# Patient Record
Sex: Female | Born: 1945 | Race: Black or African American | Hispanic: No | Marital: Married | Smoking: Never smoker
Health system: Southern US, Community
[De-identification: ages and names within clinical notes are randomized; demographics above are authoritative.]

## PROBLEM LIST (undated history)

## (undated) DIAGNOSIS — J069 Acute upper respiratory infection, unspecified: Secondary | ICD-10-CM

## (undated) DIAGNOSIS — Z9989 Dependence on other enabling machines and devices: Secondary | ICD-10-CM

## (undated) DIAGNOSIS — K469 Unspecified abdominal hernia without obstruction or gangrene: Secondary | ICD-10-CM

## (undated) DIAGNOSIS — J383 Other diseases of vocal cords: Secondary | ICD-10-CM

## (undated) DIAGNOSIS — D649 Anemia, unspecified: Secondary | ICD-10-CM

## (undated) DIAGNOSIS — F419 Anxiety disorder, unspecified: Secondary | ICD-10-CM

## (undated) DIAGNOSIS — E119 Type 2 diabetes mellitus without complications: Secondary | ICD-10-CM

## (undated) DIAGNOSIS — U071 COVID-19: Secondary | ICD-10-CM

## (undated) DIAGNOSIS — E785 Hyperlipidemia, unspecified: Secondary | ICD-10-CM

## (undated) DIAGNOSIS — E079 Disorder of thyroid, unspecified: Secondary | ICD-10-CM

## (undated) DIAGNOSIS — K219 Gastro-esophageal reflux disease without esophagitis: Secondary | ICD-10-CM

## (undated) DIAGNOSIS — J45909 Unspecified asthma, uncomplicated: Secondary | ICD-10-CM

## (undated) DIAGNOSIS — G4733 Obstructive sleep apnea (adult) (pediatric): Secondary | ICD-10-CM

## (undated) DIAGNOSIS — I509 Heart failure, unspecified: Secondary | ICD-10-CM

## (undated) DIAGNOSIS — I10 Essential (primary) hypertension: Secondary | ICD-10-CM

## (undated) HISTORY — PX: BREAST RECONSTRUCTION: SHX9

## (undated) HISTORY — DX: Gastro-esophageal reflux disease without esophagitis: K21.9

## (undated) HISTORY — DX: COVID-19: U07.1

## (undated) HISTORY — PX: HERNIA REPAIR: SHX51

## (undated) HISTORY — PX: BREAST SURGERY: SHX581

## (undated) HISTORY — DX: Disorder of thyroid, unspecified: E07.9

## (undated) HISTORY — DX: Hyperlipidemia, unspecified: E78.5

## (undated) HISTORY — DX: Unspecified abdominal hernia without obstruction or gangrene: K46.9

## (undated) HISTORY — PX: OTHER SURGICAL HISTORY: SHX169

## (undated) HISTORY — PX: NISSEN FUNDOPLICATION: SHX2091

## (undated) HISTORY — DX: Dependence on other enabling machines and devices: Z99.89

## (undated) HISTORY — DX: Heart failure, unspecified: I50.9

## (undated) HISTORY — DX: Unspecified asthma, uncomplicated: J45.909

## (undated) HISTORY — PX: DILATION AND CURETTAGE OF UTERUS: SHX78

## (undated) HISTORY — PX: ENDOMETRIAL BIOPSY: SHX622

## (undated) HISTORY — PX: HYSTEROSCOPY: SHX211

## (undated) HISTORY — DX: Anxiety disorder, unspecified: F41.9

## (undated) HISTORY — DX: Anemia, unspecified: D64.9

## (undated) HISTORY — DX: Obstructive sleep apnea (adult) (pediatric): G47.33

## (undated) HISTORY — DX: Other diseases of vocal cords: J38.3

## (undated) HISTORY — PX: KNEE SURGERY: SHX244

## (undated) HISTORY — PX: OOPHORECTOMY: SHX86

## (undated) HISTORY — DX: Acute upper respiratory infection, unspecified: J06.9

## (undated) HISTORY — DX: Essential (primary) hypertension: I10

---

## 1998-10-13 ENCOUNTER — Encounter: Payer: Self-pay | Admitting: Obstetrics and Gynecology

## 1998-10-13 ENCOUNTER — Other Ambulatory Visit: Admission: RE | Admit: 1998-10-13 | Discharge: 1998-10-13 | Payer: Self-pay | Admitting: Obstetrics and Gynecology

## 1998-10-13 ENCOUNTER — Ambulatory Visit (HOSPITAL_COMMUNITY): Admission: RE | Admit: 1998-10-13 | Discharge: 1998-10-13 | Payer: Self-pay | Admitting: Obstetrics and Gynecology

## 1999-10-05 ENCOUNTER — Other Ambulatory Visit: Admission: RE | Admit: 1999-10-05 | Discharge: 1999-10-05 | Payer: Self-pay | Admitting: Obstetrics and Gynecology

## 1999-12-01 ENCOUNTER — Ambulatory Visit (HOSPITAL_COMMUNITY): Admission: RE | Admit: 1999-12-01 | Discharge: 1999-12-01 | Payer: Self-pay | Admitting: Obstetrics and Gynecology

## 1999-12-01 ENCOUNTER — Encounter: Payer: Self-pay | Admitting: Obstetrics and Gynecology

## 1999-12-15 ENCOUNTER — Encounter (INDEPENDENT_AMBULATORY_CARE_PROVIDER_SITE_OTHER): Payer: Self-pay | Admitting: Specialist

## 1999-12-15 ENCOUNTER — Other Ambulatory Visit: Admission: RE | Admit: 1999-12-15 | Discharge: 1999-12-15 | Payer: Self-pay | Admitting: Obstetrics and Gynecology

## 2000-11-23 ENCOUNTER — Ambulatory Visit (HOSPITAL_COMMUNITY): Admission: RE | Admit: 2000-11-23 | Discharge: 2000-11-23 | Payer: Self-pay | Admitting: Family Medicine

## 2000-12-13 ENCOUNTER — Encounter: Admission: RE | Admit: 2000-12-13 | Discharge: 2000-12-13 | Payer: Self-pay | Admitting: Obstetrics and Gynecology

## 2000-12-13 ENCOUNTER — Other Ambulatory Visit: Admission: RE | Admit: 2000-12-13 | Discharge: 2000-12-13 | Payer: Self-pay | Admitting: Obstetrics and Gynecology

## 2000-12-13 ENCOUNTER — Encounter: Payer: Self-pay | Admitting: Obstetrics and Gynecology

## 2001-12-11 ENCOUNTER — Other Ambulatory Visit: Admission: RE | Admit: 2001-12-11 | Discharge: 2001-12-11 | Payer: Self-pay | Admitting: Obstetrics and Gynecology

## 2001-12-13 ENCOUNTER — Encounter: Admission: RE | Admit: 2001-12-13 | Discharge: 2001-12-13 | Payer: Self-pay | Admitting: Obstetrics and Gynecology

## 2001-12-13 ENCOUNTER — Encounter: Payer: Self-pay | Admitting: Obstetrics and Gynecology

## 2003-01-22 ENCOUNTER — Other Ambulatory Visit: Admission: RE | Admit: 2003-01-22 | Discharge: 2003-01-22 | Payer: Self-pay | Admitting: Obstetrics and Gynecology

## 2003-01-22 ENCOUNTER — Encounter: Admission: RE | Admit: 2003-01-22 | Discharge: 2003-01-22 | Payer: Self-pay | Admitting: Obstetrics and Gynecology

## 2003-01-22 ENCOUNTER — Encounter: Payer: Self-pay | Admitting: Obstetrics and Gynecology

## 2003-09-16 ENCOUNTER — Encounter (INDEPENDENT_AMBULATORY_CARE_PROVIDER_SITE_OTHER): Payer: Self-pay | Admitting: Specialist

## 2003-09-16 ENCOUNTER — Ambulatory Visit (HOSPITAL_COMMUNITY): Admission: RE | Admit: 2003-09-16 | Discharge: 2003-09-16 | Payer: Self-pay | Admitting: Obstetrics and Gynecology

## 2004-01-24 ENCOUNTER — Encounter: Admission: RE | Admit: 2004-01-24 | Discharge: 2004-01-24 | Payer: Self-pay | Admitting: Obstetrics and Gynecology

## 2004-01-28 ENCOUNTER — Other Ambulatory Visit: Admission: RE | Admit: 2004-01-28 | Discharge: 2004-01-28 | Payer: Self-pay | Admitting: Obstetrics and Gynecology

## 2004-04-10 ENCOUNTER — Encounter: Payer: Self-pay | Admitting: Internal Medicine

## 2004-05-01 ENCOUNTER — Encounter (INDEPENDENT_AMBULATORY_CARE_PROVIDER_SITE_OTHER): Payer: Self-pay | Admitting: Specialist

## 2004-05-01 ENCOUNTER — Ambulatory Visit: Payer: Self-pay | Admitting: Internal Medicine

## 2004-05-01 ENCOUNTER — Ambulatory Visit (HOSPITAL_COMMUNITY): Admission: RE | Admit: 2004-05-01 | Discharge: 2004-05-01 | Payer: Self-pay | Admitting: Internal Medicine

## 2005-02-02 ENCOUNTER — Encounter: Admission: RE | Admit: 2005-02-02 | Discharge: 2005-02-02 | Payer: Self-pay | Admitting: Obstetrics and Gynecology

## 2005-02-08 ENCOUNTER — Other Ambulatory Visit: Admission: RE | Admit: 2005-02-08 | Discharge: 2005-02-08 | Payer: Self-pay | Admitting: Obstetrics and Gynecology

## 2006-01-10 ENCOUNTER — Encounter: Admission: RE | Admit: 2006-01-10 | Discharge: 2006-01-10 | Payer: Self-pay | Admitting: Obstetrics and Gynecology

## 2006-03-08 ENCOUNTER — Other Ambulatory Visit: Admission: RE | Admit: 2006-03-08 | Discharge: 2006-03-08 | Payer: Self-pay | Admitting: Obstetrics and Gynecology

## 2007-02-08 ENCOUNTER — Encounter: Admission: RE | Admit: 2007-02-08 | Discharge: 2007-02-08 | Payer: Self-pay | Admitting: Obstetrics and Gynecology

## 2007-03-15 ENCOUNTER — Other Ambulatory Visit: Admission: RE | Admit: 2007-03-15 | Discharge: 2007-03-15 | Payer: Self-pay | Admitting: Obstetrics and Gynecology

## 2007-03-28 DIAGNOSIS — G4733 Obstructive sleep apnea (adult) (pediatric): Secondary | ICD-10-CM

## 2007-03-28 HISTORY — DX: Obstructive sleep apnea (adult) (pediatric): G47.33

## 2007-11-07 ENCOUNTER — Encounter: Admission: RE | Admit: 2007-11-07 | Discharge: 2007-11-07 | Payer: Self-pay | Admitting: Family Medicine

## 2007-12-15 ENCOUNTER — Emergency Department (HOSPITAL_COMMUNITY): Admission: EM | Admit: 2007-12-15 | Discharge: 2007-12-15 | Payer: Self-pay | Admitting: Emergency Medicine

## 2008-02-09 ENCOUNTER — Encounter: Admission: RE | Admit: 2008-02-09 | Discharge: 2008-02-09 | Payer: Self-pay | Admitting: Obstetrics and Gynecology

## 2008-03-20 ENCOUNTER — Other Ambulatory Visit: Admission: RE | Admit: 2008-03-20 | Discharge: 2008-03-20 | Payer: Self-pay | Admitting: Obstetrics and Gynecology

## 2008-03-20 ENCOUNTER — Encounter: Payer: Self-pay | Admitting: Obstetrics and Gynecology

## 2008-03-20 ENCOUNTER — Ambulatory Visit: Payer: Self-pay | Admitting: Obstetrics and Gynecology

## 2008-04-01 ENCOUNTER — Ambulatory Visit: Payer: Self-pay | Admitting: Obstetrics and Gynecology

## 2008-04-16 ENCOUNTER — Ambulatory Visit: Payer: Self-pay | Admitting: Obstetrics and Gynecology

## 2008-05-13 ENCOUNTER — Ambulatory Visit: Payer: Self-pay | Admitting: Obstetrics and Gynecology

## 2008-06-27 ENCOUNTER — Ambulatory Visit: Payer: Self-pay | Admitting: Obstetrics and Gynecology

## 2008-11-13 ENCOUNTER — Ambulatory Visit: Payer: Self-pay | Admitting: Women's Health

## 2009-01-14 ENCOUNTER — Ambulatory Visit: Payer: Self-pay | Admitting: Obstetrics and Gynecology

## 2009-01-20 ENCOUNTER — Ambulatory Visit: Payer: Self-pay | Admitting: Obstetrics and Gynecology

## 2009-02-17 ENCOUNTER — Encounter: Admission: RE | Admit: 2009-02-17 | Discharge: 2009-02-17 | Payer: Self-pay | Admitting: Internal Medicine

## 2009-03-24 ENCOUNTER — Encounter: Payer: Self-pay | Admitting: Obstetrics and Gynecology

## 2009-03-24 ENCOUNTER — Other Ambulatory Visit: Admission: RE | Admit: 2009-03-24 | Discharge: 2009-03-24 | Payer: Self-pay | Admitting: Obstetrics and Gynecology

## 2009-03-24 ENCOUNTER — Ambulatory Visit: Payer: Self-pay | Admitting: Obstetrics and Gynecology

## 2009-04-07 ENCOUNTER — Encounter (INDEPENDENT_AMBULATORY_CARE_PROVIDER_SITE_OTHER): Payer: Self-pay | Admitting: *Deleted

## 2009-04-10 ENCOUNTER — Telehealth: Payer: Self-pay | Admitting: Internal Medicine

## 2009-04-14 ENCOUNTER — Encounter (INDEPENDENT_AMBULATORY_CARE_PROVIDER_SITE_OTHER): Payer: Self-pay

## 2009-05-02 ENCOUNTER — Encounter (INDEPENDENT_AMBULATORY_CARE_PROVIDER_SITE_OTHER): Payer: Self-pay | Admitting: *Deleted

## 2009-05-02 ENCOUNTER — Ambulatory Visit: Payer: Self-pay | Admitting: Internal Medicine

## 2009-05-09 ENCOUNTER — Ambulatory Visit: Payer: Self-pay | Admitting: Internal Medicine

## 2009-05-09 ENCOUNTER — Ambulatory Visit (HOSPITAL_COMMUNITY): Admission: RE | Admit: 2009-05-09 | Discharge: 2009-05-09 | Payer: Self-pay | Admitting: Internal Medicine

## 2009-05-20 ENCOUNTER — Encounter: Payer: Self-pay | Admitting: Internal Medicine

## 2009-07-24 ENCOUNTER — Ambulatory Visit: Payer: Self-pay | Admitting: Obstetrics and Gynecology

## 2010-01-08 ENCOUNTER — Ambulatory Visit: Payer: Self-pay | Admitting: Obstetrics and Gynecology

## 2010-03-17 ENCOUNTER — Encounter: Admission: RE | Admit: 2010-03-17 | Discharge: 2010-03-17 | Payer: Self-pay | Admitting: Obstetrics and Gynecology

## 2010-03-25 ENCOUNTER — Ambulatory Visit: Payer: Self-pay | Admitting: Obstetrics and Gynecology

## 2010-03-30 ENCOUNTER — Ambulatory Visit: Payer: Self-pay | Admitting: Obstetrics and Gynecology

## 2010-07-27 ENCOUNTER — Ambulatory Visit: Payer: Medicare Other | Admitting: Gynecology

## 2010-07-27 DIAGNOSIS — N898 Other specified noninflammatory disorders of vagina: Secondary | ICD-10-CM

## 2010-07-27 DIAGNOSIS — B3731 Acute candidiasis of vulva and vagina: Secondary | ICD-10-CM

## 2010-07-27 DIAGNOSIS — K644 Residual hemorrhoidal skin tags: Secondary | ICD-10-CM

## 2010-07-27 DIAGNOSIS — B373 Candidiasis of vulva and vagina: Secondary | ICD-10-CM

## 2010-08-10 ENCOUNTER — Telehealth: Payer: Self-pay | Admitting: Internal Medicine

## 2010-08-18 NOTE — Progress Notes (Signed)
Summary: Abd Pain  Phone Note Call from Patient Call back at Home Phone [phone] Call back at cell [phone]   Call For: Dr Leone Payor Reason for Call: Talk to Nurse Summary of Call: Having abd pain & wonders if she can be see sooner that first avail 09-18-10. Initial call taken by: Leanor Kail [facility],  August 10, 2010 1:56 PM  Follow-up for Phone Call        patient is having continued problems with constipation.  She is taking laxatives with minimal results and daily abdominal pain.  She is rescehduled to see Dr Leone Payor for 08/26/10 Follow-up by: Darcey Nora RN, CGRN,  August 10, 2010 3:35 PM

## 2010-08-24 DIAGNOSIS — Z860100 Personal history of colon polyps, unspecified: Secondary | ICD-10-CM | POA: Insufficient documentation

## 2010-08-26 ENCOUNTER — Ambulatory Visit (INDEPENDENT_AMBULATORY_CARE_PROVIDER_SITE_OTHER): Payer: Medicare Other | Admitting: Internal Medicine

## 2010-08-26 ENCOUNTER — Encounter: Payer: Self-pay | Admitting: Internal Medicine

## 2010-08-26 DIAGNOSIS — R141 Gas pain: Secondary | ICD-10-CM | POA: Insufficient documentation

## 2010-08-26 DIAGNOSIS — R159 Full incontinence of feces: Secondary | ICD-10-CM

## 2010-08-26 DIAGNOSIS — R142 Eructation: Secondary | ICD-10-CM

## 2010-08-26 DIAGNOSIS — R143 Flatulence: Secondary | ICD-10-CM

## 2010-08-26 DIAGNOSIS — K5909 Other constipation: Secondary | ICD-10-CM

## 2010-09-01 NOTE — Assessment & Plan Note (Signed)
Summary: ABD PAIN/SCHED W-PT/LAST ROV 2005/MAILER SENT./CX FEE   History of Present Illness Visit Type: new patient  Primary GI MD: Stan Head MD [facility] Primary Provider: Gwenyth Bender, MD  Requesting Provider: na Chief Complaint: GERD  History of Present Illness:   65 yo African-American woman with inreased flatulence and a protrusion from the anal area noticed when she wipes. She also notices some seepage of stool when she laughs or walks. No urinary incontinence. She uses a stool softener to help with constipation (chronic) and stools are soft. Symptoms x 5 years  She eats a large amount of vegetables (cauliflower, cabbage, broccoli, peppers).  Using MiraLax has used fiber (Benefiber caused cramps) has used cit Mg also uses align   GI Review of Systems    Reports acid reflux and  bloating.      Denies abdominal pain, belching, chest pain, dysphagia with liquids, dysphagia with solids, loss of appetite, nausea, vomiting, vomiting blood, weight loss, and  weight gain.      Reports constipation and  rectal pain.     Denies anal fissure, black tarry stools, change in bowel habit, diarrhea, diverticulosis, fecal incontinence, heme positive stool, hemorrhoids, irritable bowel syndrome, jaundice, light color stool, liver problems, and  rectal bleeding.    Colonoscopy  Procedure date:  04/29/2009  Findings:       1) Four polyps removed with cold biopsy and snare. Largest was 6     mm. ADENOMAS     2) Otherwise normal examination, good prep     3) prior diminutive adenoma 2005  Comments:      Repeat colonoscopy in 3 years.      Current Medications (verified): 1)  Lanoxin 0.125 Mg Tabs (Digoxin) .... One Tablet By Mouth Once Daily 2)  Coreg Cr 40 Mg Xr24h-Cap (Carvedilol Phosphate) .... One Capsule By Mouth Once Daily 3)  Furosemide 40 Mg Tabs (Furosemide) .Marland Kitchen.. 1 Tablet By Mouth Once Daily and 1 1/2 Tablets By Mouth At Bedtime 4)  Spironolactone 25 Mg Tabs (Spironolactone)  .... 1/2 Tablet Two Times A Day By Mouth 5)  Diovan 160 Mg Tabs (Valsartan) .... One Tablet By Mouth Two Times A Day 6)  Nexium 40 Mg Cpdr (Esomeprazole Magnesium) .... One Tablet By Mouth Two Times A Day 7)  Lipitor 40 Mg Tabs (Atorvastatin Calcium) .... Two Tablet By Mouth Once Daily 8)  Zolpidem Tartrate 10 Mg Tabs (Zolpidem Tartrate) .... As Needed 9)  Singulair 10 Mg Tabs (Montelukast Sodium) .... Once Daily By Mouth 10)  Docusate Sodium 100 Mg Tabs (Docusate Sodium) .... One Tablet Three Times A Day By Mouth 11)  C-Pap .... As Needed 12)  Xyzal 5 Mg Tabs (Levocetirizine Dihydrochloride) .... One Tablet By Mouth Once Daily 13)  Oscal 500/200 D-3 500-200 Mg-Unit Tabs (Calcium Carbonate-Vitamin D) .... One Tablet By Mouth Once Daily 14)  Centrum Silver  Tabs (Multiple Vitamins-Minerals) .... One Tablet By Mouth Once Daily 15)  Vitamin D3 1000 Unit  Tabs (Cholecalciferol) .... One Tablet By Mouth Once Daily 16)  Vitamin B-12 1000 Mcg Tabs (Cyanocobalamin) .... One Tablet By Mouth Once Daily 17)  Protein Shake .... Once Daily  Allergies (verified): 1)  ! Sulfa 2)  ! Codeine 3)  ! Versed  Past History:  Past Medical History: Irritable Bowel Syndrome Umbilical hernia Adenomatous polyps 2005, 2010 GERD Hypertension Hyperlipidemia  Past Surgical History: Nissenfundoplication @ [facility] Vocal Cord Surgery  Thyroid Surgery Umbilical hernia repair  Family History: Family History of Prostate Cancer:  father No FH of Colon Cancer:  Social History: Retired Married Children Patient has never smoked.  Alcohol Use - no Illicit Drug Use - no Smoking Status:  never Drug Use:  no  Review of Systems       The patient complains of allergy/sinus, arthritis/joint pain, back pain, fatigue, heart murmur, and shortness of breath.         + anxiety also All other ROS negative except as per HPI.   Vital Signs:  Patient profile:   65 year old female Height:      65 inches Weight:       244 pounds BMI:     40.75 BSA:     2.15 Pulse rate:   72 / minute Pulse rhythm:   regular BP sitting:   132 / 78  (left arm) Cuff size:   regular  Vitals Entered By: Ok Anis CMA (August 26, 2010 1:49 PM)  Physical Exam  General:  obese, NAD Eyes:  anicteric Lungs:  Clear throughout to auscultation. Heart:  Regular rate and rhythm; no murmurs, rubs,  or bruits. Abdomen:  obese, soft and nontender BS+ healed scars Rectal:  FEMALE STAFF PRESENT small anterior tag decreased sphincter tone at rest (mild) intact anal wink normal squeeze and valsalva   Impression & Recommendations:  Problem # 1:  INTESTINAL GAS (ICD-787.3) Assessment New Seems like diet rich in gas-producing vegetables is playing a significant role. will have her follow gas reduction diet and add fiber supplement with MiraLax to reduce constipation problems. ? some  fundoplication effect but doubt that is primary as many years out from that and she does not remember gas bloat after that. Certainly could add to flatulence as opposed to belching. Note she has tried Librarian, academic intermittently and could do again. also uses Beano.  If diet changes unhelpful consider empiric antibiotic tx  Problem # 2:  FULL INCONTINENCE OF FECES (ICD-787.60) Assessment: New lax sphincter probably related to 4 births some with trauma small rectocele also see if metamucil supplementation and other diet changes don't help  Problem # 3:  CONSTIPATION, CHRONIC (ICD-564.09) Assessment: New stool softeners and Miralax used now. Try fiber and <iraLax while reducing cruciferous vegerables and other gas-forming foods  Patient Instructions: 1)  Take 1 teaspoon of metamucil daily x 1 week, then 2 teaspoons daily x 1 week then 3 teaspoons (1 tablespoon) daiy. May adjust dose as needed, up or down. 2)  Excessive Gas Diet handout given.  3)  Please schedule a follow-up appointment as needed, if the diet does not work.  4)  The medication list  was reviewed and reconciled.  All changed / newly prescribed medications were explained.  A complete medication list was provided to the patient / caregiver. cc Willey Blade, MD

## 2010-09-01 NOTE — Consult Note (Signed)
Summary: [facility]   Imported By: Sherian Rein 08/24/2010 13:13:41  _____________________________________________________________________  External Attachment:    Type:   Image     Comment:   External Document

## 2010-09-18 ENCOUNTER — Ambulatory Visit: Payer: Medicare Other | Admitting: Internal Medicine

## 2010-09-30 ENCOUNTER — Other Ambulatory Visit: Payer: Medicare Other

## 2010-09-30 ENCOUNTER — Ambulatory Visit (INDEPENDENT_AMBULATORY_CARE_PROVIDER_SITE_OTHER): Payer: Medicare Other | Admitting: Obstetrics and Gynecology

## 2010-09-30 DIAGNOSIS — D259 Leiomyoma of uterus, unspecified: Secondary | ICD-10-CM

## 2010-09-30 DIAGNOSIS — N852 Hypertrophy of uterus: Secondary | ICD-10-CM

## 2010-09-30 DIAGNOSIS — D251 Intramural leiomyoma of uterus: Secondary | ICD-10-CM

## 2010-09-30 DIAGNOSIS — N949 Unspecified condition associated with female genital organs and menstrual cycle: Secondary | ICD-10-CM

## 2010-11-06 NOTE — Op Note (Signed)
NAME:  Jamie Ayers, Jamie Ayers                         ACCOUNT NO.:  [id number]   MEDICAL RECORD NO.:  [id number]                   PATIENT TYPE:  AMB   LOCATION:  SDC                                  FACILITY:  [facility]   PHYSICIAN:  Daniel L. Eda Paschal, M.D.           DATE OF BIRTH:  1945-11-24   DATE OF PROCEDURE:  09/16/2003  DATE OF DISCHARGE:                                 OPERATIVE REPORT   PREOPERATIVE DIAGNOSIS:  Cervical growth.   POSTOPERATIVE DIAGNOSIS:  Cervical mucousy polyp and endometrial polyp.   OPERATION:  1. Excision of cervical polyp.  2. Hysteroscopy with excision of endometrial polyp.   SURGEON:  Daniel L. Eda Paschal, M.D.   ANESTHESIA:  Subarachnoid block.   FINDINGS:  At the time of surgery, the patient had a growth coming out of  her cervix.  It appeared to be coming from the mid portion of the internal  part of the cervix, about half way between the external and internal os.  As  it was excised, it drained some mucousy material, making it consistent with  a mucousy polyp.  At the time that it was removed and hysteroscopy was then  done to be sure that she did not have associated polyps in the uterus,  examination of the endometrial cavity with the hysteroscope revealed a small  polyp coming off at 12 o'clock, of about 0.5 cm.  Other than that,  examination of the entire endometrial cavity and endocervical cavity (once  the polyp had been removed) was entirely normal.   DESCRIPTION OF PROCEDURE:  The patient was taken to the operating room and  because of difficulty with intubation, she was given a subarachnoid block by  anesthesia.  Anesthesia was excellent.  She was then prepped and draped in  the usual sterile manner.  A single-toothed tenaculum was placed in the  anterior lip of the cervix.  An Allis clamp was placed on this polypoid  growth coming out of the os.  It was felt that it was coming from the mid  portion of the cervix.   Using a 0 Vicryl Endo  loop for hemostasis, this was placed around the base  of this and then the polyp was removed.  As the polyp was excised, some  mucousy material came out of it.  After this, the cervix was dilated to a  #25 Pratt dilator, and a hysteroscopic examination was done with the  diagnostic hysteroscope (using 3% Sorbitol to expand the intrauterine  cavity, and a camera for magnification.  There was a small polyp at 12  o'clock, so the cervix was then dilated to a #31 Pratt dilator.  A  hysteroscopic resectoscope was introduced, with a 90-degree wire loop;  settings were 70 coag 110 cutting, blend 1 -- and the polyp was excised and  sent to pathology for tissue diagnosis.   Bleeding for the entire procedure was minimal.  She did not  bleed from the  base where the 0 Vicryl Endo loop had been placed.  Fluid deficit appeared  to be excessive; it was 500 cc.  There was no evidence of a perforation, and  at  the termination of the procedure the patient was lucid without any evidence  that she was getting intravenous 3% Sorbitol.   She will be watched carefully in the recovery room.  She left the operating  room in satisfactory condition.                                               Daniel L. Eda Paschal, M.D.    Tonette Bihari  D:  09/16/2003  T:  09/16/2003  Job:  [id number]

## 2010-12-29 ENCOUNTER — Ambulatory Visit (INDEPENDENT_AMBULATORY_CARE_PROVIDER_SITE_OTHER): Payer: Medicare Other | Admitting: Obstetrics and Gynecology

## 2010-12-29 ENCOUNTER — Other Ambulatory Visit: Payer: Self-pay | Admitting: Obstetrics and Gynecology

## 2010-12-29 DIAGNOSIS — N951 Menopausal and female climacteric states: Secondary | ICD-10-CM

## 2010-12-29 DIAGNOSIS — B373 Candidiasis of vulva and vagina: Secondary | ICD-10-CM

## 2010-12-29 DIAGNOSIS — N898 Other specified noninflammatory disorders of vagina: Secondary | ICD-10-CM

## 2010-12-30 ENCOUNTER — Ambulatory Visit: Payer: Medicare Other | Admitting: Obstetrics and Gynecology

## 2011-02-09 ENCOUNTER — Other Ambulatory Visit: Payer: Self-pay | Admitting: Internal Medicine

## 2011-02-09 DIAGNOSIS — N644 Mastodynia: Secondary | ICD-10-CM

## 2011-02-15 ENCOUNTER — Ambulatory Visit
Admission: RE | Admit: 2011-02-15 | Discharge: 2011-02-15 | Disposition: A | Discharge status: Home / Self Care | Payer: Medicare Other | Source: Ambulatory Visit | Attending: Internal Medicine | Admitting: Internal Medicine

## 2011-02-15 DIAGNOSIS — N644 Mastodynia: Secondary | ICD-10-CM

## 2011-03-05 ENCOUNTER — Encounter: Payer: Self-pay | Admitting: Gynecology

## 2011-03-05 DIAGNOSIS — D219 Benign neoplasm of connective and other soft tissue, unspecified: Secondary | ICD-10-CM | POA: Insufficient documentation

## 2011-03-05 DIAGNOSIS — N841 Polyp of cervix uteri: Secondary | ICD-10-CM | POA: Insufficient documentation

## 2011-03-23 ENCOUNTER — Ambulatory Visit (INDEPENDENT_AMBULATORY_CARE_PROVIDER_SITE_OTHER): Payer: Medicare Other | Admitting: Obstetrics and Gynecology

## 2011-03-23 ENCOUNTER — Encounter: Payer: Self-pay | Admitting: Obstetrics and Gynecology

## 2011-03-23 ENCOUNTER — Other Ambulatory Visit (HOSPITAL_COMMUNITY)
Admission: RE | Admit: 2011-03-23 | Discharge: 2011-03-23 | Disposition: A | Discharge status: Home / Self Care | Payer: Medicare Other | Source: Ambulatory Visit | Attending: Obstetrics and Gynecology | Admitting: Obstetrics and Gynecology

## 2011-03-23 VITALS — BP 140/80 | Ht 64.0 in | Wt 240.0 lb

## 2011-03-23 DIAGNOSIS — Z124 Encounter for screening for malignant neoplasm of cervix: Secondary | ICD-10-CM | POA: Insufficient documentation

## 2011-03-23 DIAGNOSIS — J381 Polyp of vocal cord and larynx: Secondary | ICD-10-CM

## 2011-03-23 DIAGNOSIS — N950 Postmenopausal bleeding: Secondary | ICD-10-CM

## 2011-03-23 DIAGNOSIS — N949 Unspecified condition associated with female genital organs and menstrual cycle: Secondary | ICD-10-CM

## 2011-03-23 DIAGNOSIS — R102 Pelvic and perineal pain: Secondary | ICD-10-CM

## 2011-03-23 DIAGNOSIS — D259 Leiomyoma of uterus, unspecified: Secondary | ICD-10-CM

## 2011-03-23 DIAGNOSIS — D219 Benign neoplasm of connective and other soft tissue, unspecified: Secondary | ICD-10-CM

## 2011-03-23 NOTE — Progress Notes (Signed)
Subjective:     Patient ID: Jamie Ayers, female   DOB: [birth year], 65 y.o.   MRN: [id number]  HPIpatient came back to see me today for further followup. She's had no more vaginal bleeding since her evaluation previously. Her endometrial biopsy was normal. We think the bleeding was related to her steroids injection. She continues to get lower abdominal pain. It has not changed. In April her ultrasound still revealed multiple fibroids with no ovarian pathology. She is up-to-date on both mammograms and bone densities.   Review of Systems  Constitutional: Negative.   HENT:       Recurrent vocal cord polyps-papillomas  Eyes: Negative.   Respiratory: Negative.   Cardiovascular:       History of cardiac arrest. History of congestive heart failure. History of hypertension and elevated cholesterol.  Gastrointestinal: Positive for abdominal pain and constipation.       History of slight rectal incontinence associated with flatus. GERD  Genitourinary: Negative.   Musculoskeletal: Negative.   Skin: Negative.   Neurological: Negative.   Hematological: Negative.   Psychiatric/Behavioral: Negative.        Objective:   Physical ExamPhysical examination: HEENT within normal limits. Neck: Thyroid not large. No masses. Supraclavicular nodes: not enlarged. Breasts: Examined in both sitting midline position. No skin changes and no masses. Abdomen: Soft no guarding rebound or masses or hernia. Pelvic: External: Within normal limits. BUS: Within normal limits. Vaginal:within normal limits. Good estrogen effect. No evidence of cystocele rectocele or enterocele. Cervix: clean. Uterus: enlarged by fibroids to 8-9 weeks size. Adnexa: No masses. Rectovaginal exam: Confirmatory and negative. Extremities: Within normal limits.     Assessment:     1. Fibroids with pelvic pain 2. Postmenopausal bleeding 3.vocal cord polyps making intubation difficult    Plan:     Patient will continue yearly mammograms  and periodic bone densities. I tried to reassure the patient about the fibroids and malignancy. I told her I thought that if she did want surgery to resolve all of this she should see Dr. Mia Creek at [facility] who could do it robotically with the presence of the people who can intubate her properly.

## 2011-04-09 ENCOUNTER — Telehealth: Payer: Self-pay | Admitting: *Deleted

## 2011-04-09 NOTE — Telephone Encounter (Signed)
Had faxed records to Dr. Mart Piggs office for an appointment and they called back and said they would see her on Monday 04/12/11 and that they had already contacted the patient.

## 2011-06-22 HISTORY — PX: ABDOMINAL HYSTERECTOMY: SHX81

## 2011-06-24 ENCOUNTER — Ambulatory Visit (INDEPENDENT_AMBULATORY_CARE_PROVIDER_SITE_OTHER): Payer: Medicare Other | Admitting: Obstetrics and Gynecology

## 2011-06-24 ENCOUNTER — Other Ambulatory Visit (HOSPITAL_COMMUNITY)
Admission: RE | Admit: 2011-06-24 | Discharge: 2011-06-24 | Disposition: A | Discharge status: Home / Self Care | Payer: Medicare Other | Source: Ambulatory Visit | Attending: Obstetrics and Gynecology | Admitting: Obstetrics and Gynecology

## 2011-06-24 DIAGNOSIS — N6459 Other signs and symptoms in breast: Secondary | ICD-10-CM

## 2011-06-24 DIAGNOSIS — N6452 Nipple discharge: Secondary | ICD-10-CM

## 2011-06-24 NOTE — Progress Notes (Signed)
Patient came in today with a short history of a clear discharge from her left nipple. There's been really no change in her medication. She can feel no lumps. She is up-to-date on mammograms. Her last mammogram was in August at the breast Center.  Exam: Jamie Ayers present. Both breasts were carefully examined and there are no masses. She has obvious clear discharge coming from her left breast.  Assessment: New clear discharge left nipple  Plan: Cytology done on discharge. I will discuss case with the breast Center after I get report back.

## 2011-06-29 ENCOUNTER — Other Ambulatory Visit: Payer: Self-pay | Admitting: *Deleted

## 2011-06-29 ENCOUNTER — Telehealth: Payer: Self-pay | Admitting: *Deleted

## 2011-06-29 DIAGNOSIS — N6452 Nipple discharge: Secondary | ICD-10-CM

## 2011-06-29 NOTE — Telephone Encounter (Signed)
Message copied by Libby Maw on Tue Jun 29, 2011 11:43 AM ------      Message from: Trellis Paganini      Created: Tue Jun 29, 2011  6:51 AM       Tell pt cytology on nipple discharge was fine but I discussed with surgeon(Dr. Jamey Ripa) and he thinks patient needs ductogram and diagnostic mammogram of left breast. Please schedule at [facility].

## 2011-06-29 NOTE — Telephone Encounter (Signed)
Patient informed appt set at the [facility] for 07/06/11 @ 2:20pm.

## 2011-07-06 ENCOUNTER — Ambulatory Visit
Admission: RE | Admit: 2011-07-06 | Discharge: 2011-07-06 | Disposition: A | Discharge status: Home / Self Care | Payer: Medicare Other | Source: Ambulatory Visit | Attending: Obstetrics and Gynecology | Admitting: Obstetrics and Gynecology

## 2011-07-06 ENCOUNTER — Ambulatory Visit
Admission: RE | Admit: 2011-07-06 | Discharge: 2011-07-06 | Disposition: A | Discharge status: Home / Self Care | Source: Ambulatory Visit | Attending: Obstetrics and Gynecology | Admitting: Obstetrics and Gynecology

## 2011-07-06 ENCOUNTER — Other Ambulatory Visit: Payer: Self-pay | Admitting: Obstetrics and Gynecology

## 2011-07-06 DIAGNOSIS — N6452 Nipple discharge: Secondary | ICD-10-CM

## 2011-07-27 ENCOUNTER — Ambulatory Visit
Admission: RE | Admit: 2011-07-27 | Discharge: 2011-07-27 | Disposition: A | Discharge status: Home / Self Care | Source: Ambulatory Visit | Attending: Obstetrics and Gynecology | Admitting: Obstetrics and Gynecology

## 2011-07-27 ENCOUNTER — Other Ambulatory Visit: Payer: Self-pay | Admitting: Obstetrics and Gynecology

## 2011-07-27 DIAGNOSIS — Z1231 Encounter for screening mammogram for malignant neoplasm of breast: Secondary | ICD-10-CM

## 2011-07-27 DIAGNOSIS — N6452 Nipple discharge: Secondary | ICD-10-CM

## 2011-07-27 DIAGNOSIS — N6019 Diffuse cystic mastopathy of unspecified breast: Secondary | ICD-10-CM

## 2011-07-29 ENCOUNTER — Ambulatory Visit
Admission: RE | Admit: 2011-07-29 | Discharge: 2011-07-29 | Disposition: A | Discharge status: Home / Self Care | Payer: Medicare Other | Source: Ambulatory Visit | Attending: Obstetrics and Gynecology | Admitting: Obstetrics and Gynecology

## 2011-07-29 DIAGNOSIS — N6019 Diffuse cystic mastopathy of unspecified breast: Secondary | ICD-10-CM

## 2011-08-06 DIAGNOSIS — I10 Essential (primary) hypertension: Secondary | ICD-10-CM

## 2011-08-06 HISTORY — DX: Essential (primary) hypertension: I10

## 2011-08-11 ENCOUNTER — Ambulatory Visit
Admission: RE | Admit: 2011-08-11 | Discharge: 2011-08-11 | Disposition: A | Discharge status: Home / Self Care | Payer: Medicare Other | Source: Ambulatory Visit | Attending: Obstetrics and Gynecology | Admitting: Obstetrics and Gynecology

## 2011-08-11 ENCOUNTER — Other Ambulatory Visit: Payer: Self-pay | Admitting: Obstetrics and Gynecology

## 2011-08-11 DIAGNOSIS — R928 Other abnormal and inconclusive findings on diagnostic imaging of breast: Secondary | ICD-10-CM

## 2011-08-11 DIAGNOSIS — N6452 Nipple discharge: Secondary | ICD-10-CM

## 2011-08-11 MED ORDER — GADOBENATE DIMEGLUMINE 529 MG/ML IV SOLN
20.0000 mL | Freq: Once | INTRAVENOUS | Status: AC | PRN
  Administered 2011-08-11: 20 mL via INTRAVENOUS

## 2011-08-12 ENCOUNTER — Ambulatory Visit
Admission: RE | Admit: 2011-08-12 | Discharge: 2011-08-12 | Disposition: A | Discharge status: Home / Self Care | Payer: Medicare Other | Source: Ambulatory Visit | Attending: Obstetrics and Gynecology | Admitting: Obstetrics and Gynecology

## 2011-08-12 ENCOUNTER — Other Ambulatory Visit: Payer: Medicare Other

## 2011-08-12 ENCOUNTER — Other Ambulatory Visit: Payer: Self-pay | Admitting: Obstetrics and Gynecology

## 2011-08-12 DIAGNOSIS — R928 Other abnormal and inconclusive findings on diagnostic imaging of breast: Secondary | ICD-10-CM

## 2011-08-13 ENCOUNTER — Ambulatory Visit
Admission: RE | Admit: 2011-08-13 | Discharge: 2011-08-13 | Disposition: A | Discharge status: Home / Self Care | Payer: Medicare Other | Source: Ambulatory Visit | Attending: Obstetrics and Gynecology | Admitting: Obstetrics and Gynecology

## 2011-08-13 DIAGNOSIS — R928 Other abnormal and inconclusive findings on diagnostic imaging of breast: Secondary | ICD-10-CM

## 2011-08-31 ENCOUNTER — Encounter (INDEPENDENT_AMBULATORY_CARE_PROVIDER_SITE_OTHER): Payer: Self-pay | Admitting: General Surgery

## 2011-08-31 ENCOUNTER — Ambulatory Visit (INDEPENDENT_AMBULATORY_CARE_PROVIDER_SITE_OTHER): Payer: Medicare Other | Admitting: General Surgery

## 2011-08-31 VITALS — BP 110/66 | HR 66 | Temp 98.0°F | Ht 64.0 in | Wt 244.2 lb

## 2011-08-31 DIAGNOSIS — D249 Benign neoplasm of unspecified breast: Secondary | ICD-10-CM

## 2011-08-31 NOTE — Progress Notes (Signed)
Patient ID: Jamie Ayers, female   DOB: 12/17/1945, 66 y.o.   MRN: [id number]  Chief Complaint  Patient presents with  . Pre-op Exam    eval Lt br pap    HPI Jamie Ayers is a 66 y.o. female.  Referred by Dr. Laveda Abbe HPI Is a 66 year old female with multiple medical comorbidities who presents after noticing in December of left breast clear spontaneous nipple discharge. She continues to notice on the inside of overall. This is not been bloody, not expressed, and not bilateral. She underwent evaluation at the breast center with a mammogram as well as ultrasound. Eventually she underwent an MRI that shows a 9 x 9 x 14 mm curvilinear enhancing mass in the upper left breast. There is a dilated duct extending from this mass towards the nipple. There were no other abnormalities. 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 She was then referred for evaluation for possible excisional biopsy. She reports that she does have a family history of breast cancer in her sister in her 40s and her grandmother on her mother side. She also reports that she thinks that her grandmother on her father's side may have had ovarian cancer as well.  Past Medical History  Diagnosis Date  . Menorrhagia     Fibroids  . Fibroid   . Endocervical polyp   . Anemia   . Disorder of vocal cord   . Hernia   . CHF (congestive heart failure)   . GERD (gastroesophageal reflux disease)   . Heart murmur   . Hyperlipidemia   . Thyroid disease     Past Surgical History  Procedure Date  . Vocal cord surg   . Hysteroscopy   . Endometrial biopsy   . Dilation and curettage of uterus   . Nissen fundoplication   . Parathyroid surg   . Hernia repair     Family History  Problem Relation Age of Onset  . Diabetes Father   . Hypertension Father   . Hypertension Mother   . Stroke Mother   . Cancer Maternal Grandmother       Breast cancer  . Ovarian cancer Paternal Grandmother   . Cancer Paternal Grandmother     ovarian  . Breast cancer Sister     Social History History  Substance Use Topics  . Smoking status: Never Smoker   . Smokeless tobacco: Not on file  . Alcohol Use: No    Allergies  Allergen Reactions  . Versed     Cardiac arrest  . Codeine     REACTION: hives/ itch  . Sulfonamide Derivatives     REACTION: hives/itch    Current Outpatient Prescriptions  Medication Sig Dispense Refill  . aspirin 81 MG tablet Take 81 mg by mouth daily.        Marland Kitchen atorvastatin (LIPITOR) 40 MG tablet Take 40 mg by mouth 2 (two) times daily.        . Calcium Carbonate-Vitamin D (CALCIUM + D PO) Take by mouth daily.        . carvedilol (COREG CR) 40 MG 24 hr capsule Take 40 mg by mouth daily.        . digoxin (LANOXIN) 0.125 MG tablet Take 125 mcg by mouth daily.        Marland Kitchen esomeprazole (NEXIUM) 40 MG capsule Take 40 mg by mouth daily.        Marland Kitchen  furosemide (LASIX) 40 MG tablet Take 40 mg by mouth 2 (two) times daily.        . IBUPROFEN PO Take by mouth as needed.        . Levocetirizine Dihydrochloride (XYZAL PO) Take by mouth daily.        . Multiple Vitamin (MULTIVITAMIN) tablet Take 1 tablet by mouth daily.        . Pseudoephedrine-Guaifenesin (MUCINEX D PO) Take by mouth as needed.        Marland Kitchen spironolactone (ALDACTONE) 25 MG tablet Take 25 mg by mouth daily.        . valsartan (DIOVAN) 160 MG tablet Take 160 mg by mouth 2 (two) times daily.        Marland Kitchen zolpidem (AMBIEN) 10 MG tablet Take 10 mg by mouth at bedtime as needed.          Review of Systems Review of Systems  Blood pressure 110/66, pulse 66, temperature 98 F (36.7 C), temperature source Temporal, height 5\' 4"  (1.626 m), weight 244 lb 3.2 oz (110.768 kg), SpO2 95.00%.  Physical Exam Physical Exam  Vitals reviewed. Constitutional: She appears well-developed and well-nourished.  Neck: Neck supple.    Pulmonary/Chest: Right breast exhibits no  inverted nipple, no mass, no nipple discharge, no skin change and no tenderness. Left breast exhibits inverted nipple (she states is longstanding). Left breast exhibits no mass, no nipple discharge (I cannot identify discharge today), no skin change and no tenderness. Breasts are symmetrical.  Lymphadenopathy:    She has no cervical adenopathy.    Data Reviewed BILATERAL BREAST MRI WITH AND WITHOUT CONTRAST  Technique: Multiplanar, multisequence MR images of both breasts  were obtained prior to and following the intravenous administration  of 20ml of Multihance. Three dimensional images were evaluated at  the independent DynaCad workstation.  Comparison: 07/29/2011 and prior mammograms dating back to 220  08/2005. 07/06/2011 left breast ultrasound.  Findings: Moderate to heavy normal background parenchymal  enhancement is identified.  A 9 x 9 x 14 mm irregular curvilinear enhancing mass is identified  within the upper left breast, anterior third, and exhibits rapid  washin/washout kinetics suspicious for neoplasm/DCIS. A dilated  duct extends from this mass to the nipple.  Scattered foci within the anterior left breast are identified.  No other abnormal areas of enhancement are identified within the  left breast.  No abnormal areas enhancement are identified within the right  breast.  No enlarged or abnormal-appearing lymph nodes are noted.  No suspicious abnormalities are identified within the visualized  bones are upper liver.  IMPRESSION:  9 x 9 x 14 mm irregular curvilinear mass in the upper left breast -  suspicious for neoplasm/DCIS. Second look ultrasound and tissue  sampling / biopsy is recommended.  No evidence of multicentric or contralateral malignancy.  No evidence of enlarged or abnormal-appearing lymph nodes.  THREE-DIMENSIONAL MR IMAGE RENDERING ON INDEPENDENT WORKSTATION:  Three-dimensional MR images were rendered by post-processing of the  original MR data on an  independent workstation. The three-  dimensional MR images were interpreted, and findings were reported  in the accompanying complete MRI report for this study.   Assessment    Left breast spontaneous discharge, abnl MRI with papilloma on biopsy    Plan    I discussed with she and her husband today that I think this area should be excised with the wire localization biopsy. There is an associated mass, she does have a family history, and there certainly  is a incidence of cancer associated with a biopsy like this. I discussed the wire localization biopsy with him at length. We also discussed her medical history and  the fact that she has a hysterectomy scheduled at [facility] in a couple of weeks. She is a very significant anesthetic history and I am concerned about. I think it all possible she should have her procedures combined and I would be willing certainly to do this along with the hysterectomy. After we had a long discussion about this ongoing refer her to see a breast surgeon at [facility] to see if it would be possible to combine these at the same time. this certainly could be done with local and sedation but would at least have to have [facility] as backup plan.         Jarielys Girardot 08/31/2011, 10:34 AM

## 2011-09-10 ENCOUNTER — Telehealth (INDEPENDENT_AMBULATORY_CARE_PROVIDER_SITE_OTHER): Payer: Self-pay

## 2011-09-10 NOTE — Telephone Encounter (Signed)
Called pt to notify her of the referral appt made to [facility]. The appt is with Dr Ashok Cordia Howard-McNatt [phone] for 09-21-11 arrive at 8:45 for 9:00. Their office is requesting pt's breast slides and mgm's along with records faxed to [phone]. I advised the pt that all of her records will be sent to [facility]. The pt understands.

## 2011-09-14 ENCOUNTER — Telehealth (INDEPENDENT_AMBULATORY_CARE_PROVIDER_SITE_OTHER): Payer: Self-pay | Admitting: General Surgery

## 2011-09-14 NOTE — Telephone Encounter (Signed)
Jamie Ayers, at [facility], calling for records of pt: Jamie Ayers referred to Dr. Flint Melter.  Please FAX records to her attn at :[phone].

## 2011-12-29 ENCOUNTER — Ambulatory Visit (INDEPENDENT_AMBULATORY_CARE_PROVIDER_SITE_OTHER): Payer: Medicare Other | Admitting: Obstetrics and Gynecology

## 2011-12-29 ENCOUNTER — Encounter: Payer: Self-pay | Admitting: Obstetrics and Gynecology

## 2011-12-29 DIAGNOSIS — R300 Dysuria: Secondary | ICD-10-CM

## 2011-12-29 DIAGNOSIS — L293 Anogenital pruritus, unspecified: Secondary | ICD-10-CM

## 2011-12-29 DIAGNOSIS — N898 Other specified noninflammatory disorders of vagina: Secondary | ICD-10-CM

## 2011-12-29 LAB — URINALYSIS W MICROSCOPIC + REFLEX CULTURE
Bilirubin Urine: NEGATIVE
Glucose, UA: NEGATIVE mg/dL
Hgb urine dipstick: NEGATIVE
Protein, ur: NEGATIVE mg/dL
Urobilinogen, UA: 0.2 mg/dL (ref 0.0–1.0)

## 2011-12-29 LAB — WET PREP FOR TRICH, YEAST, CLUE
Clue Cells Wet Prep HPF POC: NONE SEEN
Trich, Wet Prep: NONE SEEN
Yeast Wet Prep HPF POC: NONE SEEN

## 2011-12-29 MED ORDER — FLUCONAZOLE 200 MG PO TABS: 200.0000 mg | ORAL_TABLET | Freq: Every day | ORAL | Status: AC

## 2011-12-29 NOTE — Progress Notes (Signed)
Patient came in today complaining of sleep disturbance with night sweats since she had her hysterectomy at [facility]. She obviously was already menopausal at 65. She does however date the symptoms from her recent hysterectomy. She now needs evaluation by a urologist because of a mass found above her kidney. She also had a breast biopsy on the day of her hysterectomy which was benign. She is also complaining of vaginal discharge and itching and she has a long history of yeast infections. Her urinalysis today was abnormal. She is also complaining of vaginal dryness with intercourse.  Exam: Kennon Portela present. External: Within normal limits. BUS: Within normal limits. Vaginal exam: Typical yeast discharge with negative wet prep.  Assessment: #1. Menopausal symptoms #2. Atrophic vaginitis #3. Yeast vaginitis #4. Possible urinary tract infection  Plan: We discussed estrogen patch. I explained that now that she's had a hysterectomy and would not need progesterone there is no high risk of breast cancer. For the moment she declined. We also discussed SSRI treatment for menopausal symptoms. For the moment she declined. We also discussed just using Ambien which she gets from her sleep Dr. Who treats her for sleep apnea. For the moment she will do this. She will use Diflucan 200 mg daily for 3 days for yeast. Her urine was cultured. She will call Selena Batten on Monday for results.

## 2012-01-03 ENCOUNTER — Telehealth: Payer: Self-pay | Admitting: *Deleted

## 2012-01-03 MED ORDER — NITROFURANTOIN MONOHYD MACRO 100 MG PO CAPS: 100.0000 mg | ORAL_CAPSULE | Freq: Two times a day (BID) | ORAL | Status: AC

## 2012-01-03 NOTE — Telephone Encounter (Signed)
Please inform patient that her urine culture was positive for Escherichia coli. Please: Macrobid one by mouth twice a day for 7 days.

## 2012-01-03 NOTE — Telephone Encounter (Signed)
(  Dr.G patient) pt calling to follow up with urine culture results from office visit 12/29/11. Final results will be in your inbox. Please advise

## 2012-01-03 NOTE — Telephone Encounter (Signed)
Pt informed with the below note, rx sent to pharmacy. 

## 2012-01-04 ENCOUNTER — Telehealth: Payer: Self-pay | Admitting: *Deleted

## 2012-01-04 NOTE — Telephone Encounter (Signed)
Hold off on the magnesium while she's taking the Macrobid.

## 2012-01-04 NOTE — Telephone Encounter (Signed)
Pt informed with the below note. 

## 2012-01-04 NOTE — Telephone Encounter (Signed)
(  follow up from telephone encounter 01/03/12) Pt was given Macrobid x 7 days yesterday, pt would like to know if okay to continue to take her calcium, magnesium, and nexium while taking medication, pt said label said to stop taking while on Macrobid? Please advise

## 2012-01-10 ENCOUNTER — Other Ambulatory Visit: Payer: Self-pay | Admitting: Urology

## 2012-01-10 DIAGNOSIS — N289 Disorder of kidney and ureter, unspecified: Secondary | ICD-10-CM

## 2012-02-18 ENCOUNTER — Emergency Department (HOSPITAL_COMMUNITY)
Admission: EM | Admit: 2012-02-18 | Discharge: 2012-02-18 | Disposition: A | Discharge status: Home / Self Care | Payer: Medicare Other | Attending: Emergency Medicine | Admitting: Emergency Medicine

## 2012-02-18 ENCOUNTER — Emergency Department (HOSPITAL_COMMUNITY): Payer: Medicare Other

## 2012-02-18 ENCOUNTER — Encounter (HOSPITAL_COMMUNITY): Payer: Self-pay

## 2012-02-18 ENCOUNTER — Encounter (HOSPITAL_COMMUNITY): Payer: Self-pay | Admitting: Emergency Medicine

## 2012-02-18 ENCOUNTER — Emergency Department (INDEPENDENT_AMBULATORY_CARE_PROVIDER_SITE_OTHER)
Admission: EM | Admit: 2012-02-18 | Discharge: 2012-02-18 | Disposition: A | Discharge status: Nursing Home (Includes ICF) | Payer: Medicare Other | Source: Home / Self Care | Attending: Emergency Medicine | Admitting: Emergency Medicine

## 2012-02-18 DIAGNOSIS — K219 Gastro-esophageal reflux disease without esophagitis: Secondary | ICD-10-CM | POA: Insufficient documentation

## 2012-02-18 DIAGNOSIS — R079 Chest pain, unspecified: Secondary | ICD-10-CM

## 2012-02-18 DIAGNOSIS — E785 Hyperlipidemia, unspecified: Secondary | ICD-10-CM | POA: Insufficient documentation

## 2012-02-18 DIAGNOSIS — R05 Cough: Secondary | ICD-10-CM | POA: Insufficient documentation

## 2012-02-18 DIAGNOSIS — R059 Cough, unspecified: Secondary | ICD-10-CM | POA: Insufficient documentation

## 2012-02-18 DIAGNOSIS — R0789 Other chest pain: Secondary | ICD-10-CM

## 2012-02-18 DIAGNOSIS — I509 Heart failure, unspecified: Secondary | ICD-10-CM | POA: Insufficient documentation

## 2012-02-18 DIAGNOSIS — D649 Anemia, unspecified: Secondary | ICD-10-CM | POA: Insufficient documentation

## 2012-02-18 DIAGNOSIS — E079 Disorder of thyroid, unspecified: Secondary | ICD-10-CM | POA: Insufficient documentation

## 2012-02-18 LAB — CBC WITH DIFFERENTIAL/PLATELET
Basophils Absolute: 0 10*3/uL (ref 0.0–0.1)
Eosinophils Absolute: 0 10*3/uL (ref 0.0–0.7)
HCT: 35.2 % — ABNORMAL LOW (ref 36.0–46.0)
Lymphocytes Relative: 32 % (ref 12–46)
MCHC: 32.1 g/dL (ref 30.0–36.0)
Monocytes Relative: 9 % (ref 3–12)
Neutro Abs: 3.9 10*3/uL (ref 1.7–7.7)
Neutrophils Relative %: 59 % (ref 43–77)
Platelets: 166 10*3/uL (ref 150–400)
RDW: 14.8 % (ref 11.5–15.5)
WBC: 6.6 10*3/uL (ref 4.0–10.5)

## 2012-02-18 LAB — BASIC METABOLIC PANEL
BUN: 26 mg/dL — ABNORMAL HIGH (ref 6–23)
CO2: 29 mEq/L (ref 19–32)
Calcium: 10.4 mg/dL (ref 8.4–10.5)
Chloride: 100 mEq/L (ref 96–112)
Creatinine, Ser: 1.01 mg/dL (ref 0.50–1.10)

## 2012-02-18 MED ORDER — ASPIRIN 81 MG PO CHEW
324.0000 mg | CHEWABLE_TABLET | Freq: Once | ORAL | Status: AC
  Administered 2012-02-18: 324 mg via ORAL

## 2012-02-18 MED ORDER — NITROGLYCERIN 0.4 MG SL SUBL: 0.4000 mg | SUBLINGUAL_TABLET | SUBLINGUAL | Status: DC | PRN

## 2012-02-18 MED ORDER — ASPIRIN 81 MG PO CHEW
CHEWABLE_TABLET | ORAL | Status: AC
  Filled 2012-02-18: qty 4

## 2012-02-18 MED ORDER — ALBUTEROL SULFATE HFA 108 (90 BASE) MCG/ACT IN AERS: 2.0000 | INHALATION_SPRAY | RESPIRATORY_TRACT | Status: AC | PRN

## 2012-02-18 MED ORDER — SODIUM CHLORIDE 0.9 % IV SOLN
INTRAVENOUS | Status: DC
  Administered 2012-02-18 (×2): via INTRAVENOUS

## 2012-02-18 MED ORDER — NITROGLYCERIN 0.4 MG SL SUBL
SUBLINGUAL_TABLET | SUBLINGUAL | Status: AC
  Filled 2012-02-18: qty 25

## 2012-02-18 NOTE — ED Notes (Signed)
Per [facility], pt reports "uneasy" chest pain. Given 324 ASA. Pain began to subside. Hx CHF, HTN

## 2012-02-18 NOTE — ED Notes (Signed)
Notified [facility] 

## 2012-02-18 NOTE — ED Notes (Signed)
Pt reports "discomfort" in her chest. Denies chest pain. States "it's not pain, pressure, or heaviness. It feels like maybe congestion. I take sudafed in the morning and mucinex at night. That usually controls it". Respirations even and regular. Denies SOB. Denies pain in shoulders and back. Denies abdominal pain. NAD. Husband at bedside.

## 2012-02-18 NOTE — ED Provider Notes (Signed)
No chief complaint on file.   History of Present Illness:   Jamie Ayers is a 66 year old female who has a history of substernal chest discomfort beginning this morning when she woke up. She states that she's had this off and on for about the past 2 weeks but it is worse today. She's not describing pain, but states it is more of an uneasy feeling, a flutter, or an anxious feeling in her chest. The sensation does not radiate. The patient does have some nausea, shortness of breath, and diaphoresis, although she states that all these symptoms occur continuously and, all the time for her anyway. They're not any worse today. She has a history of congestive heart failure which is being followed by Dr. Nicki Guadalajara. She called Dr. Landry Dyke office today, but he was indisposed, so than she called her primary care doctor who told her to come here. She has a history of high blood pressure and hyperlipidemia. She denies any smoking or drinking. She's had no fever, chills, coughing, wheezing, dizziness, syncope, or ankle edema.  Review of Systems:  Other than noted above, the patient denies any of the following symptoms. Systemic:  No fever, chills, sweats, or fatigue. ENT:  No nasal congestion, rhinorrhea, or sore throat. Pulmonary:  No cough, wheezing, shortness of breath, sputum production, hemoptysis. Cardiac:  No palpitations, rapid heartbeat, dizziness, presyncope or syncope. GI:  No abdominal pain, heartburn, nausea, or vomiting. Skin:  No rash or itching. Ext:  No leg pain or swelling.   PMFSH:  Past medical history, family history, social history, meds, and allergies were reviewed and updated as needed.  Physical Exam:   Vital signs:  BP 129/84  Pulse 62  Temp 98.1 F (36.7 C) (Oral)  Resp 22  SpO2 96% Gen:  Alert, oriented, in no distress, skin warm and dry. Eye:  PERRL, lids and conjunctivas normal.  Sclera non-icteric. ENT:  Mucous membranes moist, pharynx clear. Neck:  Supple, no  adenopathy or tenderness.  No JVD. Lungs:  Clear to auscultation, no wheezes, rales or rhonchi.  No respiratory distress. Heart:  Regular rhythm.  No gallops, murmers, clicks or rubs. Chest:  No chest wall tenderness. Abdomen:  Soft, nontender, no organomegaly or mass.  Bowel sounds normal.  No pulsatile abdominal mass or bruit. Ext:  No edema.  No calf tenderness and Homann's sign negative.  Pulses full and equal. Skin:  Warm and dry.  No rash.   EKG:   Date: 02/18/2012  Rate: 60  Rhythm: normal sinus rhythm  QRS Axis: normal  Intervals: PR prolonged  ST/T Wave abnormalities: normal  Conduction Disutrbances:first-degree A-V block   Narrative Interpretation: Normal sinus rhythm with first degree AV block, otherwise normal.  Old EKG Reviewed: none available   Medications given in [facility]:  She was given aspirin 325 mg by mouth, nitroglycerin 0.4 mg sublingually and IV normal saline was started. She will be sent to [facility] by [facility].  Assessment:  The encounter diagnosis was Chest discomfort.  Plan:   1.  The following meds were prescribed:   New Prescriptions   No medications on file   2.  The patient was sent to the emergency department via [facility].  Reuben Likes, MD 02/18/12 [phone]

## 2012-02-18 NOTE — ED Notes (Signed)
Pt. Reports congestion and "uneasy" feeling in chest, states "it is not chest pain". Hx of CHF.

## 2012-02-18 NOTE — ED Notes (Signed)
C/o chest pain or "uneasy" feeling in chest also referred to as "anxious" feeling chest , intermittent episodes for 2 weeks.  Reports always feeling sob, but no new sob.  No nausea, no diaphoresis. Patient does not appear sob.

## 2012-02-18 NOTE — ED Provider Notes (Signed)
History     CSN: [id number]  Arrival date & time 02/18/12  1736   First MD Initiated Contact with Patient 02/18/12 1814      Chief Complaint  Patient presents with  . Chest Pain    Patient is a 66 year old female with past medical history as listed below and relevant for CHF and HLD who presents to the ED with complaints of "uneasy feeling in chest."  Patient denies any pain.  Location is SS and non radiating.  She states feeling has been present for 2 weeks but worsened this morning.  Associated symptoms feeling anxious due to family issues, but she denies DOE, swelling, palpations, N/V, or diaphoresis.  When asked about cough she states she has had two weeks of productive cough.  She states seeing PCP in regards to this and was placed on antibiotics.  Prior to arrival in the ED, patient seen at [facility] where she was given ASA, NTG, and EKG showed 1st degree AV block but otherwise unremarkable.      (Consider location/radiation/quality/duration/timing/severity/associated sxs/prior treatment) Patient is a 66 y.o. female presenting with general illness. The history is provided by the patient and medical records. No language interpreter was used.  Illness  The current episode started more than 1 week ago. The onset was gradual. The problem occurs continuously. The problem has been gradually worsening. The problem is mild. Nothing relieves the symptoms. Nothing aggravates the symptoms. Associated symptoms include cough. She has been behaving normally. She has been eating and drinking normally. Urine output has been normal. The last void occurred less than 6 hours ago. There were no sick contacts. Recently, medical care has been given at another facility. Services received include tests performed.    Past Medical History  Diagnosis Date  . Menorrhagia     Fibroids  . Fibroid   . Endocervical polyp   . Anemia   . Disorder of vocal cord   . Hernia   . CHF (congestive heart failure)   . GERD  (gastroesophageal reflux disease)   . Heart murmur   . Hyperlipidemia   . Thyroid disease     Past Surgical History  Procedure Date  . Vocal cord surg   . Hysteroscopy   . Endometrial biopsy   . Dilation and curettage of uterus   . Nissen fundoplication   . Parathyroid surg   . Hernia repair   . Abdominal hysterectomy 2013    TAH BSO  . Oophorectomy     BSO  . Breast surgery     Papilloma    Family History  Problem Relation Age of Onset  . Diabetes Father   . Hypertension Father   . Hypertension Mother   . Stroke Mother   . Cancer Maternal Grandmother     Breast cancer  . Ovarian cancer Paternal Grandmother   . Cancer Paternal Grandmother     ovarian  . Breast cancer Sister     History  Substance Use Topics  . Smoking status: Never Smoker   . Smokeless tobacco: Not on file  . Alcohol Use: No    OB History    Grav Para Term Preterm Abortions TAB SAB Ect Mult Living   4 4 4       4       Review of Systems  Respiratory: Positive for cough.   All other systems reviewed and are negative.    Allergies  Midazolam hcl; Codeine; and Sulfonamide derivatives  Home Medications   Current Outpatient  Rx  Name Route Sig Dispense Refill  . ASPIRIN 81 MG PO CHEW Oral Chew 324 mg by mouth once.    . ASPIRIN 81 MG PO TABS Oral Take 162 mg by mouth at bedtime.     . ATORVASTATIN CALCIUM 40 MG PO TABS Oral Take 40 mg by mouth 2 (two) times daily.      Marland Kitchen CALCIUM + D PO Oral Take 1 tablet by mouth daily.     Marland Kitchen CARVEDILOL PHOSPHATE ER 40 MG PO CP24 Oral Take 40 mg by mouth every evening.     Arloa Koh DM MAXIMUM STRENGTH 60-1200 MG PO TB12 Oral Take 1 tablet by mouth daily.    Marland Kitchen DIGOXIN 0.125 MG PO TABS Oral Take 125 mcg by mouth daily.      Marland Kitchen ESOMEPRAZOLE MAGNESIUM 40 MG PO CPDR Oral Take 40 mg by mouth daily.      . FUROSEMIDE 40 MG PO TABS Oral Take 40 mg by mouth 2 (two) times daily.      Marland Kitchen LEVOCETIRIZINE DIHYDROCHLORIDE 5 MG PO TABS Oral Take 5 mg by mouth every  evening.    Marland Kitchen MONTELUKAST SODIUM 10 MG PO TABS Oral Take 10 mg by mouth at bedtime.    Marland Kitchen ONE-DAILY MULTI VITAMINS PO TABS Oral Take 1 tablet by mouth daily.      Marland Kitchen SPIRONOLACTONE 25 MG PO TABS Oral Take 12.5 mg by mouth 2 (two) times daily.     Marland Kitchen VALSARTAN 160 MG PO TABS Oral Take 160 mg by mouth 2 (two) times daily.      Marland Kitchen ZOLPIDEM TARTRATE 10 MG PO TABS Oral Take 10 mg by mouth at bedtime as needed. For sleep      There were no vitals taken for this visit.  Physical Exam  Nursing note and vitals reviewed. Constitutional: She is oriented to person, place, and time. She appears well-developed and well-nourished.  HENT:  Head: Normocephalic and atraumatic.  Right Ear: External ear normal.  Left Ear: External ear normal.  Mouth/Throat: Oropharynx is clear and moist.  Eyes: Conjunctivae and EOM are normal. Pupils are equal, round, and reactive to light.  Neck: Normal range of motion. Neck supple.  Cardiovascular: Normal rate, regular rhythm, normal heart sounds and intact distal pulses.  Exam reveals no gallop and no friction rub.   No murmur heard. Pulmonary/Chest: Effort normal and breath sounds normal.  Abdominal: Soft. Bowel sounds are normal.  Musculoskeletal: Normal range of motion. She exhibits no edema and no tenderness.  Neurological: She is alert and oriented to person, place, and time. She has normal reflexes.  Skin: Skin is warm and dry.  Psychiatric: She has a normal mood and affect.    ED Course  Procedures (including critical care time)  Labs Reviewed  BASIC METABOLIC PANEL - Abnormal; Notable for the following:    Glucose, Bld 117 (*)     BUN 26 (*)     GFR calc non Af Amer 57 (*)     GFR calc Af Amer 66 (*)     All other components within normal limits  CBC WITH DIFFERENTIAL - Abnormal; Notable for the following:    Hemoglobin 11.3 (*)     HCT 35.2 (*)     MCV 72.6 (*)     MCH 23.3 (*)     All other components within normal limits  TROPONIN I    Results  for orders placed during the hospital encounter of 02/18/12  BASIC METABOLIC PANEL  Component Value Range   Sodium 140  135 - 145 mEq/L   Potassium 3.9  3.5 - 5.1 mEq/L   Chloride 100  96 - 112 mEq/L   CO2 29  19 - 32 mEq/L   Glucose, Bld 117 (*) 70 - 99 mg/dL   BUN 26 (*) 6 - 23 mg/dL   Creatinine, Ser 9.52  0.50 - 1.10 mg/dL   Calcium 84.1  8.4 - 32.4 mg/dL   GFR calc non Af Amer 57 (*) >90 mL/min   GFR calc Af Amer 66 (*) >90 mL/min  CBC WITH DIFFERENTIAL      Component Value Range   WBC 6.6  4.0 - 10.5 K/uL   RBC 4.85  3.87 - 5.11 MIL/uL   Hemoglobin 11.3 (*) 12.0 - 15.0 g/dL   HCT 40.1 (*) 02.7 - 25.3 %   MCV 72.6 (*) 78.0 - 100.0 fL   MCH 23.3 (*) 26.0 - 34.0 pg   MCHC 32.1  30.0 - 36.0 g/dL   RDW 66.4  40.3 - 47.4 %   Platelets 166  150 - 400 K/uL   Neutrophils Relative 59  43 - 77 %   Lymphocytes Relative 32  12 - 46 %   Monocytes Relative 9  3 - 12 %   Eosinophils Relative 0  0 - 5 %   Basophils Relative 0  0 - 1 %   Neutro Abs 3.9  1.7 - 7.7 K/uL   Lymphs Abs 2.1  0.7 - 4.0 K/uL   Monocytes Absolute 0.6  0.1 - 1.0 K/uL   Eosinophils Absolute 0.0  0.0 - 0.7 K/uL   Basophils Absolute 0.0  0.0 - 0.1 K/uL   RBC Morphology TARGET CELLS     WBC Morphology ATYPICAL LYMPHOCYTES     Smear Review LARGE PLATELETS PRESENT    TROPONIN I      Component Value Range   Troponin I <0.30  <0.30 ng/mL    Dg Chest 2 View  02/18/2012  *RADIOLOGY REPORT*  Clinical Data: Chest pain and congestion.  CHEST - 2 VIEW  Comparison: None.  Findings: Cardiac silhouette mildly enlarged.  Thoracic aorta tortuous and mildly atherosclerotic.  Hilar and mediastinal contours otherwise unremarkable.  Pulmonary venous hypertension without overt edema.  Linear atelectasis or scar in the right lower lobe.  Lungs otherwise clear.  No pleural effusions.  Degenerative changes involving the thoracic spine.  IMPRESSION: Mild cardiomegaly.  Pulmonary venous hypertension without overt edema.  Linear  atelectasis or scar in the right lower lobe.  No acute cardiopulmonary disease otherwise.   Original Report Authenticated By: Arnell Sieving, M.D.       Date: 02/18/2012  Rate: 60  Rhythm: normal sinus rhythm  QRS Axis: normal  Intervals: PR prolonged  ST/T Wave abnormalities: normal  Conduction Disutrbances:first-degree A-V block   Narrative Interpretation:   Old EKG Reviewed: none available    1. Chest pain   2. Cough       MDM    Patient is a 66 year old female with past medical history significant for CHF and hyperlipidemia who presents to the emergency department with complaints of "uneasiness feeling" in chest and one-week history of productive cough.  Patient's the patient specifically denies any chest pain or shortness of breath. Upon arrival in the emergency department patient's vital signs are all within normal limits. Physical exam as above and unremarkable. Specifically no signs of volume overload. Given patient's past medical history, being female, and complaints of abnormal  sensation in chest it was felt that ACS need to be ruled out with EKG and troponin. Results as above and unremarkable. A chest x-ray and basic labs were also ordered and showed no evidence of acute abnormality. Clinical presentation most consistent with possible bronchitis versus anxiety. Patient was given a prescription for albuterol in hopes that that will help with her bronchitis and cough. She is to follow up with her regular doctor and cardiologist it is possible for further evaluation.       Johnney Ou, MD 02/19/12 [phone]

## 2012-02-26 NOTE — ED Provider Notes (Signed)
I saw and evaluated the patient, reviewed the resident's note and I agree with the findings and plan.   .Face to face Exam:  General:  Awake HEENT:  Atraumatic Resp:  Normal effort Abd:  Nondistended Neuro:No focal weakness Lymph: No adenopathy   Nelia Shi, MD 02/26/12 2243

## 2012-03-14 DIAGNOSIS — E785 Hyperlipidemia, unspecified: Secondary | ICD-10-CM

## 2012-03-14 HISTORY — DX: Hyperlipidemia, unspecified: E78.5

## 2012-03-23 ENCOUNTER — Encounter: Payer: Self-pay | Admitting: Obstetrics and Gynecology

## 2012-03-23 ENCOUNTER — Other Ambulatory Visit: Payer: Self-pay | Admitting: Gynecology

## 2012-03-23 ENCOUNTER — Other Ambulatory Visit: Payer: Self-pay | Admitting: Obstetrics and Gynecology

## 2012-03-23 ENCOUNTER — Ambulatory Visit (INDEPENDENT_AMBULATORY_CARE_PROVIDER_SITE_OTHER): Payer: Medicare Other | Admitting: Obstetrics and Gynecology

## 2012-03-23 VITALS — BP 112/70 | Ht 64.0 in | Wt 235.0 lb

## 2012-03-23 DIAGNOSIS — R82998 Other abnormal findings in urine: Secondary | ICD-10-CM

## 2012-03-23 DIAGNOSIS — Z780 Asymptomatic menopausal state: Secondary | ICD-10-CM

## 2012-03-23 DIAGNOSIS — B373 Candidiasis of vulva and vagina: Secondary | ICD-10-CM

## 2012-03-23 DIAGNOSIS — N898 Other specified noninflammatory disorders of vagina: Secondary | ICD-10-CM

## 2012-03-23 DIAGNOSIS — R8290 Unspecified abnormal findings in urine: Secondary | ICD-10-CM

## 2012-03-23 DIAGNOSIS — L293 Anogenital pruritus, unspecified: Secondary | ICD-10-CM

## 2012-03-23 DIAGNOSIS — N952 Postmenopausal atrophic vaginitis: Secondary | ICD-10-CM

## 2012-03-23 DIAGNOSIS — Z853 Personal history of malignant neoplasm of breast: Secondary | ICD-10-CM

## 2012-03-23 LAB — URINALYSIS W MICROSCOPIC + REFLEX CULTURE
Bilirubin Urine: NEGATIVE
Nitrite: NEGATIVE
RBC / HPF: NONE SEEN RBC/hpf (ref <3)
Specific Gravity, Urine: 1.01 (ref 1.005–1.030)
Urobilinogen, UA: 0.2 mg/dL (ref 0.0–1.0)
pH: 6 (ref 5.0–8.0)

## 2012-03-23 LAB — WET PREP FOR TRICH, YEAST, CLUE: Clue Cells Wet Prep HPF POC: NONE SEEN

## 2012-03-23 NOTE — Progress Notes (Signed)
Patient came to see me today for further followup. Earlier this year she had a TAH, BSO at  [facility] for fibroids. 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 In addition I had sent her to Dr. Mia Creek  with hopes that he could do her robotically  which he could not. Her pathology was benign. She had a Pap smear this year. In addition at the time of the same surgery she had a papilloma removed from her left nipple. It was also benign. She thinks she is due for followup mammogram in February, 2014 but she will check. She has not had cervical dysplasia. She had a normal bone density at our office in 2009. She is complaining today of vaginal itching which she said is recurrent. She is frequently on antibiotics. She also has an odor to  her urine and had an abnormal urinalysis here. 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 She is having hot flashes and vaginal dryness but so far has been reluctant to take estrogen. She is having no vaginal bleeding. She is having no pelvic pain. She is sexually active.  ROS: 12 system review done. Pertinent positives above. Other positives include constipation, colon polyps, vocal cord polyps, and a benign intraductal papilloma of her breasts discussed above.  HEENT: Within normal limits. Kennon Portela present. Neck: No masses. Supraclavicular lymph nodes: Not enlarged. Breasts: Examined in both sitting and lying position. Symmetrical without skin changes or masses. Abdomen: Soft no masses guarding or rebound. No hernias. Pelvic: External within normal limits. BUS within normal limits. Vaginal examination shows good estrogen effect, no cystocele enterocele or rectocele. Cervix and uterus absent. Adnexa within normal limits. Rectovaginal confirmatory. Extremities within normal limits.  Assessment: #1. Menopausal symptoms #2. Atrophic vaginitis #3. Recurrent yeast  vaginitis #4. Possible urinary tract infection  Plan: Continue yearly mammograms. Discussed HRT and no increased risk of breast cancer without progesterone. She will inform when necessary. I would use a patch. Wet prep showed bacteria and white blood cells. We started her on boric acid suppositories nightly in the vagina for 2 weeks and 3 times a week. We cultured her urine.The new Pap smear guidelines were discussed with the patient. Pap already done this year.

## 2012-03-23 NOTE — Patient Instructions (Signed)
We will let you  know about urine culture.

## 2012-03-26 LAB — URINE CULTURE: Colony Count: 80000

## 2012-03-29 ENCOUNTER — Other Ambulatory Visit: Payer: Self-pay | Admitting: Obstetrics and Gynecology

## 2012-03-30 ENCOUNTER — Encounter: Payer: Self-pay | Admitting: Obstetrics and Gynecology

## 2012-04-03 ENCOUNTER — Other Ambulatory Visit: Payer: Self-pay | Admitting: Obstetrics and Gynecology

## 2012-04-03 DIAGNOSIS — N390 Urinary tract infection, site not specified: Secondary | ICD-10-CM

## 2012-04-10 ENCOUNTER — Other Ambulatory Visit: Payer: Medicare Other

## 2012-04-10 ENCOUNTER — Other Ambulatory Visit: Payer: Self-pay | Admitting: *Deleted

## 2012-04-10 DIAGNOSIS — N390 Urinary tract infection, site not specified: Secondary | ICD-10-CM

## 2012-04-10 MED ORDER — IBUPROFEN 800 MG PO TABS: 800.0000 mg | ORAL_TABLET | Freq: Three times a day (TID) | ORAL | Status: DC | PRN

## 2012-04-20 ENCOUNTER — Encounter: Payer: Self-pay | Admitting: Internal Medicine

## 2012-07-05 ENCOUNTER — Ambulatory Visit
Admission: RE | Admit: 2012-07-05 | Discharge: 2012-07-05 | Disposition: A | Discharge status: Home / Self Care | Payer: Medicare Other | Source: Ambulatory Visit | Attending: Urology | Admitting: Urology

## 2012-07-05 DIAGNOSIS — N289 Disorder of kidney and ureter, unspecified: Secondary | ICD-10-CM

## 2012-07-10 ENCOUNTER — Encounter (HOSPITAL_COMMUNITY): Payer: Self-pay

## 2012-07-10 DIAGNOSIS — J45909 Unspecified asthma, uncomplicated: Secondary | ICD-10-CM | POA: Insufficient documentation

## 2012-07-10 DIAGNOSIS — J069 Acute upper respiratory infection, unspecified: Secondary | ICD-10-CM | POA: Insufficient documentation

## 2012-07-10 DIAGNOSIS — K219 Gastro-esophageal reflux disease without esophagitis: Secondary | ICD-10-CM | POA: Insufficient documentation

## 2012-07-10 DIAGNOSIS — G4730 Sleep apnea, unspecified: Secondary | ICD-10-CM

## 2012-07-10 DIAGNOSIS — I509 Heart failure, unspecified: Secondary | ICD-10-CM | POA: Insufficient documentation

## 2012-07-10 DIAGNOSIS — I10 Essential (primary) hypertension: Secondary | ICD-10-CM | POA: Insufficient documentation

## 2012-07-10 DIAGNOSIS — E785 Hyperlipidemia, unspecified: Secondary | ICD-10-CM | POA: Insufficient documentation

## 2012-07-10 DIAGNOSIS — J309 Allergic rhinitis, unspecified: Secondary | ICD-10-CM | POA: Insufficient documentation

## 2012-07-10 DIAGNOSIS — D141 Benign neoplasm of larynx: Secondary | ICD-10-CM | POA: Insufficient documentation

## 2012-07-24 ENCOUNTER — Other Ambulatory Visit: Payer: Self-pay | Admitting: Gynecology

## 2012-07-24 DIAGNOSIS — Z1231 Encounter for screening mammogram for malignant neoplasm of breast: Secondary | ICD-10-CM

## 2012-08-23 ENCOUNTER — Ambulatory Visit
Admission: RE | Admit: 2012-08-23 | Discharge: 2012-08-23 | Disposition: A | Discharge status: Home / Self Care | Payer: Medicare Other | Source: Ambulatory Visit | Attending: Gynecology | Admitting: Gynecology

## 2012-08-23 DIAGNOSIS — Z1231 Encounter for screening mammogram for malignant neoplasm of breast: Secondary | ICD-10-CM

## 2012-08-31 ENCOUNTER — Telehealth: Payer: Self-pay | Admitting: *Deleted

## 2012-08-31 NOTE — Telephone Encounter (Signed)
(  Dr.G patient) pt will be seeing you for continuing care she had mammogram done on 08/23/12 at breast center. She said that normally she would get a diag. Mammogram due to her very dense breast and aching breast. But this time only screening done. She asked if diag. Should be done since this has been done in past? Please advise

## 2012-08-31 NOTE — Telephone Encounter (Signed)
That is a difficult question as I have never seen or examined her.  They did not note any difficulty with visualization on the mammogram report and she had an MRI of her breast one year ago. I tend to rely on radiology to make recommendations if they feel a different study is needed to to lack of visualization. I would recommend that she have 3-D mammography when she does have them done as this will improve interpretation.

## 2012-09-01 NOTE — Telephone Encounter (Signed)
Pt informed with the below note. 

## 2012-10-19 ENCOUNTER — Ambulatory Visit: Payer: Self-pay | Admitting: Gynecology

## 2012-10-24 ENCOUNTER — Encounter: Payer: Self-pay | Admitting: Gynecology

## 2012-10-24 ENCOUNTER — Telehealth: Payer: Self-pay | Admitting: *Deleted

## 2012-10-24 ENCOUNTER — Ambulatory Visit (INDEPENDENT_AMBULATORY_CARE_PROVIDER_SITE_OTHER): Payer: Medicare Other | Admitting: Gynecology

## 2012-10-24 DIAGNOSIS — A499 Bacterial infection, unspecified: Secondary | ICD-10-CM

## 2012-10-24 DIAGNOSIS — R102 Pelvic and perineal pain: Secondary | ICD-10-CM

## 2012-10-24 DIAGNOSIS — N644 Mastodynia: Secondary | ICD-10-CM

## 2012-10-24 DIAGNOSIS — N898 Other specified noninflammatory disorders of vagina: Secondary | ICD-10-CM

## 2012-10-24 DIAGNOSIS — N949 Unspecified condition associated with female genital organs and menstrual cycle: Secondary | ICD-10-CM

## 2012-10-24 DIAGNOSIS — B9689 Other specified bacterial agents as the cause of diseases classified elsewhere: Secondary | ICD-10-CM

## 2012-10-24 DIAGNOSIS — N760 Acute vaginitis: Secondary | ICD-10-CM

## 2012-10-24 DIAGNOSIS — N390 Urinary tract infection, site not specified: Secondary | ICD-10-CM

## 2012-10-24 LAB — URINALYSIS W MICROSCOPIC + REFLEX CULTURE
Casts: NONE SEEN
Crystals: NONE SEEN
Glucose, UA: NEGATIVE mg/dL
Nitrite: NEGATIVE
Specific Gravity, Urine: 1.015 (ref 1.005–1.030)
pH: 6 (ref 5.0–8.0)

## 2012-10-24 LAB — WET PREP FOR TRICH, YEAST, CLUE: Yeast Wet Prep HPF POC: NONE SEEN

## 2012-10-24 MED ORDER — CLINDAMYCIN PHOSPHATE 2 % VA CREA: 1.0000 | TOPICAL_CREAM | Freq: Every day | VAGINAL | Status: DC

## 2012-10-24 MED ORDER — CIPROFLOXACIN HCL 250 MG PO TABS: 250.0000 mg | ORAL_TABLET | Freq: Two times a day (BID) | ORAL | Status: DC

## 2012-10-24 NOTE — Telephone Encounter (Signed)
Order placed for the below, they will contact patient.

## 2012-10-24 NOTE — Telephone Encounter (Signed)
Message copied by Aura Camps on Tue Oct 24, 2012  3:05 PM ------      Message from: Dara Lords      Created: Tue Oct 24, 2012  1:13 PM       Patient is adamant about having a followup diagnostic mammogram at the breast center. Had a recent screening mammogram but is complaining of bilateral breast pain. Also had a papilloma excised last year from her left breast. Recommend scheduling diagnostic bilateral mammograms and let the breast Center decide how they want to study her. ------

## 2012-10-24 NOTE — Progress Notes (Signed)
Patient presents with numerous complaints. 1. Bilateral breast tenderness of the last 2 months. Comes and goes. No palpable abnormalities. Had apparent screening mammogram in March 2014 which was normal. 2. Vaginal discharge with odor. Occasional itching. Has been coming and going over the last several months. 3. Lower abdominal discomfort diffuse. Is having constipation issues. Is on probiotic and daily Metamucil. Reports colonoscopy January 2014. No nausea vomiting fever chills.   Exam with Selena Batten assistant HEENT normal. Lungs clear Cardiac regular rate without rubs murmurs gallops Bilateral breasts examined lying and sitting without masses retractions discharge adenopathy. Inverted nipples bilaterally. Left axillary scar noted. Abdomen obese soft with active bowel sounds. Nontender without masses guarding rebound organomegaly. Pelvic external BUS vagina with atrophic changes. White discharge noted. Bimanual without gross masses or tenderness.  Assessment and plan: 1. Bilateral breast tenderness. Exam is normal. Recent mammogram. Patient had papilloma excised from the left breast last year and said that they wanted to a diagnostic followup but there was no mention on her recent report from the screening study. She's adamant about ordering a diagnostic study I will go ahead and order this and allow the breast center to decide how they want to pursue this. I think her discomfort is physiologic as its bilateral comes and goes. His lungs patient has no palpable abnormalities and the symptoms come and go she'll monitor. If they persist worsen or she certainly feels anything abnormal she knows to followup ASAP. 2. Lower abdominal discomfort. Does have some GI symptoms of constipation and bloating with gas. Status post TAH/BSO last year at [facility]. Urinalysis is consistent with UTI. Will treat with ciprofloxacin twice a day x7 days. Is on Macrodantin for UTI suppression by Dr. Earlene Plater. I asked her to stop  it for now until she completes the ciprofloxacin that reinitiated. If recurrent UTIs and she'll see Dr. Earlene Plater in followup. If her [place] discomfort continues despite treatment then I will recommend GI followup. 3. Vaginal discharge and odor with itching. Wet prep is consistent with bacterial vaginosis. We'll treat with Cleocin vaginal cream nightly x7 days. Followup if symptoms persist, worsen or recur.

## 2012-10-24 NOTE — Patient Instructions (Signed)
Take ciprofloxacin antibiotic twice daily for 7 days. If urinary symptoms would continue then you need to see Dr. Earlene Plater in followup. Use Cleocin vaginal cream nightly for 7 days. If symptoms persist, worsen or recur follow up with me. Office will contact you about scheduling diagnostic study.

## 2012-10-26 LAB — URINE CULTURE

## 2012-10-27 NOTE — Telephone Encounter (Signed)
Appt.  11/08/12 2 10:00 am

## 2012-10-30 ENCOUNTER — Encounter: Payer: Self-pay | Admitting: Internal Medicine

## 2012-11-08 ENCOUNTER — Ambulatory Visit
Admission: RE | Admit: 2012-11-08 | Discharge: 2012-11-08 | Disposition: A | Discharge status: Home / Self Care | Payer: Medicare Other | Source: Ambulatory Visit | Attending: Gynecology | Admitting: Gynecology

## 2012-11-08 DIAGNOSIS — N644 Mastodynia: Secondary | ICD-10-CM

## 2013-01-31 ENCOUNTER — Other Ambulatory Visit: Payer: Self-pay | Admitting: *Deleted

## 2013-01-31 ENCOUNTER — Telehealth: Payer: Self-pay | Admitting: Cardiovascular Disease

## 2013-01-31 DIAGNOSIS — I119 Hypertensive heart disease without heart failure: Secondary | ICD-10-CM

## 2013-01-31 DIAGNOSIS — E785 Hyperlipidemia, unspecified: Secondary | ICD-10-CM

## 2013-01-31 NOTE — Telephone Encounter (Signed)
informed patient that i have placed lab orders in the computer for her. She can go have blood drawn whenever she wants to.

## 2013-01-31 NOTE — Telephone Encounter (Signed)
Scheduled to see Dr Tresa Endo ion 02-15-13.He told her he wanted her to have lab work before her next appt.Would  You please mail her a lab order.Would you please call and let her know you are mailing it or faxing it to the lab.

## 2013-01-31 NOTE — Telephone Encounter (Signed)
Message forwarded to W. Waddell, CMA.  

## 2013-02-05 ENCOUNTER — Telehealth: Payer: Self-pay | Admitting: Cardiovascular Disease

## 2013-02-05 DIAGNOSIS — R5381 Other malaise: Secondary | ICD-10-CM

## 2013-02-05 LAB — CBC
HCT: 38.3 % (ref 36.0–46.0)
MCV: 76.1 fL — ABNORMAL LOW (ref 78.0–100.0)
RBC: 5.03 MIL/uL (ref 3.87–5.11)
WBC: 8.1 10*3/uL (ref 4.0–10.5)

## 2013-02-05 LAB — COMPREHENSIVE METABOLIC PANEL
ALT: 41 U/L — ABNORMAL HIGH (ref 0–35)
AST: 20 U/L (ref 0–37)
Albumin: 4.5 g/dL (ref 3.5–5.2)
BUN: 24 mg/dL — ABNORMAL HIGH (ref 6–23)
CO2: 32 mEq/L (ref 19–32)
Calcium: 10.3 mg/dL (ref 8.4–10.5)
Chloride: 100 mEq/L (ref 96–112)
Creat: 0.97 mg/dL (ref 0.50–1.10)
Potassium: 4.2 mEq/L (ref 3.5–5.3)

## 2013-02-05 LAB — LIPID PANEL
Cholesterol: 164 mg/dL (ref 0–200)
HDL: 54 mg/dL (ref >39)
Total CHOL/HDL Ratio: 3 Ratio

## 2013-02-05 NOTE — Telephone Encounter (Signed)
Amy at [facility] lab called and advised they drew labs this am and received add on order for TSH  They have drawn but we have to call their customer service to add the tsh on  Customer service [phone]

## 2013-02-05 NOTE — Telephone Encounter (Signed)
Call to [facility].  Lab added on.

## 2013-02-05 NOTE — Telephone Encounter (Signed)
Pt is at [facility] lab on [address]  There computer down for epic.  Needs lab slip faxed asap to lab  Fax [phone].

## 2013-02-05 NOTE — Telephone Encounter (Signed)
RN printed lab requisitions previously ordered by Burna Mortimer, CMA.  Will defer to Burna Mortimer to review and order of any additional labs.

## 2013-02-05 NOTE — Telephone Encounter (Signed)
Pt was calling stating that she needs to have her thyroid check to the lab work that was sent down to [facility]

## 2013-02-05 NOTE — Telephone Encounter (Signed)
Lab slips printed and faxed.  Returned call and pt informed.  Verbalized understanding.

## 2013-02-05 NOTE — Telephone Encounter (Signed)
Per Wanda,CMA, Dr. Tresa Endo ok to order.  Lab printed and faxed.  Pt aware.

## 2013-02-06 LAB — TSH: TSH: 0.827 u[IU]/mL (ref 0.350–4.500)

## 2013-02-09 ENCOUNTER — Encounter: Payer: Self-pay | Admitting: *Deleted

## 2013-02-15 ENCOUNTER — Encounter: Payer: Self-pay | Admitting: Cardiovascular Disease

## 2013-02-15 ENCOUNTER — Ambulatory Visit (INDEPENDENT_AMBULATORY_CARE_PROVIDER_SITE_OTHER): Payer: Medicare Other | Admitting: Cardiovascular Disease

## 2013-02-15 VITALS — BP 130/80 | HR 54 | Ht 65.0 in | Wt 243.3 lb

## 2013-02-15 DIAGNOSIS — G4730 Sleep apnea, unspecified: Secondary | ICD-10-CM

## 2013-02-15 DIAGNOSIS — I428 Other cardiomyopathies: Secondary | ICD-10-CM

## 2013-02-15 DIAGNOSIS — Z79899 Other long term (current) drug therapy: Secondary | ICD-10-CM

## 2013-02-15 DIAGNOSIS — E785 Hyperlipidemia, unspecified: Secondary | ICD-10-CM

## 2013-02-15 MED ORDER — CARVEDILOL PHOSPHATE ER 40 MG PO CP24: 40.0000 mg | ORAL_CAPSULE | Freq: Every evening | ORAL | Status: DC

## 2013-02-15 MED ORDER — VALSARTAN 160 MG PO TABS: 160.0000 mg | ORAL_TABLET | Freq: Two times a day (BID) | ORAL | Status: DC

## 2013-02-15 MED ORDER — ATORVASTATIN CALCIUM 40 MG PO TABS: 40.0000 mg | ORAL_TABLET | Freq: Every day | ORAL | Status: DC

## 2013-02-15 MED ORDER — DIGOXIN 125 MCG PO TABS: 125.0000 ug | ORAL_TABLET | Freq: Every day | ORAL | Status: DC

## 2013-02-15 MED ORDER — FUROSEMIDE 40 MG PO TABS: 40.0000 mg | ORAL_TABLET | Freq: Two times a day (BID) | ORAL | Status: DC

## 2013-02-15 MED ORDER — SPIRONOLACTONE 25 MG PO TABS: 12.5000 mg | ORAL_TABLET | Freq: Two times a day (BID) | ORAL | Status: DC

## 2013-02-15 NOTE — Patient Instructions (Addendum)
Your physician recommends that you schedule a follow-up appointment in: 2 months  Your physician has requested that you have an echocardiogram. Echocardiography is a painless test that uses sound waves to create images of your heart. It provides your doctor with information about the size and shape of your heart and how well your heart's chambers and valves are working. This procedure takes approximately one hour. There are no restrictions for this procedure.  Your physician has recommended you make the following change in your medication: CUT ATORVASTATIN TO 40 MG DAILY  Your physician recommends that you return for lab work in: 2 MONTHS CBC,CMP,LIPIDS

## 2013-02-25 ENCOUNTER — Encounter: Payer: Self-pay | Admitting: *Deleted

## 2013-02-25 NOTE — Progress Notes (Signed)
Quick Note:  Lab note sent to patient. ______

## 2013-03-11 ENCOUNTER — Encounter: Payer: Self-pay | Admitting: Cardiovascular Disease

## 2013-03-11 DIAGNOSIS — I428 Other cardiomyopathies: Secondary | ICD-10-CM | POA: Insufficient documentation

## 2013-03-11 NOTE — Progress Notes (Signed)
Patient ID: Jamie Ayers, female   DOB: 1945/07/17, 67 y.o.   MRN: [id number]     HPI: Jamie Ayers, is a 67 y.o. female who presents to the office for six-month cardiology evaluation.  Ms. Jamie Ayers has a remote history of a nonischemic cardiopathy and in December 2005 her ejection fraction was 20%. With aggressive medical therapy LV function normalized to approximately 55%. She has a history of severe obstructive sleep apnea and has been utilizing CPAP therapy since 2008. 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 She also has undergone papilloma from a breast duct removal at the time of the hysterectomy.  Over the past 6 months, she has felt well. She is sleeping but her CPAP 100% of the time. She denies chest pain. She denies PND or orthopnea. She denies tachycardia. Laboratory on August 18 revealed a BUN of 24 creatinine 0.97. She had mild ALT elevation at 41 but otherwise normal AST alkaline phosphatase. She does have Microcytic indices. Hemoglobin 12.1 hematocrit 38.3 total cholesterol is 164 triglycerides 92 HDL 54 LDL 92.  Past Medical History  Diagnosis Date  . Menorrhagia     Fibroids  . Fibroid   . Endocervical polyp   . Anemia   . Disorder of vocal cord   . Hernia   . CHF (congestive heart failure)   . GERD (gastroesophageal reflux disease)   . Heart murmur   . Thyroid disease   . Upper respiratory infection   . Asthmatic bronchitis   . CHF (congestive heart failure)   . GERD (gastroesophageal reflux disease)   . Anxiety disorder   . OSA on CPAP 03/28/07    sleep study-[facility] 9.42/HR and during REM sleep @37 .50/hr. The average 02 sat duringREM AND NREM was 97.0%  . Benign essential HTN 08/06/11    ECHO-EF>55%  . Hyperlipidemia 03/14/12    Lexiscan myoview-WNL; unchanged from previous study.    Past Surgical History  Procedure Laterality Date    . Vocal cord surg    . Hysteroscopy    . Endometrial biopsy    . Dilation and curettage of uterus    . Nissen fundoplication    . Parathyroid surg    . Hernia repair    . Abdominal hysterectomy  2013    TAH BSO  . Oophorectomy      BSO  . Breast surgery      Papilloma    Allergies  Allergen Reactions  . Midazolam Hcl     Cardiac arrest    VERSED  . Codeine Hives and Itching  . Sulfonamide Derivatives Hives and Itching    Current Outpatient Prescriptions  Medication Sig Dispense Refill  . albuterol (PROVENTIL HFA;VENTOLIN HFA) 108 (90 BASE) MCG/ACT inhaler Inhale 2 puffs into the lungs every 4 (four) hours as needed for wheezing.  3.7 g  0  . aspirin 81 MG tablet Take 162 mg by mouth at bedtime.       Marland Kitchen atorvastatin (LIPITOR) 40 MG tablet Take 1 tablet (40 mg total) by mouth daily.  90 tablet  3  . Calcium Carbonate-Vitamin D (CALCIUM + D PO) Take 1 tablet by mouth daily.       . carvedilol (COREG CR) 40 MG 24 hr capsule Take 1 capsule (40 mg total) by mouth every evening.  90 capsule  3  . Dextromethorphan-Guaifenesin (MUCINEX DM MAXIMUM STRENGTH) 60-1200 MG TB12  Take 1 tablet by mouth daily.      . digoxin (LANOXIN) 0.125 MG tablet Take 1 tablet (125 mcg total) by mouth daily.  90 tablet  3  . esomeprazole (NEXIUM) 40 MG capsule Take 40 mg by mouth daily.        . furosemide (LASIX) 40 MG tablet Take 1 tablet (40 mg total) by mouth 2 (two) times daily.  180 tablet  3  . ibuprofen (ADVIL,MOTRIN) 800 MG tablet Take 1 tablet (800 mg total) by mouth every 8 (eight) hours as needed for pain.  30 tablet  12  . levocetirizine (XYZAL) 5 MG tablet Take 5 mg by mouth every evening.      . montelukast (SINGULAIR) 10 MG tablet Take 10 mg by mouth at bedtime.      . Multiple Vitamin (MULTIVITAMIN) tablet Take 1 tablet by mouth daily.        Marland Kitchen spironolactone (ALDACTONE) 25 MG tablet Take 0.5 tablets (12.5 mg total) by mouth 2 (two) times daily.  90 tablet  3  . valsartan (DIOVAN) 160 MG  tablet Take 1 tablet (160 mg total) by mouth 2 (two) times daily.  180 tablet  3  . vitamin E 400 UNIT capsule Take 400 Units by mouth daily.      Marland Kitchen zolpidem (AMBIEN) 10 MG tablet Take 10 mg by mouth at bedtime as needed. For sleep       No current facility-administered medications for this visit.    History   Social History  . Marital Status: Married    Spouse Name: N/A    Number of Children: N/A  . Years of Education: N/A   Occupational History  . Not on file.   Social History Main Topics  . Smoking status: Never Smoker   . Smokeless tobacco: Not on file  . Alcohol Use: No  . Drug Use: No  . Sexual Activity: Yes    Birth Control/ Protection: Post-menopausal, Surgical   Other Topics Concern  . Not on file   Social History Narrative  . No narrative on file    Family History  Problem Relation Age of Onset  . Diabetes Father   . Hypertension Father   . Cancer Father     Prostate cancer  . Arthritis Father   . Gout Father   . Hypertension Mother   . Stroke Mother   . Breast cancer Maternal Grandmother     Age 68's  . Ovarian cancer Paternal Grandmother   . Breast cancer Sister     Age 55  . Kidney disease Sister     ROS is negative for fevers, chills or night sweats. She denies any increasing shortness of breath. She denies chest pain. She denies presyncope or syncope. There is no wheezing. She denies residual daytime sleepiness. She uses her CPAP every night. She denies abdominal pain. She denies bleeding. She denies recent edema.    Other system review is negative.  PE BP 130/80  Pulse 54  Ht 5\' 5"  (1.651 m)  Wt 243 lb 4.8 oz (110.36 kg)  BMI 40.49 kg/m2  General: Alert, oriented, no distress.  Skin: normal turgor, no rashes HEENT: Normocephalic, atraumatic. Pupils round and reactive; sclera anicteric;no lid lag.  Nose without nasal septal hypertrophy Mouth/Parynx benign; Mallinpatti scale 3 Neck: Thick neck No JVD, no carotid briuts Lungs: clear to  ausculatation and percussion; no wheezing or rales Heart: RRR, s1 s2 normal 2/6 systolic murmur Abdomen: soft, nontender; no hepatosplenomehaly, BS+; abdominal  aorta nontender and not dilated by palpation. Pulses 2+ Extremities: no clubbing cyanosis or edema, Homan's sign negative  Neurologic: grossly nonfocal  ECG: Sinus bradycardia 54 beats per minute. Borderline first degree AV block with a PR interval 210 ms.  LABS:  BMET    Component Value Date/Time   NA 140 02/05/2013 1021   K 4.2 02/05/2013 1021   CL 100 02/05/2013 1021   CO2 32 02/05/2013 1021   GLUCOSE 144* 02/05/2013 1021   BUN 24* 02/05/2013 1021   CREATININE 0.97 02/05/2013 1021   CREATININE 1.01 02/18/2012 1858   CALCIUM 10.3 02/05/2013 1021   GFRNONAA 57* 02/18/2012 1858   GFRAA 66* 02/18/2012 1858     Hepatic Function Panel     Component Value Date/Time   PROT 6.9 02/05/2013 1021   ALBUMIN 4.5 02/05/2013 1021   AST 20 02/05/2013 1021   ALT 41* 02/05/2013 1021   ALKPHOS 82 02/05/2013 1021   BILITOT 1.0 02/05/2013 1021     CBC    Component Value Date/Time   WBC 8.1 02/05/2013 1014   RBC 5.03 02/05/2013 1014   HGB 12.1 02/05/2013 1014   HCT 38.3 02/05/2013 1014   PLT 247 02/05/2013 1014   MCV 76.1* 02/05/2013 1014   MCH 24.1* 02/05/2013 1014   MCHC 31.6 02/05/2013 1014   RDW 16.6* 02/05/2013 1014   LYMPHSABS 2.1 02/18/2012 1858   MONOABS 0.6 02/18/2012 1858   EOSABS 0.0 02/18/2012 1858   BASOSABS 0.0 02/18/2012 1858     BNP No results found for this basename: probnp    Lipid Panel     Component Value Date/Time   CHOL 164 02/05/2013 1008   TRIG 92 02/05/2013 1008   HDL 54 02/05/2013 1008   CHOLHDL 3.0 02/05/2013 1008   VLDL 18 02/05/2013 1008   LDLCALC 92 02/05/2013 1008     RADIOLOGY: No results found.    ASSESSMENT AND PLAN: From a cardiac standpoint, Ms. Riemann continues to be fairly well compensated. In December 2005 ejection fraction was 20% and she had a nonischemic cardiomyopathy. Her last echo Doppler  study in February 2013 showed mild concentric LVH with an ejection fraction greater than 55%. She had grade 1 diastolic dysfunction. She did have mild-to-moderate mitral regurgitation. There was evidence for mild aortic sclerosis with mild AR. She is continuing to use her CPAP therapy. With her mild ALT elevation I am recommending slight reduction of her atorvastatin to 40 mg her current dose of 80. In 2 months she will undergo repeat laboratory. She will also undergo a followup echo Doppler study in followup of her remote nonischemic myopathy and diastolic dysfunction.     Lennette Bihari, MD, [facility]  03/11/2013 4:03 PM

## 2013-03-20 ENCOUNTER — Ambulatory Visit (HOSPITAL_COMMUNITY): Payer: Medicare Other

## 2013-03-20 ENCOUNTER — Ambulatory Visit (HOSPITAL_COMMUNITY)
Admission: RE | Admit: 2013-03-20 | Discharge: 2013-03-20 | Disposition: A | Discharge status: Home / Self Care | Payer: Medicare Other | Source: Ambulatory Visit | Attending: Cardiovascular Disease | Admitting: Cardiovascular Disease

## 2013-03-20 DIAGNOSIS — I428 Other cardiomyopathies: Secondary | ICD-10-CM | POA: Insufficient documentation

## 2013-03-20 NOTE — Progress Notes (Signed)
  Echocardiogram 2D Echocardiogram has been performed.  Blanche Gallien, [facility] 03/20/2013, 10:46 AM

## 2013-03-23 ENCOUNTER — Encounter: Payer: Self-pay | Admitting: *Deleted

## 2013-03-23 NOTE — Progress Notes (Signed)
Quick Note:  Echo result note sent To patient. ______

## 2013-03-28 ENCOUNTER — Encounter: Payer: Self-pay | Admitting: Gynecology

## 2013-04-11 ENCOUNTER — Telehealth: Payer: Self-pay | Admitting: Cardiovascular Disease

## 2013-04-11 NOTE — Telephone Encounter (Signed)
Has appt with Dr Tresa Endo next week.  Wants to get her labwork done this morning at [facility] [place]  Needs lab slip  Please call .

## 2013-04-11 NOTE — Telephone Encounter (Signed)
Returned call and pt verified x 2.  Pt informed labs ordered at last visit and all she has to do is show up at the lab F[place] to have drawn.  Pt verbalized understanding and agreed w/ plan.

## 2013-04-17 ENCOUNTER — Encounter: Payer: Self-pay | Admitting: Cardiovascular Disease

## 2013-04-17 ENCOUNTER — Ambulatory Visit (INDEPENDENT_AMBULATORY_CARE_PROVIDER_SITE_OTHER): Payer: Medicare Other | Admitting: Cardiovascular Disease

## 2013-04-17 VITALS — BP 122/82 | HR 62 | Ht 65.0 in | Wt 246.2 lb

## 2013-04-17 DIAGNOSIS — K219 Gastro-esophageal reflux disease without esophagitis: Secondary | ICD-10-CM

## 2013-04-17 DIAGNOSIS — I255 Ischemic cardiomyopathy: Secondary | ICD-10-CM

## 2013-04-17 DIAGNOSIS — I10 Essential (primary) hypertension: Secondary | ICD-10-CM

## 2013-04-17 DIAGNOSIS — I428 Other cardiomyopathies: Secondary | ICD-10-CM

## 2013-04-17 DIAGNOSIS — G4730 Sleep apnea, unspecified: Secondary | ICD-10-CM

## 2013-04-17 DIAGNOSIS — I2589 Other forms of chronic ischemic heart disease: Secondary | ICD-10-CM

## 2013-04-17 DIAGNOSIS — E785 Hyperlipidemia, unspecified: Secondary | ICD-10-CM

## 2013-04-17 NOTE — Patient Instructions (Signed)
Your physician wants you to follow-up in:  6 months. You will receive a reminder letter in the mail two months in advance. If you don't receive a letter, please call our office to schedule the follow-up appointment.   

## 2013-04-23 ENCOUNTER — Encounter: Payer: Self-pay | Admitting: Obstetrics and Gynecology

## 2013-04-23 ENCOUNTER — Encounter: Payer: Self-pay | Admitting: Gynecology

## 2013-04-23 ENCOUNTER — Ambulatory Visit (INDEPENDENT_AMBULATORY_CARE_PROVIDER_SITE_OTHER): Payer: Medicare Other | Admitting: Gynecology

## 2013-04-23 VITALS — BP 124/82 | Ht 63.0 in | Wt 242.0 lb

## 2013-04-23 DIAGNOSIS — N952 Postmenopausal atrophic vaginitis: Secondary | ICD-10-CM

## 2013-04-23 DIAGNOSIS — N760 Acute vaginitis: Secondary | ICD-10-CM

## 2013-04-23 DIAGNOSIS — Z780 Asymptomatic menopausal state: Secondary | ICD-10-CM

## 2013-04-23 DIAGNOSIS — J45901 Unspecified asthma with (acute) exacerbation: Secondary | ICD-10-CM

## 2013-04-23 MED ORDER — NONFORMULARY OR COMPOUNDED ITEM: Status: DC

## 2013-04-23 MED ORDER — FLUCONAZOLE 150 MG PO TABS: 150.0000 mg | ORAL_TABLET | Freq: Once | ORAL | Status: DC

## 2013-04-23 NOTE — Progress Notes (Signed)
Jamie Ayers [birth year] [id number]        67 y.o.  [id number] for followup exam.  Former patient of Dr. Eda Paschal. Several issues noted below.  Past medical history,surgical history, problem list, medications, allergies, family history and social history were all reviewed and documented in the EPIC chart.  ROS:  Performed and pertinent positives and negatives are included in the history, assessment and plan .  Exam: Jamie Ayers assistant Filed Vitals:   04/23/13 1110  BP: 124/82  Height: 5\' 3"  (1.6 m)  Weight: 242 lb (109.77 kg)   General appearance  Normal Skin grossly normal Head/Neck normal with no cervical or supraclavicular adenopathy thyroid normal Lungs  bilateral wheezing Cardiac RR, without RMG Abdominal  soft, nontender, without masses, organomegaly or hernia Breasts  examined lying and sitting without masses, retractions, discharge or axillary adenopathy. Pelvic  Ext/BUS/vagina  with atrophic changes  Adnexa  Without masses or tenderness    Anus and perineum  normal   Rectovaginal  normal sphincter tone without palpated masses or tenderness.    Assessment/Plan:  68 y.o. [id number] female for followup exam.   1. Postmenopausal/atrophic genital changes. Status post TAH/BSO for leiomyomata. Doing well over all without significant hot flushes, night sweats, vaginal dryness or dyspareunia. Will continue to monitor. 2. Recurrent vaginitis. Due to bouts of steroid use and antibiotic use for her respiratory has recurrent vaginitis that has been treated with intermittent Diflucan and boric acid suppositories per Dr. Eda Paschal. Diflucan 150 mg #12 provided to have available to use one pill as needed no refill and boric acid 600 mg suppositories #30 to be used as needed with 3 refills. 3. Mammography 08/2012. Continue with annual mammography. Recommended she consider 3-D with her next mammography. SBE monthly reviewed. 4. DEXA 2009 normal. Recommend repeat DEXA this year at five-year intervals  she has been using steroids fairly routinely. Increase calcium vitamin D reviewed. 5. Pap smear 2012. No Pap smear done today. No history of significant abnormal Pap smears. Status post hysterectomy for benign indications. Over the age of 27. Options to stop screening altogether versus less frequent screening intervals reviewed. Will readdress on an annual basis. 6. Colonoscopy 2012. Repeat at their recommended interval. 7. Health maintenance. 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 She is wheezing today and is being seen by her pulmonologist actively. No blood work done as it is all done through her other physician's offices. Followup one year, sooner as needed.  Note: This document was prepared with digital dictation and possible smart phrase technology. Any transcriptional errors that result from this process are unintentional.   Dara Lords MD, 11:53 AM 04/23/2013

## 2013-04-23 NOTE — Patient Instructions (Signed)
Followup for bone density as scheduled. 

## 2013-04-24 LAB — URINALYSIS W MICROSCOPIC + REFLEX CULTURE
Bacteria, UA: NONE SEEN
Bilirubin Urine: NEGATIVE
Casts: NONE SEEN
Hgb urine dipstick: NEGATIVE
Ketones, ur: NEGATIVE mg/dL
Nitrite: NEGATIVE
pH: 5.5 (ref 5.0–8.0)

## 2013-04-25 ENCOUNTER — Other Ambulatory Visit: Payer: Self-pay | Admitting: Gynecology

## 2013-04-25 LAB — URINE CULTURE: Colony Count: 15000

## 2013-04-25 MED ORDER — NITROFURANTOIN MONOHYD MACRO 100 MG PO CAPS: 100.0000 mg | ORAL_CAPSULE | Freq: Two times a day (BID) | ORAL | Status: DC

## 2013-04-25 MED ORDER — CIPROFLOXACIN HCL 250 MG PO TABS: 250.0000 mg | ORAL_TABLET | Freq: Two times a day (BID) | ORAL | Status: DC

## 2013-05-19 ENCOUNTER — Encounter: Payer: Self-pay | Admitting: Cardiovascular Disease

## 2013-05-19 NOTE — Progress Notes (Signed)
Patient ID: Jamie Ayers, female   DOB: 02-Nov-1945, 67 y.o.   MRN: [id number]      HPI: Jamie Ayers, is a 67 y.o. female who presents to the office for 2 month cardiology evaluation.  Jamie Ayers has a remote history of a nonischemic cardiomyopathy.  In December 2005 her ejection fraction was 20%. With aggressive medical therapy LV function normalized to approximately 55%. She has a history of severe obstructive sleep apnea and has been utilizing CPAP therapy since 2008. 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 She also has undergone papilloma from a breast duct removal at the time of the hysterectomy.  Over the past 6 months, she has felt well. She is sleeping but her CPAP 100% of the time. She denies chest pain. She denies PND or orthopnea. She denies tachycardia. Laboratory on August 18 revealed a BUN of 24 creatinine 0.97. She had mild ALT elevation at 41 but otherwise normal AST alkaline phosphatase. She does have Microcytic indices. Hemoglobin 12.1 hematocrit 38.3 total cholesterol is 164 triglycerides 92 HDL 54 LDL 92. When I last saw her, recommended reduction of her lovastatin dose from 80 mg to 40 mg. I also recommended a followup echo Doppler study to reassess her cardiomyopathy and diastolic function as well as subsequent followup blood work in 2 months.  An echo Doppler study was done 03/20/2013. This shows an ejection fraction at 50-55% with mild concentric left ventricular hypertrophy. She had normal diastolic function. There was evidence for mild aortic insufficiency and mild mitral regurg patient with a centrally directed jet. Her left atrium was mildly dilated. She recently had repeat laboratory done by Jamie Ayers. She had microcytic indices with MCV of 76.9 with a normal hemoglobin at 12.5 and hematocrit of 40.3. Creatinine was 1.08. Glucose was elevated at 140. Total cholesterol is  181 triglycerides 118 HDL 57 and LDL 100. Hemoglobin of A1c was 7.3. Digoxin level 0.7. Liver function studies are now normal with an AST of 20 and ALT of 35.  Past Medical History  Diagnosis Date  . Anemia   . Disorder of vocal cord   . Hernia   . CHF (congestive heart failure)   . GERD (gastroesophageal reflux disease)   . Heart murmur   . Thyroid disease   . Upper respiratory infection   . Asthmatic bronchitis   . CHF (congestive heart failure)   . GERD (gastroesophageal reflux disease)   . Anxiety disorder   . OSA on CPAP 03/28/07    sleep study-[facility] 9.42/HR and during REM sleep @37 .50/hr. The average 02 sat duringREM AND NREM was 97.0%  . Benign essential HTN 08/06/11    ECHO-EF>55%  . Hyperlipidemia 03/14/12    Lexiscan myoview-WNL; unchanged from previous study.    Past Surgical History  Procedure Laterality Date  . Vocal cord surg    . Hysteroscopy    . Endometrial biopsy    . Dilation and curettage of uterus    . Nissen fundoplication    . Parathyroid surg    . Hernia repair    . Abdominal hysterectomy  2013    TAH BSO  . Oophorectomy      BSO  . Breast surgery      Papilloma    Allergies  Allergen Reactions  . Midazolam Hcl     Cardiac arrest    VERSED  . Codeine  Hives and Itching  . Sulfonamide Derivatives Hives and Itching    Current Outpatient Prescriptions  Medication Sig Dispense Refill  . aspirin 81 MG tablet Take 162 mg by mouth at bedtime.       Marland Kitchen atorvastatin (LIPITOR) 40 MG tablet Take 1 tablet (40 mg total) by mouth daily.  90 tablet  3  . Calcium Carbonate-Vitamin D (CALCIUM + D PO) Take 1 tablet by mouth daily.       . carvedilol (COREG CR) 40 MG 24 hr capsule Take 1 capsule (40 mg total) by mouth every evening.  90 capsule  3  . Dextromethorphan-Guaifenesin (MUCINEX DM MAXIMUM STRENGTH) 60-1200 MG TB12 Take 1 tablet by mouth daily.      . digoxin (LANOXIN) 0.125 MG tablet Take 1 tablet (125 mcg total) by  mouth daily.  90 tablet  3  . esomeprazole (NEXIUM) 40 MG capsule Take 40 mg by mouth daily.        . furosemide (LASIX) 40 MG tablet Take 1 tablet (40 mg total) by mouth 2 (two) times daily.  180 tablet  3  . ibuprofen (ADVIL,MOTRIN) 800 MG tablet Take 1 tablet (800 mg total) by mouth every 8 (eight) hours as needed for pain.  30 tablet  12  . montelukast (SINGULAIR) 10 MG tablet Take 10 mg by mouth at bedtime.      . moxifloxacin (AVELOX) 400 MG tablet Take 400 mg by mouth daily.      . Multiple Vitamin (MULTIVITAMIN) tablet Take 1 tablet by mouth daily.        Marland Kitchen spironolactone (ALDACTONE) 25 MG tablet Take 0.5 tablets (12.5 mg total) by mouth 2 (two) times daily.  90 tablet  3  . valsartan (DIOVAN) 160 MG tablet Take 1 tablet (160 mg total) by mouth 2 (two) times daily.  180 tablet  3  . vitamin E 400 UNIT capsule Take 400 Units by mouth daily.      Marland Kitchen zolpidem (AMBIEN) 10 MG tablet Take 10 mg by mouth at bedtime as needed. For sleep      . albuterol (PROVENTIL HFA;VENTOLIN HFA) 108 (90 BASE) MCG/ACT inhaler Inhale 2 puffs into the lungs every 4 (four) hours as needed for wheezing.  3.7 g  0  . fluconazole (DIFLUCAN) 150 MG tablet Take 1 tablet (150 mg total) by mouth once. As needed for yeast  12 tablet  0  . nitrofurantoin, macrocrystal-monohydrate, (MACROBID) 100 MG capsule Take 1 capsule (100 mg total) by mouth 2 (two) times daily.  14 capsule  0  . NONFORMULARY OR COMPOUNDED ITEM Boric acid vaginal suppositories 600 mg one per vagina as needed for yeast  30 each  3   No current facility-administered medications for this visit.    History   Social History  . Marital Status: Married    Spouse Name: N/A    Number of Children: N/A  . Years of Education: N/A   Occupational History  . Not on file.   Social History Main Topics  . Smoking status: Never Smoker   . Smokeless tobacco: Not on file  . Alcohol Use: No  . Drug Use: No  . Sexual Activity: Yes    Birth Control/ Protection:  Post-menopausal, Surgical     Comment: HYST   Other Topics Concern  . Not on file   Social History Narrative  . No narrative on file    Family History  Problem Relation Age of Onset  . Diabetes Father   .  Hypertension Father   . Cancer Father     Prostate cancer  . Arthritis Father   . Gout Father   . Hypertension Mother   . Stroke Mother   . Breast cancer Maternal Grandmother     Age 44's  . Ovarian cancer Paternal Grandmother   . Breast cancer Sister     Age 31  . Kidney disease Sister    Social history is notable in that she is married has 4 children 9 grandchildren. There is no tobacco or alcohol use.   ROS is negative for fevers, chills or night sweats. She denies rash. She denies visual changes. She has had issues with hoarseness in the past to verbal cord papillomas. She denies any increasing shortness of breath. She denies chest pain. She denies presyncope or syncope. There is no wheezing. She denies any nausea vomiting or diarrhea. She is unaware of any blood in her stool or urine. She denies claudication. She denies residual daytime sleepiness. She uses her CPAP every night. She denies abdominal pain. She denies bleeding. She denies recent edema.   Dr. August Saucer is managing her diabetes.  Other comprehensive 12 system review is negative.  PE BP 122/82  Pulse 62  Ht 5\' 5"  (1.651 m)  Wt 246 lb 3.2 oz (111.676 kg)  BMI 40.97 kg/m2  Body Mass index is concordant with morbid obesity. There is no significantly changed from her last office visit. General: Alert, oriented, no distress.  Skin: normal turgor, no rashes HEENT: Normocephalic, atraumatic. Pupils round and reactive; sclera anicteric;no lid lag.  Nose without nasal septal hypertrophy Mouth/Parynx benign; Mallinpatti scale 3 Neck: Thick neck No JVD, no carotid briuts Lungs: clear to ausculatation and percussion; no wheezing or rales Heart: RRR, s1 s2 normal 2/6 systolic murmur Abdomen: soft, nontender; no  hepatosplenomehaly, BS+; abdominal aorta nontender and not dilated by palpation. Pulses 2+ Extremities: no clubbing cyanosis or edema, Homan's sign negative  Neurologic: grossly nonfocal Psychologic: Normal affect and mood   ECG: Normal sinus rhythm at 62 beats per minute. Borderline first degree AV block is no longer present with her PR interval now at 184 ms.  LABS:  BMET    Component Value Date/Time   NA 140 02/05/2013 1021   K 4.2 02/05/2013 1021   CL 100 02/05/2013 1021   CO2 32 02/05/2013 1021   GLUCOSE 144* 02/05/2013 1021   BUN 24* 02/05/2013 1021   CREATININE 0.97 02/05/2013 1021   CREATININE 1.01 02/18/2012 1858   CALCIUM 10.3 02/05/2013 1021   GFRNONAA 57* 02/18/2012 1858   GFRAA 66* 02/18/2012 1858     Hepatic Function Panel     Component Value Date/Time   PROT 6.9 02/05/2013 1021   ALBUMIN 4.5 02/05/2013 1021   AST 20 02/05/2013 1021   ALT 41* 02/05/2013 1021   ALKPHOS 82 02/05/2013 1021   BILITOT 1.0 02/05/2013 1021     CBC    Component Value Date/Time   WBC 8.1 02/05/2013 1014   RBC 5.03 02/05/2013 1014   HGB 12.1 02/05/2013 1014   HCT 38.3 02/05/2013 1014   PLT 247 02/05/2013 1014   MCV 76.1* 02/05/2013 1014   MCH 24.1* 02/05/2013 1014   MCHC 31.6 02/05/2013 1014   RDW 16.6* 02/05/2013 1014   LYMPHSABS 2.1 02/18/2012 1858   MONOABS 0.6 02/18/2012 1858   EOSABS 0.0 02/18/2012 1858   BASOSABS 0.0 02/18/2012 1858     BNP No results found for this basename: probnp    Lipid Panel  Component Value Date/Time   CHOL 164 02/05/2013 1008   TRIG 92 02/05/2013 1008   HDL 54 02/05/2013 1008   CHOLHDL 3.0 02/05/2013 1008   VLDL 18 02/05/2013 1008   LDLCALC 92 02/05/2013 1008     RADIOLOGY: No results found.    ASSESSMENT AND PLAN: From a cardiac standpoint, Ms. Laforest continues to to do well and remains compensated. Her ejection fraction is 50-55% without focal wall motion abnormalities. In December 2005 ejection fraction was 20% and she had a nonischemic  cardiomyopathy. Her recent mild LFT elevation has improved with slight reduction of her atorvastatin dose from 80 mg to 40 mg. LFTs are now normal. LDL cholesterol is 100. She does not have coronary shock of disease. Her blood pressure today is controlled. We did discuss the importance of weight loss and increased exercise. She continues to use her CPAP and is unaware of breakthrough snoring. There is no residual daytime sleepiness. As long as she remained cardiac stable, I will see her in 6 months for reevaluation.   Lennette Bihari, MD, [facility]  05/19/2013 11:36 AM

## 2013-06-01 ENCOUNTER — Telehealth: Payer: Self-pay | Admitting: Cardiovascular Disease

## 2013-06-01 MED ORDER — ESOMEPRAZOLE MAGNESIUM 40 MG PO CPDR: 40.0000 mg | DELAYED_RELEASE_CAPSULE | Freq: Every day | ORAL | Status: DC

## 2013-06-01 NOTE — Telephone Encounter (Signed)
Returned call and pt verified x 2.  Pt need refill on Nexium.  Refill(s) sent to pharmacy.

## 2013-06-01 NOTE — Telephone Encounter (Signed)
Need you to call in her Nexium 40mg  #180.This have to be called into [facility] by [phone]

## 2013-06-18 ENCOUNTER — Telehealth: Payer: Self-pay | Admitting: Cardiovascular Disease

## 2013-06-18 MED ORDER — ESOMEPRAZOLE MAGNESIUM 40 MG PO CPDR: 40.0000 mg | DELAYED_RELEASE_CAPSULE | Freq: Two times a day (BID) | ORAL | Status: DC

## 2013-06-18 NOTE — Telephone Encounter (Signed)
Her Nexium 40mg  was received from [facility] with the incorrect dose  She takes 2 pills 40mg  per day.  She received only 30 pills (1 per day)  Needs 90 day supply with 2 refills.  Please call to correct.

## 2013-06-18 NOTE — Telephone Encounter (Signed)
Called patient to clarify dosage and freq of nexium. States has been taking 40mg  BID (AM & PM) since 1996. Rx was sent to pharmacy electronically.

## 2013-07-30 ENCOUNTER — Other Ambulatory Visit: Payer: Self-pay | Admitting: Urology

## 2013-07-30 DIAGNOSIS — N281 Cyst of kidney, acquired: Secondary | ICD-10-CM

## 2013-08-02 ENCOUNTER — Other Ambulatory Visit: Payer: Medicare Other

## 2013-08-03 ENCOUNTER — Other Ambulatory Visit: Payer: Medicare Other

## 2013-08-09 ENCOUNTER — Other Ambulatory Visit: Payer: Medicare Other

## 2013-08-14 ENCOUNTER — Other Ambulatory Visit: Payer: Self-pay

## 2013-08-14 DIAGNOSIS — Z1231 Encounter for screening mammogram for malignant neoplasm of breast: Secondary | ICD-10-CM

## 2013-08-16 ENCOUNTER — Other Ambulatory Visit: Payer: Medicare Other

## 2013-08-28 ENCOUNTER — Ambulatory Visit
Admission: RE | Admit: 2013-08-28 | Discharge: 2013-08-28 | Disposition: A | Discharge status: Home / Self Care | Payer: Medicare Other | Source: Ambulatory Visit | Attending: Urology | Admitting: Urology

## 2013-08-28 DIAGNOSIS — N281 Cyst of kidney, acquired: Secondary | ICD-10-CM

## 2013-09-05 ENCOUNTER — Ambulatory Visit
Admission: RE | Admit: 2013-09-05 | Discharge: 2013-09-05 | Disposition: A | Discharge status: Home / Self Care | Payer: Medicare Other | Source: Ambulatory Visit

## 2013-09-05 DIAGNOSIS — Z1231 Encounter for screening mammogram for malignant neoplasm of breast: Secondary | ICD-10-CM

## 2013-09-06 ENCOUNTER — Telehealth: Payer: Self-pay | Admitting: *Deleted

## 2013-09-06 MED ORDER — POLYSACCHARIDE IRON COMPLEX 150 MG PO CAPS: 150.0000 mg | ORAL_CAPSULE | Freq: Two times a day (BID) | ORAL | Status: DC

## 2013-09-06 NOTE — Telephone Encounter (Signed)
Okay to refill? 

## 2013-09-06 NOTE — Telephone Encounter (Signed)
Pt called requesting new Rx for her iron tablets Ferrex 150 mg, pt said Dr.Gottsegen prescribed had quiet a few pills left since her retired but now out. Please advise

## 2013-09-06 NOTE — Telephone Encounter (Signed)
rx sent, pt informed.  

## 2013-09-24 ENCOUNTER — Telehealth: Payer: Self-pay | Admitting: *Deleted

## 2013-09-24 NOTE — Telephone Encounter (Signed)
error 

## 2013-09-25 ENCOUNTER — Telehealth: Payer: Self-pay | Admitting: *Deleted

## 2013-09-25 NOTE — Telephone Encounter (Signed)
Pt informed with the below noter

## 2013-09-25 NOTE — Telephone Encounter (Signed)
Pt called stating the Rx for Ferrex 150 was wrong rx. Pt said it should be for Ferrex TM 150 I called pt mail order pharmacy and was informed they do not have this medication because its OTC. I called patient to relay information but had to leave a message for patient to call.

## 2013-09-27 ENCOUNTER — Other Ambulatory Visit: Payer: Self-pay | Admitting: Gynecology

## 2013-09-27 ENCOUNTER — Ambulatory Visit (INDEPENDENT_AMBULATORY_CARE_PROVIDER_SITE_OTHER): Payer: Medicare Other

## 2013-09-27 DIAGNOSIS — N958 Other specified menopausal and perimenopausal disorders: Secondary | ICD-10-CM

## 2013-09-27 DIAGNOSIS — Z13820 Encounter for screening for osteoporosis: Secondary | ICD-10-CM

## 2013-09-27 DIAGNOSIS — M899 Disorder of bone, unspecified: Secondary | ICD-10-CM

## 2013-09-27 DIAGNOSIS — Z8781 Personal history of (healed) traumatic fracture: Secondary | ICD-10-CM

## 2013-09-27 DIAGNOSIS — M84353A Stress fracture, unspecified femur, initial encounter for fracture: Secondary | ICD-10-CM

## 2013-09-27 DIAGNOSIS — Z780 Asymptomatic menopausal state: Secondary | ICD-10-CM

## 2013-09-27 DIAGNOSIS — M949 Disorder of cartilage, unspecified: Secondary | ICD-10-CM

## 2013-10-02 ENCOUNTER — Other Ambulatory Visit: Payer: Self-pay | Admitting: Gynecology

## 2013-10-02 DIAGNOSIS — N958 Other specified menopausal and perimenopausal disorders: Secondary | ICD-10-CM

## 2013-10-02 DIAGNOSIS — M949 Disorder of cartilage, unspecified: Secondary | ICD-10-CM

## 2013-10-02 DIAGNOSIS — Z8781 Personal history of (healed) traumatic fracture: Secondary | ICD-10-CM

## 2013-10-02 DIAGNOSIS — M899 Disorder of bone, unspecified: Secondary | ICD-10-CM

## 2013-10-03 ENCOUNTER — Other Ambulatory Visit: Payer: Self-pay | Admitting: Gynecology

## 2013-10-03 DIAGNOSIS — Z780 Asymptomatic menopausal state: Secondary | ICD-10-CM

## 2013-10-03 DIAGNOSIS — N958 Other specified menopausal and perimenopausal disorders: Secondary | ICD-10-CM

## 2013-10-03 DIAGNOSIS — M84353A Stress fracture, unspecified femur, initial encounter for fracture: Secondary | ICD-10-CM

## 2013-10-05 ENCOUNTER — Telehealth: Payer: Self-pay | Admitting: Cardiovascular Disease

## 2013-10-05 DIAGNOSIS — I10 Essential (primary) hypertension: Secondary | ICD-10-CM

## 2013-10-05 DIAGNOSIS — Z79899 Other long term (current) drug therapy: Secondary | ICD-10-CM

## 2013-10-05 DIAGNOSIS — R5383 Other fatigue: Principal | ICD-10-CM

## 2013-10-05 DIAGNOSIS — R5381 Other malaise: Secondary | ICD-10-CM

## 2013-10-05 DIAGNOSIS — E559 Vitamin D deficiency, unspecified: Secondary | ICD-10-CM

## 2013-10-05 LAB — CBC
HCT: 36 % (ref 36.0–46.0)
Hemoglobin: 11.6 g/dL — ABNORMAL LOW (ref 12.0–15.0)
MCH: 23.7 pg — ABNORMAL LOW (ref 26.0–34.0)
MCHC: 32.2 g/dL (ref 30.0–36.0)
MCV: 73.5 fL — AB (ref 78.0–100.0)
PLATELETS: 241 10*3/uL (ref 150–400)
RBC: 4.9 MIL/uL (ref 3.87–5.11)
RDW: 16.1 % — ABNORMAL HIGH (ref 11.5–15.5)
WBC: 7.4 10*3/uL (ref 4.0–10.5)

## 2013-10-05 LAB — COMPREHENSIVE METABOLIC PANEL
ALT: 20 U/L (ref 0–35)
AST: 14 U/L (ref 0–37)
Albumin: 4.1 g/dL (ref 3.5–5.2)
Alkaline Phosphatase: 86 U/L (ref 39–117)
BUN: 17 mg/dL (ref 6–23)
CALCIUM: 9.6 mg/dL (ref 8.4–10.5)
CO2: 29 mEq/L (ref 19–32)
CREATININE: 0.95 mg/dL (ref 0.50–1.10)
Chloride: 102 mEq/L (ref 96–112)
Glucose, Bld: 147 mg/dL — ABNORMAL HIGH (ref 70–99)
POTASSIUM: 4 meq/L (ref 3.5–5.3)
Sodium: 141 mEq/L (ref 135–145)
Total Bilirubin: 0.8 mg/dL (ref 0.2–1.2)
Total Protein: 6.9 g/dL (ref 6.0–8.3)

## 2013-10-05 NOTE — Telephone Encounter (Signed)
Returned a call to patient. She complains of having some fatique. Recently treated for URI by her PCP. Patient denies having any specific problems other than just being extremely tired. She informs me that the last time she" felt like this" Dr. Claiborne Billings told her she needed to take some iron. When she did she began to feel better. Recommended for her to keep her appointment on Monday with Dr. Claiborne Billings. Will order blood test for Dr. Claiborne Billings to have since her PCP hasn't drawn any. C-MET, CBC, TSH, VIT-D ( per patient request) will be ordered. Patient had coffee with cream and splenda this morning so she will get her Lipids done on Monday prior to her appointment.

## 2013-10-05 NOTE — Telephone Encounter (Signed)
Pt states she is just feeling bad.Pt wanted to be seen today by Dr Jannette Fogo told her he was not here.Pt will see whoever is available.

## 2013-10-06 LAB — TSH: TSH: 1.054 u[IU]/mL (ref 0.350–4.500)

## 2013-10-08 ENCOUNTER — Ambulatory Visit: Payer: Medicare Other | Admitting: Cardiovascular Disease

## 2013-10-08 ENCOUNTER — Ambulatory Visit (INDEPENDENT_AMBULATORY_CARE_PROVIDER_SITE_OTHER): Payer: Medicare Other | Admitting: Cardiovascular Disease

## 2013-10-08 ENCOUNTER — Encounter: Payer: Self-pay | Admitting: Cardiovascular Disease

## 2013-10-08 VITALS — BP 118/78 | HR 59 | Ht 65.0 in | Wt 247.8 lb

## 2013-10-08 DIAGNOSIS — G4733 Obstructive sleep apnea (adult) (pediatric): Secondary | ICD-10-CM

## 2013-10-08 DIAGNOSIS — G4730 Sleep apnea, unspecified: Secondary | ICD-10-CM

## 2013-10-08 DIAGNOSIS — E7800 Pure hypercholesterolemia, unspecified: Secondary | ICD-10-CM

## 2013-10-08 DIAGNOSIS — Z9989 Dependence on other enabling machines and devices: Secondary | ICD-10-CM

## 2013-10-08 DIAGNOSIS — R141 Gas pain: Secondary | ICD-10-CM

## 2013-10-08 DIAGNOSIS — R143 Flatulence: Secondary | ICD-10-CM

## 2013-10-08 DIAGNOSIS — I10 Essential (primary) hypertension: Secondary | ICD-10-CM

## 2013-10-08 DIAGNOSIS — D649 Anemia, unspecified: Secondary | ICD-10-CM

## 2013-10-08 DIAGNOSIS — D509 Iron deficiency anemia, unspecified: Secondary | ICD-10-CM

## 2013-10-08 DIAGNOSIS — R7989 Other specified abnormal findings of blood chemistry: Secondary | ICD-10-CM

## 2013-10-08 DIAGNOSIS — K219 Gastro-esophageal reflux disease without esophagitis: Secondary | ICD-10-CM

## 2013-10-08 DIAGNOSIS — R142 Eructation: Secondary | ICD-10-CM

## 2013-10-08 DIAGNOSIS — I428 Other cardiomyopathies: Secondary | ICD-10-CM

## 2013-10-08 LAB — IRON AND TIBC
%SAT: 27 % (ref 20–55)
Iron: 106 ug/dL (ref 42–145)
TIBC: 392 ug/dL (ref 250–470)
UIBC: 286 ug/dL (ref 125–400)

## 2013-10-08 LAB — LIPID PANEL
CHOL/HDL RATIO: 3.1 ratio
Cholesterol: 190 mg/dL (ref 0–200)
HDL: 61 mg/dL (ref >39)
LDL CALC: 105 mg/dL — AB (ref 0–99)
Triglycerides: 118 mg/dL (ref <150)
VLDL: 24 mg/dL (ref 0–40)

## 2013-10-08 LAB — HEMOGLOBIN A1C
HEMOGLOBIN A1C: 8.4 % — AB (ref <5.7)
Mean Plasma Glucose: 194 mg/dL — ABNORMAL HIGH (ref <117)

## 2013-10-08 LAB — FERRITIN: Ferritin: 27 ng/mL (ref 10–291)

## 2013-10-08 LAB — FOLATE: FOLATE: 13.1 ng/mL

## 2013-10-08 LAB — VITAMIN B12: Vitamin B-12: >2000 pg/mL — ABNORMAL HIGH (ref 211–911)

## 2013-10-08 NOTE — Progress Notes (Signed)
Patient ID: Jamie Ayers, female   DOB: 02/05/1946, 68 y.o.   MRN: [id number]      HPI: Jamie Ayers is a 68 y.o. female who presents to the office for a 6 month cardiology evaluation.  Jamie Ayers has a remote history of a nonischemic cardiomyopathy.  In December 2005 her ejection fraction was 20%. With aggressive medical therapy LV function normalized to approximately 55%. She has a history of severe obstructive sleep apnea and has been utilizing CPAP therapy since 2008. 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 She also has undergone papilloma from a breast duct removal at the time of the hysterectomy.  Over the past 6 months, she has felt well. She is sleeping and using CPAP 100% of the time.  Significant fatigue and not sleeping very well.  She denies chest pain. She denies PND or orthopnea. She denies tachycardia. Laboratory on August 2014 revealed a BUN of 24 creatinine 0.97. She had mild ALT elevation at 41 but otherwise normal AST alkaline phosphatase. She does have microcytic indices. Hemoglobin 12.1 hematocrit 38.3 total cholesterol is 164 triglycerides 92 HDL 54 LDL 92.  reduction of her lovastatin dose from 80 mg to 40 mg. I also recommended a followup echo Doppler study to reassess her cardiomyopathy and diastolic function as well as subsequent followup blood work in 2 months.  Her last echo Doppler study was done 03/20/2013. This showedan ejection fraction at 50-55% with mild concentric left ventricular hypertrophy. She had normal diastolic function. There was evidence for mild aortic insufficiency and mild mitral regurg patient with a centrally directed jet. Her left atrium was mildly dilated. She recently had repeat laboratory done by Jamie Ayers.  She recently underwent followup laboratory on 10/06/2003.  Hemoglobin was 11.6, hematocrit 36.0.  She did have microcytic indices at 73.5.   She denies any history of sickle cell trait or thalassemia.  She is unaware of blood loss.  Glucose was elevated at 147.  She tells me in the past Jamie Ayers had tried metformin but she did not tolerate this.  TSH was 1.054.  He does note a vague episode of chest sensation in the mid chest.  This typically occurs at night.  It is nonexertional.  She complains of fatigue.  The she will be seeing Jamie Ayers in followup next month.  Past Medical History  Diagnosis Date  . Anemia   . Disorder of vocal cord   . Hernia   . CHF (congestive heart failure)   . GERD (gastroesophageal reflux disease)   . Heart murmur   . Thyroid disease   . Upper respiratory infection   . Asthmatic bronchitis   . CHF (congestive heart failure)   . GERD (gastroesophageal reflux disease)   . Anxiety disorder   . OSA on CPAP 03/28/07    sleep study-[facility] 9.42/HR and during REM sleep @37 .50/hr. The average 02 sat duringREM AND NREM was 97.0%  . Benign essential HTN 08/06/11    ECHO-EF>55%  . Hyperlipidemia 03/14/12    Lexiscan myoview-WNL; unchanged from previous study.    Past Surgical History  Procedure Laterality Date  . Vocal cord surg    . Hysteroscopy    . Endometrial biopsy    . Dilation and curettage of uterus    . Nissen fundoplication    . Parathyroid surg    . Hernia repair    .  Abdominal hysterectomy  2013    TAH BSO  . Oophorectomy      BSO  . Breast surgery      Papilloma    Allergies  Allergen Reactions  . Midazolam Hcl     Cardiac arrest    VERSED  . Codeine Hives and Itching  . Sulfonamide Derivatives Hives and Itching    Current Outpatient Prescriptions  Medication Sig Dispense Refill  . aspirin 81 MG tablet Take 162 mg by mouth at bedtime.       Marland Kitchen atorvastatin (LIPITOR) 40 MG tablet Take 1 tablet (40 mg total) by mouth daily.  90 tablet  3  . Calcium Carbonate-Vitamin D (CALCIUM + D PO) Take 1 tablet by mouth daily.       . carvedilol (COREG CR)  40 MG 24 hr capsule Take 1 capsule (40 mg total) by mouth every evening.  90 capsule  3  . Dextromethorphan-Guaifenesin (MUCINEX DM MAXIMUM STRENGTH) 60-1200 MG TB12 Take 1 tablet by mouth daily.      . digoxin (LANOXIN) 0.125 MG tablet Take 1 tablet (125 mcg total) by mouth daily.  90 tablet  3  . esomeprazole (NEXIUM) 40 MG capsule Take 1 capsule (40 mg total) by mouth 2 (two) times daily before a meal.  180 capsule  2  . fluconazole (DIFLUCAN) 150 MG tablet Take 1 tablet (150 mg total) by mouth once. As needed for yeast  12 tablet  0  . furosemide (LASIX) 40 MG tablet Take 1 tablet (40 mg total) by mouth 2 (two) times daily.  180 tablet  3  . ibuprofen (ADVIL,MOTRIN) 800 MG tablet Take 1 tablet (800 mg total) by mouth every 8 (eight) hours as needed for pain.  30 tablet  12  . montelukast (SINGULAIR) 10 MG tablet Take 10 mg by mouth at bedtime.      . Multiple Vitamin (MULTIVITAMIN) tablet Take 1 tablet by mouth daily.        . nitrofurantoin, macrocrystal-monohydrate, (MACROBID) 100 MG capsule Take 1 capsule (100 mg total) by mouth 2 (two) times daily.  14 capsule  0  . NON FORMULARY CPAP THERAPY      . NONFORMULARY OR COMPOUNDED ITEM Boric acid vaginal suppositories 600 mg one per vagina as needed for yeast  30 each  3  . spironolactone (ALDACTONE) 25 MG tablet Take 12.5 mg by mouth once.      . valsartan (DIOVAN) 160 MG tablet Take 1 tablet (160 mg total) by mouth 2 (two) times daily.  180 tablet  3  . vitamin E 400 UNIT capsule Take 400 Units by mouth daily.      Marland Kitchen zolpidem (AMBIEN) 10 MG tablet Take 10 mg by mouth at bedtime as needed. For sleep      . albuterol (PROVENTIL HFA;VENTOLIN HFA) 108 (90 BASE) MCG/ACT inhaler Inhale 2 puffs into the lungs every 4 (four) hours as needed for wheezing.  3.7 g  0   No current facility-administered medications for this visit.    History   Social History  . Marital Status: Married    Spouse Name: N/A    Number of Children: N/A  . Years of  Education: N/A   Occupational History  . Not on file.   Social History Main Topics  . Smoking status: Never Smoker   . Smokeless tobacco: Not on file  . Alcohol Use: No  . Drug Use: No  . Sexual Activity: Yes    Birth Control/ Protection:  Post-menopausal, Surgical     Comment: HYST   Other Topics Concern  . Not on file   Social History Narrative  . No narrative on file    Family History  Problem Relation Age of Onset  . Diabetes Father   . Hypertension Father   . Cancer Father     Prostate cancer  . Arthritis Father   . Gout Father   . Hypertension Mother   . Stroke Mother   . Breast cancer Maternal Grandmother     Age 52's  . Ovarian cancer Paternal Grandmother   . Breast cancer Sister     Age 13  . Kidney disease Sister    Social history is notable in that she is married has 4 children 9 grandchildren. There is no tobacco or alcohol use.   ROS is negative for fevers, chills or night sweats. She denies rash. She denies visual changes.  There is no change in hearing.  She has had issues with hoarseness in the past to verbal cord papillomas. She denies any increasing shortness of breath.  She did have an upper respiratory infection and apparently was treated with a course of steroids. There is no presyncope or syncope She denies chest pain. She denies pleuritic symptoms. There is no wheezing. She denies any nausea vomiting or diarrhea. She is unaware of any blood in her stool or urine. She denies claudication. She denies residual daytime sleepiness. She uses her CPAP every night. She denies abdominal pain. She denies bleeding. She admits to occasional mild swelling around her ankles.   Jamie Ayers is managing her diabetes.  Other comprehensive 14 system review is negative.  PE BP 118/78  Pulse 59  Ht 5\' 5"  (1.651 m)  Wt 247 lb 12.8 oz (112.401 kg)  BMI 41.24 kg/m2  Body Mass index is concordant with morbid obesity. There is no significantly changed from her last office  visit. General: Alert, oriented, no distress.  Skin: normal turgor, no rashes HEENT: Normocephalic, atraumatic. Pupils round and reactive; sclera anicteric;no lid lag.  Nose without nasal septal hypertrophy Mouth/Parynx benign; Mallinpatti scale 3 Neck: Thick neck No JVD, no carotid bruits with normal carolti upstroke Lungs: clear to ausculatation and percussion; no wheezing or rales Chest wall: Nontender to palpation Heart: RRR, s1 s2 normal 2/6 systolic murmur; no diastolic murmur, rubs, thrills or heaves Abdomen: Significant central or possibly soft, nontender; no hepatosplenomehaly, BS+; abdominal aorta nontender and not dilated by palpation. Back: No CVA tenderness Pulses 2+ Extremities: no clubbing cyanosis or edema, Homan's sign negative  Neurologic: grossly nonfocal Psychologic: Normal affect and mood  ECG (independently read by me): Sinus rhythm at 59 beats per minute.  Borderline first-degree block with PR interval of 220 ms.  QTc interval normal.  Prior 04/17/2013 ECG: Normal sinus rhythm at 62 beats per minute. Borderline first degree AV block is no longer present with her PR interval now at 184 ms.  LABS:  BMET    Component Value Date/Time   NA 140 02/05/2013 1021   K 4.2 02/05/2013 1021   CL 100 02/05/2013 1021   CO2 32 02/05/2013 1021   GLUCOSE 144* 02/05/2013 1021   BUN 24* 02/05/2013 1021   CREATININE 0.97 02/05/2013 1021   CREATININE 1.01 02/18/2012 1858   CALCIUM 10.3 02/05/2013 1021   GFRNONAA 57* 02/18/2012 1858   GFRAA 66* 02/18/2012 1858     Hepatic Function Panel     Component Value Date/Time   PROT 6.9 02/05/2013 1021  ALBUMIN 4.5 02/05/2013 1021   AST 20 02/05/2013 1021   ALT 41* 02/05/2013 1021   ALKPHOS 82 02/05/2013 1021   BILITOT 1.0 02/05/2013 1021     CBC    Component Value Date/Time   WBC 8.1 02/05/2013 1014   RBC 5.03 02/05/2013 1014   HGB 12.1 02/05/2013 1014   HCT 38.3 02/05/2013 1014   PLT 247 02/05/2013 1014   MCV 76.1* 02/05/2013 1014    MCH 24.1* 02/05/2013 1014   MCHC 31.6 02/05/2013 1014   RDW 16.6* 02/05/2013 1014   LYMPHSABS 2.1 02/18/2012 1858   MONOABS 0.6 02/18/2012 1858   EOSABS 0.0 02/18/2012 1858   BASOSABS 0.0 02/18/2012 1858     BNP No results found for this basename: probnp    Lipid Panel     Component Value Date/Time   CHOL 164 02/05/2013 1008   TRIG 92 02/05/2013 1008   HDL 54 02/05/2013 1008   CHOLHDL 3.0 02/05/2013 1008   VLDL 18 02/05/2013 1008   LDLCALC 92 02/05/2013 1008     RADIOLOGY: No results found.    ASSESSMENT AND PLAN: Ms. Machnik is a remote history of a nonischemic cardiac myopathy with an ejection fraction of 20%, but subsequently has normalized.  Her last echo Doppler study in September 2014 continued to show normal systolic function.  Her chief complaint today is that of fatigue.  She does note a vague chest sensation which is nonexertional.  A nuclear perfusion study was done in September 2013, which showed normal perfusion without scar or ischemia.  She continues to have microcytic indices and denies any history of thalassemia or sickle cell trait.  I am recommending iron studies be obtained and will check iron, TIBC, and ferritin level.  I also recommended that hemoglobin A1c for probable diabetes.  She does have morbid obesity and weight loss is imperative.  She does note significant fatigue.  She has severe sleep apnea.  I am recommending that we obtain a download of her CPAP unit.  Adjustments may be necessary with her CPAP pressure and other parameters.  I'll see her back in the office in 4 months for followup evaluation.  Troy Sine, MD, [facility]  10/08/2013 9:37 AM

## 2013-10-08 NOTE — Patient Instructions (Addendum)
Your physician recommends that you return for lab work.   Your physician recommends that you schedule a follow-up appointment in: 4 months.  We will order a down load on your CPAP machine.

## 2013-10-10 LAB — VITAMIN D 1,25 DIHYDROXY
Vitamin D 1, 25 (OH)2 Total: 99 pg/mL — ABNORMAL HIGH (ref 18–72)
Vitamin D3 1, 25 (OH)2: 99 pg/mL

## 2013-10-17 ENCOUNTER — Telehealth: Payer: Self-pay | Admitting: *Deleted

## 2013-10-17 NOTE — Telephone Encounter (Signed)
Left message that additional iron studies normal. Nothing to account for her fatigue noted in labs.no need to  Return call unless questions or concerns.Marland Kitchen

## 2013-10-17 NOTE — Telephone Encounter (Signed)
Faxed patient labs for Dr. Kevan Ny to review.

## 2014-01-09 ENCOUNTER — Encounter: Payer: Self-pay | Admitting: Cardiovascular Disease

## 2014-02-04 ENCOUNTER — Other Ambulatory Visit: Payer: Self-pay | Admitting: Orthopedic Surgery

## 2014-02-04 DIAGNOSIS — M25561 Pain in right knee: Secondary | ICD-10-CM

## 2014-02-13 ENCOUNTER — Ambulatory Visit
Admission: RE | Admit: 2014-02-13 | Discharge: 2014-02-13 | Disposition: A | Discharge status: Home / Self Care | Payer: Medicare Other | Source: Ambulatory Visit | Attending: Orthopedic Surgery | Admitting: Orthopedic Surgery

## 2014-02-13 ENCOUNTER — Encounter: Payer: Self-pay | Admitting: Cardiovascular Disease

## 2014-02-13 ENCOUNTER — Ambulatory Visit (INDEPENDENT_AMBULATORY_CARE_PROVIDER_SITE_OTHER): Payer: Medicare Other | Admitting: Cardiovascular Disease

## 2014-02-13 VITALS — BP 118/72 | HR 67 | Ht 65.0 in | Wt 243.6 lb

## 2014-02-13 DIAGNOSIS — Z9989 Dependence on other enabling machines and devices: Secondary | ICD-10-CM

## 2014-02-13 DIAGNOSIS — G4733 Obstructive sleep apnea (adult) (pediatric): Secondary | ICD-10-CM

## 2014-02-13 DIAGNOSIS — E782 Mixed hyperlipidemia: Secondary | ICD-10-CM

## 2014-02-13 DIAGNOSIS — E8881 Metabolic syndrome: Secondary | ICD-10-CM

## 2014-02-13 DIAGNOSIS — M25561 Pain in right knee: Secondary | ICD-10-CM

## 2014-02-13 DIAGNOSIS — I10 Essential (primary) hypertension: Secondary | ICD-10-CM

## 2014-02-13 DIAGNOSIS — E785 Hyperlipidemia, unspecified: Secondary | ICD-10-CM

## 2014-02-13 DIAGNOSIS — J381 Polyp of vocal cord and larynx: Secondary | ICD-10-CM

## 2014-02-13 DIAGNOSIS — G4730 Sleep apnea, unspecified: Secondary | ICD-10-CM

## 2014-02-13 DIAGNOSIS — I428 Other cardiomyopathies: Secondary | ICD-10-CM

## 2014-02-13 DIAGNOSIS — K219 Gastro-esophageal reflux disease without esophagitis: Secondary | ICD-10-CM

## 2014-02-13 DIAGNOSIS — IMO0001 Reserved for inherently not codable concepts without codable children: Secondary | ICD-10-CM

## 2014-02-13 NOTE — Progress Notes (Signed)
Patient ID: Jamie Ayers, female   DOB: 03-21-1946, 68 y.o.   MRN: [id number]      HPI: Jamie Ayers is a 68 y.o. female who presents to the office for a 4 month cardiology evaluation.  Jamie Ayers has a remote history of a nonischemic cardiomyopathy.  In December 2005 her ejection fraction was 20%. With aggressive medical therapy LV function normalized to approximately 55%. She has a history of severe obstructive sleep apnea and has been utilizing CPAP therapy since 2008. 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 She also has undergone papilloma from a breast duct removal at the time of the hysterectomy.  Over the past 6 months, she has felt well. She is sleeping and using CPAP 100% of the time.  Significant fatigue and not sleeping very well.  She denies chest pain. She denies PND or orthopnea. She denies tachycardia. Laboratory on August 2014 revealed a BUN of 24 creatinine 0.97. She had mild ALT elevation at 41 but otherwise normal AST alkaline phosphatase. She does have microcytic indices. Hemoglobin 12.1 hematocrit 38.3 total cholesterol is 164 triglycerides 92 HDL 54 LDL 92.  reduction of her lovastatin dose from 80 mg to 40 mg. I also recommended a followup echo Doppler study to reassess her cardiomyopathy and diastolic function as well as subsequent followup blood work in 2 months.  Her last echo Doppler study was done 03/20/2013. This showedan ejection fraction at 50-55% with mild concentric left ventricular hypertrophy. She had normal diastolic function. There was evidence for mild aortic insufficiency and mild mitral regurg patient with a centrally directed jet. Her left atrium was mildly dilated. She recently had repeat laboratory done by Dr. Kevan Ny.  Laboratory on 10/05/2013 revealed hemoglobin11.6/ hematocrit 36.0.  She did have microcytic indices at 73.5.  She denies any history of  sickle cell trait or thalassemia.  She is unaware of blood loss.  Glucose was elevated at 147.  She tells me in the past Dr. Marlou Sa had tried metformin but she did not tolerate this.  TSH was 1.054.  He does note a vague episode of chest sensation in the mid chest.  This typically occurs at night.  It is nonexertional.  She complains of fatigue.  She also has noted some right leg discomfort and she tells me she is scheduled to undergo an MRI for Dr. Berenice Primas.  She denies PND, orthopnea.  She is unaware of palpitations.  She denies significant edema.  She denies presyncope or syncope.  She has been recently doing water aerobics 3-4 times per week.  She admits to a 4 pound weight loss.   Past Medical History  Diagnosis Date  . Anemia   . Disorder of vocal cord   . Hernia   . CHF (congestive heart failure)   . GERD (gastroesophageal reflux disease)   . Heart murmur   . Thyroid disease   . Upper respiratory infection   . Asthmatic bronchitis   . CHF (congestive heart failure)   . GERD (gastroesophageal reflux disease)   . Anxiety disorder   . OSA on CPAP 03/28/07    sleep study-[facility] 9.42/HR and during REM sleep @37 .50/hr. The average 02 sat duringREM AND NREM was 97.0%  . Benign essential HTN 08/06/11    ECHO-EF>55%  . Hyperlipidemia 03/14/12    Lexiscan myoview-WNL; unchanged from previous study.    Past Surgical History  Procedure Laterality Date  . Vocal cord surg    . Hysteroscopy    . Endometrial biopsy    . Dilation and curettage of uterus    . Nissen fundoplication    . Parathyroid surg    . Hernia repair    . Abdominal hysterectomy  2013    TAH BSO  . Oophorectomy      BSO  . Breast surgery      Papilloma    Allergies  Allergen Reactions  . Midazolam Hcl     Cardiac arrest    VERSED  . Codeine Hives and Itching  . Sulfonamide Derivatives Hives and Itching    Current Outpatient Prescriptions  Medication Sig Dispense Refill  .  albuterol (PROVENTIL HFA;VENTOLIN HFA) 108 (90 BASE) MCG/ACT inhaler Inhale 2 puffs into the lungs every 4 (four) hours as needed for wheezing.  3.7 g  0  . aspirin 81 MG tablet Take 162 mg by mouth at bedtime.       Marland Kitchen atorvastatin (LIPITOR) 40 MG tablet Take 1 tablet (40 mg total) by mouth daily.  90 tablet  3  . Calcium Carbonate-Vitamin D (CALCIUM + D PO) Take 1 tablet by mouth daily.       . carvedilol (COREG CR) 40 MG 24 hr capsule Take 1 capsule (40 mg total) by mouth every evening.  90 capsule  3  . Dextromethorphan-Guaifenesin (MUCINEX DM MAXIMUM STRENGTH) 60-1200 MG TB12 Take 1 tablet by mouth daily.      . digoxin (LANOXIN) 0.125 MG tablet Take 1 tablet (125 mcg total) by mouth daily.  90 tablet  3  . esomeprazole (NEXIUM) 40 MG capsule Take 1 capsule (40 mg total) by mouth 2 (two) times daily before a meal.  180 capsule  2  . fluconazole (DIFLUCAN) 150 MG tablet Take 1 tablet (150 mg total) by mouth once. As needed for yeast  12 tablet  0  . furosemide (LASIX) 40 MG tablet Take 1 tablet (40 mg total) by mouth 2 (two) times daily.  180 tablet  3  . ibuprofen (ADVIL,MOTRIN) 800 MG tablet Take 1 tablet (800 mg total) by mouth every 8 (eight) hours as needed for pain.  30 tablet  12  . montelukast (SINGULAIR) 10 MG tablet Take 10 mg by mouth at bedtime.      . Multiple Vitamin (MULTIVITAMIN) tablet Take 1 tablet by mouth daily.        . nitrofurantoin, macrocrystal-monohydrate, (MACROBID) 100 MG capsule Take 1 capsule (100 mg total) by mouth 2 (two) times daily.  14 capsule  0  . NON FORMULARY CPAP THERAPY      . NONFORMULARY OR COMPOUNDED ITEM Boric acid vaginal suppositories 600 mg one per vagina as needed for yeast  30 each  3  . spironolactone (ALDACTONE) 25 MG tablet Take 12.5 mg by mouth once.      . valsartan (DIOVAN) 160 MG tablet Take 1 tablet (160 mg total) by mouth 2 (two) times daily.  180 tablet  3  . vitamin E 400 UNIT capsule Take 400 Units by mouth daily.      Marland Kitchen zolpidem  (AMBIEN) 10 MG tablet Take 10 mg by mouth at bedtime as needed. For sleep       No current facility-administered medications for this visit.    History   Social History  . Marital Status: Married    Spouse Name: N/A    Number of Children: N/A  . Years of Education: N/A  Occupational History  . Not on file.   Social History Main Topics  . Smoking status: Never Smoker   . Smokeless tobacco: Not on file  . Alcohol Use: No  . Drug Use: No  . Sexual Activity: Yes    Birth Control/ Protection: Post-menopausal, Surgical     Comment: HYST   Other Topics Concern  . Not on file   Social History Narrative  . No narrative on file    Family History  Problem Relation Age of Onset  . Diabetes Father   . Hypertension Father   . Cancer Father     Prostate cancer  . Arthritis Father   . Gout Father   . Hypertension Mother   . Stroke Mother   . Breast cancer Maternal Grandmother     Age 20's  . Ovarian cancer Paternal Grandmother   . Breast cancer Sister     Age 15  . Kidney disease Sister    Social history is notable in that she is married has 4 children 9 grandchildren. There is no tobacco or alcohol use.  ROS General: Negative; No fevers, chills, or night sweats;  HEENT: Occasional hoarseness.  In the past.  She has had difficulty with vocal cord papillomas. No changes in vision or hearing, sinus congestion, difficulty swallowing Pulmonary: Negative; No cough, wheezing, shortness of breath, hemoptysis Cardiovascular: Negative; No chest pain, presyncope, syncope, palpitations GI: Negative; No nausea, vomiting, diarrhea, or abdominal pain GU: Negative; No dysuria, hematuria, or difficulty voiding Musculoskeletal: Positive for mild myalgias; no joint pain, or weakness Hematologic/Oncology: Negative; no easy bruising, bleeding Endocrine: Positive for diabetes mellitus.  No cold or heat intolerance. Neuro: Negative; no changes in balance, headaches Skin: Negative; No rashes  or skin lesions Psychiatric: Negative; No behavioral problems, depression Sleep: Positive for sleep apnea, for which she uses CPAP every night.  No residual snoring, daytime sleepiness, hypersomnolence, bruxism, restless legs, hypnogognic hallucinations, no cataplexy Other comprehensive 14 point system review is negative.   PE BP 118/72  Pulse 67  Ht 5\' 5"  (1.651 m)  Wt 243 lb 9.6 oz (110.496 kg)  BMI 40.54 kg/m2  Body Mass index is concordant with morbid obesity. There is a 4 pound weight loss from her last office visit. General: Alert, oriented, no distress.  Skin: normal turgor, no rashes HEENT: Normocephalic, atraumatic. Pupils round and reactive; sclera anicteric;no lid lag.  Nose without nasal septal hypertrophy Mouth/Parynx benign; Mallinpatti scale 3 Neck: Thick neck No JVD, no carotid bruits with normal carolti upstroke Lungs: clear to ausculatation and percussion; no wheezing or rales Chest wall: Nontender to palpation Heart: RRR, s1 s2 normal 2/6 systolic murmur; no diastolic murmur, rubs, thrills or heaves Abdomen: Significant central or possibly soft, nontender; no hepatosplenomehaly, BS+; abdominal aorta nontender and not dilated by palpation. Back: No CVA tenderness Pulses 2+ Extremities: no clubbing cyanosis or edema, Homan's sign negative  Neurologic: grossly nonfocal Psychologic: Normal affect and mood  ECG (independently read by me): Normal sinus rhythm at 67 beats per minute.  Moderate voltage criteria for LVH.  No significant ST changes.  Borderline first degree AV block with a PR interval at 208 ms.  10/08/2013 ECG (independently read by me): Sinus rhythm at 59 beats per minute.  Borderline first-degree block with PR interval of 220 ms.  QTc interval normal.  Prior 04/17/2013 ECG: Normal sinus rhythm at 62 beats per minute. Borderline first degree AV block is no longer present with her PR interval now at 184 ms.  LABS:  BMET    Component Value Date/Time    NA 141 10/05/2013 1313   K 4.0 10/05/2013 1313   CL 102 10/05/2013 1313   CO2 29 10/05/2013 1313   GLUCOSE 147* 10/05/2013 1313   BUN 17 10/05/2013 1313   CREATININE 0.95 10/05/2013 1313   CREATININE 1.01 02/18/2012 1858   CALCIUM 9.6 10/05/2013 1313   GFRNONAA 57* 02/18/2012 1858   GFRAA 66* 02/18/2012 1858     Hepatic Function Panel     Component Value Date/Time   PROT 6.9 10/05/2013 1313   ALBUMIN 4.1 10/05/2013 1313   AST 14 10/05/2013 1313   ALT 20 10/05/2013 1313   ALKPHOS 86 10/05/2013 1313   BILITOT 0.8 10/05/2013 1313     CBC    Component Value Date/Time   WBC 7.4 10/05/2013 1313   RBC 4.90 10/05/2013 1313   HGB 11.6* 10/05/2013 1313   HCT 36.0 10/05/2013 1313   PLT 241 10/05/2013 1313   MCV 73.5* 10/05/2013 1313   MCH 23.7* 10/05/2013 1313   MCHC 32.2 10/05/2013 1313   RDW 16.1* 10/05/2013 1313   LYMPHSABS 2.1 02/18/2012 1858   MONOABS 0.6 02/18/2012 1858   EOSABS 0.0 02/18/2012 1858   BASOSABS 0.0 02/18/2012 1858     BNP No results found for this basename: probnp    Lipid Panel     Component Value Date/Time   CHOL 190 10/08/2013 0953   TRIG 118 10/08/2013 0953   HDL 61 10/08/2013 0953   CHOLHDL 3.1 10/08/2013 0953   VLDL 24 10/08/2013 0953   LDLCALC 105* 10/08/2013 0953     RADIOLOGY: No results found.    ASSESSMENT AND PLAN: Ms. Sanjose is a remote history of a nonischemic cardiomyopathy with an ejection fraction of 20%, but subsequently has normalized.  Her last echo Doppler study in September 2014 continued to show normal systolic function.  A nuclear perfusion study  in September 2013 which showed normal perfusion without scar or ischemia.  She has severe sleep apnea and is utilizing her CPAP therapy.  She is now exercising more early in his performing water aerobics 3-4 times per week.  Class.  I commended her on her initiation of mild weight loss.  I recommended more aggressive weight loss.  He undertaken.  I am recommending laboratory be obtained consisting of a  compressive metabolic panel, CBC, TSH, lipid panel, and hemoglobin A1c.  She also complains of some mild myalgias and I will  check a CPK since she is on atorvastatin therapy.  I will see her in 6 months for pathologic evaluation.  Troy Sine, MD, [facility]  02/13/2014 9:18 AM

## 2014-02-13 NOTE — Patient Instructions (Signed)
Your physician recommends that you return for lab work FASTING. Your physician recommends that you schedule a follow-up appointment in 6 MONTHS. No changes were made today in your therapy.

## 2014-03-06 ENCOUNTER — Telehealth: Payer: Self-pay | Admitting: Cardiovascular Disease

## 2014-03-06 MED ORDER — ESOMEPRAZOLE MAGNESIUM 40 MG PO CPDR: 40.0000 mg | DELAYED_RELEASE_CAPSULE | Freq: Two times a day (BID) | ORAL | Status: DC

## 2014-03-06 MED ORDER — DIGOXIN 125 MCG PO TABS: 125.0000 ug | ORAL_TABLET | Freq: Every day | ORAL | Status: DC

## 2014-03-06 MED ORDER — FUROSEMIDE 40 MG PO TABS
40.0000 mg | ORAL_TABLET | Freq: Two times a day (BID) | ORAL | Status: DC
Start: 2014-03-06 — End: 2015-04-14

## 2014-03-06 MED ORDER — ATORVASTATIN CALCIUM 40 MG PO TABS: 40.0000 mg | ORAL_TABLET | Freq: Every day | ORAL | Status: DC

## 2014-03-06 MED ORDER — SPIRONOLACTONE 25 MG PO TABS
12.5000 mg | ORAL_TABLET | Freq: Every day | ORAL | Status: DC
Start: 2014-03-06 — End: 2015-02-18

## 2014-03-06 MED ORDER — VALSARTAN 160 MG PO TABS: 160.0000 mg | ORAL_TABLET | Freq: Two times a day (BID) | ORAL | Status: DC

## 2014-03-06 MED ORDER — CARVEDILOL PHOSPHATE ER 40 MG PO CP24: 40.0000 mg | ORAL_CAPSULE | Freq: Every evening | ORAL | Status: DC

## 2014-03-06 NOTE — Addendum Note (Signed)
Addended by: Fidel Levy on: 03/06/2014 02:06 PM   Modules accepted: Orders

## 2014-03-06 NOTE — Telephone Encounter (Signed)
Rx was sent to pharmacy electronically. Patient notified.

## 2014-03-06 NOTE — Telephone Encounter (Signed)
Pt need all of her medicine called or sent in to [facility].Please take care of this asap please.

## 2014-03-25 ENCOUNTER — Telehealth: Payer: Self-pay | Admitting: Cardiovascular Disease

## 2014-03-25 MED ORDER — DIGOXIN 125 MCG PO TABS: 125.0000 ug | ORAL_TABLET | Freq: Every day | ORAL | Status: DC

## 2014-03-25 NOTE — Telephone Encounter (Signed)
Left message for pt refills sent into both pharm

## 2014-03-25 NOTE — Telephone Encounter (Signed)
Please call her Digoxin in today please. Please call it to [facility] with a 90 days supply with 6 refills. She also need it called to her local pharmacy,so she can have some until her mail order comes in. Please call to [facility]- [phone].

## 2014-04-22 ENCOUNTER — Encounter: Payer: Self-pay | Admitting: Cardiovascular Disease

## 2014-04-30 ENCOUNTER — Other Ambulatory Visit: Payer: Self-pay | Admitting: Internal Medicine

## 2014-04-30 ENCOUNTER — Ambulatory Visit
Admission: RE | Admit: 2014-04-30 | Discharge: 2014-04-30 | Disposition: A | Discharge status: Home / Self Care | Payer: Medicare Other | Source: Ambulatory Visit | Attending: Internal Medicine | Admitting: Internal Medicine

## 2014-04-30 DIAGNOSIS — R52 Pain, unspecified: Secondary | ICD-10-CM

## 2014-06-05 ENCOUNTER — Ambulatory Visit (INDEPENDENT_AMBULATORY_CARE_PROVIDER_SITE_OTHER): Payer: Medicare Other | Admitting: Gynecology

## 2014-06-05 ENCOUNTER — Encounter: Payer: Self-pay | Admitting: Gynecology

## 2014-06-05 VITALS — BP 124/74 | Ht 63.0 in | Wt 236.0 lb

## 2014-06-05 DIAGNOSIS — N3001 Acute cystitis with hematuria: Secondary | ICD-10-CM

## 2014-06-05 DIAGNOSIS — N9489 Other specified conditions associated with female genital organs and menstrual cycle: Secondary | ICD-10-CM

## 2014-06-05 DIAGNOSIS — N952 Postmenopausal atrophic vaginitis: Secondary | ICD-10-CM

## 2014-06-05 DIAGNOSIS — N898 Other specified noninflammatory disorders of vagina: Secondary | ICD-10-CM

## 2014-06-05 LAB — URINALYSIS W MICROSCOPIC + REFLEX CULTURE
BILIRUBIN URINE: NEGATIVE
Casts: NONE SEEN
Crystals: NONE SEEN
GLUCOSE, UA: NEGATIVE mg/dL
Ketones, ur: NEGATIVE mg/dL
Nitrite: NEGATIVE
PH: 6.5 (ref 5.0–8.0)
PROTEIN: NEGATIVE mg/dL
Specific Gravity, Urine: 1.015 (ref 1.005–1.030)
Urobilinogen, UA: 0.2 mg/dL (ref 0.0–1.0)

## 2014-06-05 LAB — WET PREP FOR TRICH, YEAST, CLUE
Clue Cells Wet Prep HPF POC: NONE SEEN
TRICH WET PREP: NONE SEEN
Yeast Wet Prep HPF POC: NONE SEEN

## 2014-06-05 MED ORDER — NITROFURANTOIN MONOHYD MACRO 100 MG PO CAPS: 100.0000 mg | ORAL_CAPSULE | Freq: Two times a day (BID) | ORAL | Status: DC

## 2014-06-05 NOTE — Patient Instructions (Signed)
Take the antibiotic twice daily for 7 days  You may obtain a copy of any labs that were done today by logging onto MyChart as outlined in the instructions provided with your AVS (after visit summary). The office will not call with normal lab results but certainly if there are any significant abnormalities then we will contact you.   Health Maintenance, Female A healthy lifestyle and preventative care can promote health and wellness.  Maintain regular health, dental, and eye exams.  Eat a healthy diet. Foods like vegetables, fruits, whole grains, low-fat dairy products, and lean protein foods contain the nutrients you need without too many calories. Decrease your intake of foods high in solid fats, added sugars, and salt. Get information about a proper diet from your caregiver, if necessary.  Regular physical exercise is one of the most important things you can do for your health. Most adults should get at least 150 minutes of moderate-intensity exercise (any activity that increases your heart rate and causes you to sweat) each week. In addition, most adults need muscle-strengthening exercises on 2 or more days a week.   Maintain a healthy weight. The body mass index (BMI) is a screening tool to identify possible weight problems. It provides an estimate of body fat based on height and weight. Your caregiver can help determine your BMI, and can help you achieve or maintain a healthy weight. For adults 20 years and older:  A BMI below 18.5 is considered underweight.  A BMI of 18.5 to 24.9 is normal.  A BMI of 25 to 29.9 is considered overweight.  A BMI of 30 and above is considered obese.  Maintain normal blood lipids and cholesterol by exercising and minimizing your intake of saturated fat. Eat a balanced diet with plenty of fruits and vegetables. Blood tests for lipids and cholesterol should begin at age 60 and be repeated every 5 years. If your lipid or cholesterol levels are high, you are  over 50, or you are a high risk for heart disease, you may need your cholesterol levels checked more frequently.Ongoing high lipid and cholesterol levels should be treated with medicines if diet and exercise are not effective.  If you smoke, find out from your caregiver how to quit. If you do not use tobacco, do not start.  Lung cancer screening is recommended for adults aged 25 80 years who are at high risk for developing lung cancer because of a history of smoking. Yearly low-dose computed tomography (CT) is recommended for people who have at least a 30-pack-year history of smoking and are a current smoker or have quit within the past 15 years. A pack year of smoking is smoking an average of 1 pack of cigarettes a day for 1 year (for example: 1 pack a day for 30 years or 2 packs a day for 15 years). Yearly screening should continue until the smoker has stopped smoking for at least 15 years. Yearly screening should also be stopped for people who develop a health problem that would prevent them from having lung cancer treatment.  If you are pregnant, do not drink alcohol. If you are breastfeeding, be very cautious about drinking alcohol. If you are not pregnant and choose to drink alcohol, do not exceed 1 drink per day. One drink is considered to be 12 ounces (355 mL) of beer, 5 ounces (148 mL) of wine, or 1.5 ounces (44 mL) of liquor.  Avoid use of street drugs. Do not share needles with anyone. Ask for  help if you need support or instructions about stopping the use of drugs.  High blood pressure causes heart disease and increases the risk of stroke. Blood pressure should be checked at least every 1 to 2 years. Ongoing high blood pressure should be treated with medicines, if weight loss and exercise are not effective.  If you are 55 to 68 years old, ask your caregiver if you should take aspirin to prevent strokes.  Diabetes screening involves taking a blood sample to check your fasting blood sugar  level. This should be done once every 3 years, after age 45, if you are within normal weight and without risk factors for diabetes. Testing should be considered at a younger age or be carried out more frequently if you are overweight and have at least 1 risk factor for diabetes.  Breast cancer screening is essential preventative care for women. You should practice "breast self-awareness." This means understanding the normal appearance and feel of your breasts and may include breast self-examination. Any changes detected, no matter how small, should be reported to a caregiver. Women in their 20s and 30s should have a clinical breast exam (CBE) by a caregiver as part of a regular health exam every 1 to 3 years. After age 40, women should have a CBE every year. Starting at age 40, women should consider having a mammogram (breast X-ray) every year. Women who have a family history of breast cancer should talk to their caregiver about genetic screening. Women at a high risk of breast cancer should talk to their caregiver about having an MRI and a mammogram every year.  Breast cancer gene (BRCA)-related cancer risk assessment is recommended for women who have family members with BRCA-related cancers. BRCA-related cancers include breast, ovarian, tubal, and peritoneal cancers. Having family members with these cancers may be associated with an increased risk for harmful changes (mutations) in the breast cancer genes BRCA1 and BRCA2. Results of the assessment will determine the need for genetic counseling and BRCA1 and BRCA2 testing.  The Pap test is a screening test for cervical cancer. Women should have a Pap test starting at age 21. Between ages 21 and 29, Pap tests should be repeated every 2 years. Beginning at age 30, you should have a Pap test every 3 years as long as the past 3 Pap tests have been normal. If you had a hysterectomy for a problem that was not cancer or a condition that could lead to cancer, then  you no longer need Pap tests. If you are between ages 65 and 70, and you have had normal Pap tests going back 10 years, you no longer need Pap tests. If you have had past treatment for cervical cancer or a condition that could lead to cancer, you need Pap tests and screening for cancer for at least 20 years after your treatment. If Pap tests have been discontinued, risk factors (such as a new sexual partner) need to be reassessed to determine if screening should be resumed. Some women have medical problems that increase the chance of getting cervical cancer. In these cases, your caregiver may recommend more frequent screening and Pap tests.  The human papillomavirus (HPV) test is an additional test that may be used for cervical cancer screening. The HPV test looks for the virus that can cause the cell changes on the cervix. The cells collected during the Pap test can be tested for HPV. The HPV test could be used to screen women aged 30 years and older,   and should be used in women of any age who have unclear Pap test results. After the age of 30, women should have HPV testing at the same frequency as a Pap test.  Colorectal cancer can be detected and often prevented. Most routine colorectal cancer screening begins at the age of 50 and continues through age 75. However, your caregiver may recommend screening at an earlier age if you have risk factors for colon cancer. On a yearly basis, your caregiver may provide home test kits to check for hidden blood in the stool. Use of a small camera at the end of a tube, to directly examine the colon (sigmoidoscopy or colonoscopy), can detect the earliest forms of colorectal cancer. Talk to your caregiver about this at age 50, when routine screening begins. Direct examination of the colon should be repeated every 5 to 10 years through age 75, unless early forms of pre-cancerous polyps or small growths are found.  Hepatitis C blood testing is recommended for all people born  from 1945 through 1965 and any individual with known risks for hepatitis C.  Practice safe sex. Use condoms and avoid high-risk sexual practices to reduce the spread of sexually transmitted infections (STIs). Sexually active women aged 25 and younger should be checked for Chlamydia, which is a common sexually transmitted infection. Older women with new or multiple partners should also be tested for Chlamydia. Testing for other STIs is recommended if you are sexually active and at increased risk.  Osteoporosis is a disease in which the bones lose minerals and strength with aging. This can result in serious bone fractures. The risk of osteoporosis can be identified using a bone density scan. Women ages 65 and over and women at risk for fractures or osteoporosis should discuss screening with their caregivers. Ask your caregiver whether you should be taking a calcium supplement or vitamin D to reduce the rate of osteoporosis.  Menopause can be associated with physical symptoms and risks. Hormone replacement therapy is available to decrease symptoms and risks. You should talk to your caregiver about whether hormone replacement therapy is right for you.  Use sunscreen. Apply sunscreen liberally and repeatedly throughout the day. You should seek shade when your shadow is shorter than you. Protect yourself by wearing long sleeves, pants, a wide-brimmed hat, and sunglasses year round, whenever you are outdoors.  Notify your caregiver of new moles or changes in moles, especially if there is a change in shape or color. Also notify your caregiver if a mole is larger than the size of a pencil eraser.  Stay current with your immunizations. Document Released: 12/21/2010 Document Revised: 10/02/2012 Document Reviewed: 12/21/2010 [employer] Patient Information 2014 [employer].   

## 2014-06-05 NOTE — Addendum Note (Signed)
Addended by: Nelva Nay on: 06/05/2014 12:14 PM   Modules accepted: Orders

## 2014-06-05 NOTE — Progress Notes (Signed)
Jamie Ayers 02-25-1946 [id number]        68 y.o.  [id number] 4 follow up exam. Several issues noted below.  Past medical history,surgical history, problem list, medications, allergies, family history and social history were all reviewed and documented as reviewed in the EPIC chart.  ROS:  12 system ROS performed with pertinent positives and negatives included in the history, assessment and plan.   Additional significant findings :  none   Exam: [name] Counsellor Vitals:   06/05/14 1045  BP: 124/74  Height: 5\' 3"  (1.6 m)  Weight: 236 lb (107.049 kg)   General appearance:  Normal affect, orientation and appearance. Skin: Grossly normal HEENT: Without gross lesions.  No cervical or supraclavicular adenopathy. Thyroid normal.  Lungs:  Clear without wheezing, rales or rhonchi Cardiac: RR, without RMG Abdominal:  Soft, nontender, without masses, guarding, rebound, organomegaly or hernia Breasts:  Examined lying and sitting without masses, retractions, discharge or axillary adenopathy. Pelvic:  Ext/BUS/vagina with generalized atrophic changes.  Adnexa  Without masses or tenderness    Anus and perineum  Normal   Rectovaginal  Normal sphincter tone without palpated masses or tenderness.    Assessment/Plan:  68 y.o. [id number] female for follow up exam.   1. UTI. Patient notes over the last week or 2 suprapubic pressure symptoms and vaginal odor. No significant discharge or itching. No frequency urgency or back pain fever chills nausea vomiting diarrhea constipation. Exam is unremarkable. Urine is consistent with UTI. Wet prep is negative.  Will cover with Macrobid 100 mg twice a day 7 days. Follow up if symptoms persist, worsen or recur. 2. Postmenopausal/atrophic genital changes. Without significant hot flushes, night sweats, vaginal dryness or dyspareunia.  Status post TAH/BSO for leiomyoma. Continue to monitor. 3. Pap smear 2012. No Pap smear done today. No history of significant  abnormal Pap smears. Over the age of 50. Status post hysterectomy for benign indications. Reviewed current screening guidelines and we both agree to stop screening. 4. DEXA 2015 normal. Repeated 5 year interval. Increased calcium vitamin D reviewed. 5. Colonoscopy 2013. Repeat at their recommended interval. 6. Mammography 08/2013. Continue with annual mammography. SBE monthly reviewed. 7. Health maintenance. No routine blood work done as she reports this done at her primary physician's office. Follow up in one year, sooner as needed.     Anastasio Auerbach MD, 11:15 AM 06/05/2014

## 2014-06-07 LAB — URINE CULTURE
COLONY COUNT: NO GROWTH
ORGANISM ID, BACTERIA: NO GROWTH

## 2014-06-10 ENCOUNTER — Telehealth: Payer: Self-pay | Admitting: *Deleted

## 2014-06-10 NOTE — Telephone Encounter (Signed)
I would start with Diflucan 150 mg 1 dose and see if this does not get rid of the discharge. The antibiotics for the urinary tract infection may have given her a yeast infection.

## 2014-06-10 NOTE — Telephone Encounter (Signed)
Pt called to follow up from OV still taking Macrobid 100 mg #14 c/o vaginal itching and vaginal odor, with discharge pt said unsure of color of discharge. Pt asked if OV needed again? Or if Rx could be sent? Please advise

## 2014-06-11 MED ORDER — FLUCONAZOLE 150 MG PO TABS: ORAL_TABLET | ORAL | Status: DC

## 2014-06-11 NOTE — Telephone Encounter (Signed)
Okay to refill? 

## 2014-06-11 NOTE — Telephone Encounter (Signed)
Rx sent 

## 2014-06-11 NOTE — Telephone Encounter (Signed)
Dr.Fontaine patient has a diflucan tablet on hand for yeast PRN, you gave her a Rx for #12 last annual. "Diflucan 150 mg #12 provided to have available to use one pill as needed" on 05/20/13. Pt forgot to ask for refill for this at annual this year. Okay to refill #12 diflucan tablets?

## 2014-06-18 ENCOUNTER — Telehealth: Payer: Self-pay | Admitting: *Deleted

## 2014-06-18 MED ORDER — CIPROFLOXACIN HCL 250 MG PO TABS: 250.0000 mg | ORAL_TABLET | Freq: Two times a day (BID) | ORAL | Status: DC

## 2014-06-18 NOTE — Telephone Encounter (Signed)
Pt informed with the below note, rx sent. 

## 2014-06-18 NOTE — Telephone Encounter (Signed)
Ciprofloxacin 250 mg twice a day 3 days. Will need to be seen if her symptoms persist.

## 2014-06-18 NOTE — Telephone Encounter (Signed)
Pt has completed macrobid 100 mg x 7 day that was prescribed on OV 06/05/14. Pt said symptoms have returned pelvic pressure no frequency urgency or back pain fever chills nausea vomiting diarrhea constipation. She is out of town now asked if you would refill Rx for her? Please advise

## 2014-08-09 ENCOUNTER — Telehealth: Payer: Self-pay | Admitting: *Deleted

## 2014-08-09 ENCOUNTER — Other Ambulatory Visit: Payer: Self-pay | Admitting: Gynecology

## 2014-08-09 DIAGNOSIS — N644 Mastodynia: Secondary | ICD-10-CM

## 2014-08-09 NOTE — Telephone Encounter (Signed)
Pt called c/o right breast discomfort, requesting order for diagnotic mammogram. I explained to pt that OV with provider beast. Pt transferred to front desk to schedule.

## 2014-08-20 ENCOUNTER — Ambulatory Visit (INDEPENDENT_AMBULATORY_CARE_PROVIDER_SITE_OTHER): Payer: Medicare Other | Admitting: Gynecology

## 2014-08-20 ENCOUNTER — Encounter: Payer: Self-pay | Admitting: Gynecology

## 2014-08-20 VITALS — BP 130/74

## 2014-08-20 DIAGNOSIS — N9489 Other specified conditions associated with female genital organs and menstrual cycle: Secondary | ICD-10-CM

## 2014-08-20 DIAGNOSIS — N644 Mastodynia: Secondary | ICD-10-CM | POA: Diagnosis not present

## 2014-08-20 DIAGNOSIS — N898 Other specified noninflammatory disorders of vagina: Secondary | ICD-10-CM

## 2014-08-20 LAB — WET PREP FOR TRICH, YEAST, CLUE
CLUE CELLS WET PREP: NONE SEEN
Trich, Wet Prep: NONE SEEN
YEAST WET PREP: NONE SEEN

## 2014-08-20 LAB — URINALYSIS W MICROSCOPIC + REFLEX CULTURE
BILIRUBIN URINE: NEGATIVE
CRYSTALS: NONE SEEN
Casts: NONE SEEN
Glucose, UA: NEGATIVE mg/dL
Hgb urine dipstick: NEGATIVE
Ketones, ur: NEGATIVE mg/dL
Nitrite: NEGATIVE
Protein, ur: NEGATIVE mg/dL
RBC / HPF: NONE SEEN RBC/hpf (ref <3)
UROBILINOGEN UA: 0.2 mg/dL (ref 0.0–1.0)
pH: 5.5 (ref 5.0–8.0)

## 2014-08-20 MED ORDER — METRONIDAZOLE 500 MG PO TABS
500.0000 mg | ORAL_TABLET | Freq: Two times a day (BID) | ORAL | Status: DC
Start: 2014-08-20 — End: 2015-05-28

## 2014-08-20 NOTE — Patient Instructions (Signed)
Office will call you to arrange the mammogram and ultrasound of the breast. Take Flagyl medication twice daily for 7 days. Avoid alcohol while taking.

## 2014-08-20 NOTE — Progress Notes (Signed)
Jamie Ayers 1946/01/05 [id number]        68 y.o.  [id number] Presents with 2 issues:  1. Vaginal odor with slight vaginal discharge on and off over the last several months. No itching dysuria or frequency urgency. 2. Right breast tail of Spence discomfort. Comes and goes. Present for several years. No patient appreciated masses, discharge or other abnormalities. Patient worried because her sister developed breast cancer.  Past medical history,surgical history, problem list, medications, allergies, family history and social history were all reviewed and documented in the EPIC chart.  Directed ROS with pertinent positives and negatives documented in the history of present illness/assessment and plan.  Exam: [name] assistant Filed Vitals:   08/20/14 1452  BP: 130/74   General appearance:  Normal Both breasts examined lying and sitting with both nipples slightly dimpled as always previously. No masses, retractions, discharge or adenopathy. Patients pointing to the right tail of Spence as to where's her discomfort is but there is no palpable or visual abnormalities. Pelvic external BUS vagina with atrophic changes. Scant discharge noted. Bimanual without masses or tenderness  Assessment/Plan:  69 y.o. [id number] with:  1. Scant vaginal discharge with odor. Wet prep is negative. Urinalysis shows 3-6 WBC with few bacteria. Will follow up with urine culture and treat accordingly. Will cover for low-level bacterial vaginosis given history with Flagyl 500 mg twice a day 7 days. Alcohol avoidance reviewed.  Follow up if symptoms persist, worsen or recur. 2. 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 Exam is negative. Suspect physiologic. Will check diagnostic mammography and ultrasound over this area rule out nonpalpable abnormalities. Patient knows she is to follow up for this and we'll help arrange the appointment. She'll call if she does not hear from my office within several  days.     Anastasio Auerbach MD, 3:41 PM 08/20/2014

## 2014-08-21 ENCOUNTER — Telehealth: Payer: Self-pay | Admitting: *Deleted

## 2014-08-21 DIAGNOSIS — N644 Mastodynia: Secondary | ICD-10-CM

## 2014-08-21 LAB — URINE CULTURE

## 2014-08-21 NOTE — Telephone Encounter (Signed)
Orders placed the below at breast center, orders placed, they will contact pt to schedule

## 2014-08-21 NOTE — Telephone Encounter (Signed)
-----   Message from Anastasio Auerbach, MD sent at 08/20/2014  3:44 PM EST ----- Schedule diagnostic mammography and ultrasound of right breast. Reference right tail of Spence mastalgia. No palpable abnormalities. She is due for her regular mammogram also think be done at the same time for the left breast.

## 2014-08-21 NOTE — Telephone Encounter (Signed)
Appointment 08/28/14 @ 1:40 pm

## 2014-08-28 ENCOUNTER — Ambulatory Visit
Admission: RE | Admit: 2014-08-28 | Discharge: 2014-08-28 | Disposition: A | Discharge status: Home / Self Care | Payer: Medicare Other | Source: Ambulatory Visit | Attending: Gynecology | Admitting: Gynecology

## 2014-08-28 DIAGNOSIS — N644 Mastodynia: Secondary | ICD-10-CM

## 2014-10-30 ENCOUNTER — Telehealth: Payer: Self-pay | Admitting: Cardiovascular Disease

## 2014-11-02 ENCOUNTER — Emergency Department (INDEPENDENT_AMBULATORY_CARE_PROVIDER_SITE_OTHER)
Admission: EM | Admit: 2014-11-02 | Discharge: 2014-11-02 | Disposition: A | Discharge status: Home / Self Care | Payer: Medicare Other | Source: Home / Self Care | Attending: Emergency Medicine | Admitting: Emergency Medicine

## 2014-11-02 DIAGNOSIS — H6091 Unspecified otitis externa, right ear: Secondary | ICD-10-CM | POA: Diagnosis not present

## 2014-11-02 MED ORDER — NEOMYCIN-POLYMYXIN-HC 3.5-10000-1 OT SUSP
4.0000 [drp] | Freq: Three times a day (TID) | OTIC | Status: DC
Start: 2014-11-02 — End: 2015-05-28

## 2014-11-02 NOTE — ED Provider Notes (Signed)
CSN: [id number]     Arrival date & time 11/02/14  1122 History   First MD Initiated Contact with Patient 11/02/14 1240     Chief Complaint  Patient presents with  . Otalgia   (Consider location/radiation/quality/duration/timing/severity/associated sxs/prior Treatment) Patient is a 69 y.o. female presenting with ear pain. The history is provided by the patient.  Otalgia Location:  Right Behind ear:  No abnormality Quality:  Aching and burning Severity:  Mild Onset quality:  Gradual Duration: few days. Timing:  Constant Progression:  Unchanged Chronicity:  New Associated symptoms: congestion   Associated symptoms: no ear discharge, no fever, no headaches, no hearing loss, no neck pain, no rash, no rhinorrhea, no sore throat, no tinnitus and no vomiting   Associated symptoms comment:  Occasional mild nausea and dizziness with post nasal drainage   Past Medical History  Diagnosis Date  . Anemia   . Disorder of vocal cord   . Hernia   . CHF (congestive heart failure)   . GERD (gastroesophageal reflux disease)   . Heart murmur   . Thyroid disease   . Upper respiratory infection   . Asthmatic bronchitis   . CHF (congestive heart failure)   . GERD (gastroesophageal reflux disease)   . Anxiety disorder   . OSA on CPAP 03/28/07    sleep study-[facility] 9.42/HR and during REM sleep '@37'$ .50/hr. The average 02 sat duringREM AND NREM was 97.0%  . Benign essential HTN 08/06/11    ECHO-EF>55%  . Hyperlipidemia 03/14/12    Lexiscan myoview-WNL; unchanged from previous study.   Past Surgical History  Procedure Laterality Date  . Vocal cord surg    . Hysteroscopy    . Endometrial biopsy    . Dilation and curettage of uterus    . Nissen fundoplication    . Parathyroid surg    . Hernia repair    . Abdominal hysterectomy  2013    TAH BSO  . Oophorectomy      BSO  . Breast surgery      Papilloma  . Knee surgery      arthroscopic   Family History   Problem Relation Age of Onset  . Diabetes Father   . Hypertension Father   . Cancer Father     Prostate cancer  . Arthritis Father   . Gout Father   . Hypertension Mother   . Stroke Mother   . Breast cancer Maternal Grandmother     Age 57's  . Ovarian cancer Paternal Grandmother   . Breast cancer Sister     Age 55  . Kidney disease Sister    History  Substance Use Topics  . Smoking status: Never Smoker   . Smokeless tobacco: Not on file  . Alcohol Use: No   OB History    Gravida Para Term Preterm AB TAB SAB Ectopic Multiple Living   '4 4 4       4     '$ Review of Systems  Constitutional: Negative for fever.  HENT: Positive for congestion and ear pain. Negative for ear discharge, hearing loss, rhinorrhea, sore throat and tinnitus.   Gastrointestinal: Negative for vomiting.  Musculoskeletal: Negative for neck pain.  Skin: Negative for rash.  Neurological: Negative for headaches.  All other systems reviewed and are negative.   Allergies  Midazolam hcl; Codeine; and Sulfonamide derivatives  Home Medications   Prior to Admission medications   Medication Sig Start Date End Date Taking? Authorizing Provider  albuterol (  PROVENTIL HFA;VENTOLIN HFA) 108 (90 BASE) MCG/ACT inhaler Inhale 2 puffs into the lungs every 4 (four) hours as needed for wheezing. 02/18/12 02/13/14  Corlis Leak, MD  aspirin 81 MG tablet Take 162 mg by mouth at bedtime.     Historical Provider, MD  atorvastatin (LIPITOR) 40 MG tablet Take 1 tablet (40 mg total) by mouth daily. 03/06/14   Troy Sine, MD  Calcium Carbonate-Vitamin D (CALCIUM + D PO) Take 1 tablet by mouth daily.     Historical Provider, MD  carvedilol (COREG CR) 40 MG 24 hr capsule Take 1 capsule (40 mg total) by mouth every evening. 03/06/14   Troy Sine, MD  Dextromethorphan-Guaifenesin (MUCINEX DM MAXIMUM STRENGTH) 60-1200 MG TB12 Take 1 tablet by mouth daily.    Historical Provider, MD  digoxin (LANOXIN) 0.125 MG tablet Take 1 tablet  (125 mcg total) by mouth daily. 03/25/14   Troy Sine, MD  esomeprazole (NEXIUM) 40 MG capsule Take 1 capsule (40 mg total) by mouth 2 (two) times daily before a meal. 03/06/14   Troy Sine, MD  furosemide (LASIX) 40 MG tablet Take 1 tablet (40 mg total) by mouth 2 (two) times daily. 03/06/14   Troy Sine, MD  metroNIDAZOLE (FLAGYL) 500 MG tablet Take 1 tablet (500 mg total) by mouth 2 (two) times daily. For 7 days.  Avoid alcohol while taking 08/20/14   Anastasio Auerbach, MD  montelukast (SINGULAIR) 10 MG tablet Take 10 mg by mouth at bedtime.    Historical Provider, MD  Multiple Vitamin (MULTIVITAMIN) tablet Take 1 tablet by mouth daily.      Historical Provider, MD  neomycin-polymyxin-hydrocortisone (CORTISPORIN) 3.5-10000-1 otic suspension Place 4 drops into the right ear 3 (three) times daily. X 7 days 11/02/14   Lutricia Feil, PA  NON FORMULARY CPAP THERAPY    Historical Provider, MD  NONFORMULARY OR COMPOUNDED ITEM Boric acid vaginal suppositories 600 mg one per vagina as needed for yeast 04/23/13   Anastasio Auerbach, MD  spironolactone (ALDACTONE) 25 MG tablet Take 0.5 tablets (12.5 mg total) by mouth daily. 03/06/14   Troy Sine, MD  valsartan (DIOVAN) 160 MG tablet Take 1 tablet (160 mg total) by mouth 2 (two) times daily. 03/06/14   Troy Sine, MD  vitamin E 400 UNIT capsule Take 400 Units by mouth daily.    Historical Provider, MD  zolpidem (AMBIEN) 10 MG tablet Take 10 mg by mouth at bedtime as needed. For sleep    Historical Provider, MD   BP 134/81 mmHg  Pulse 51  Temp(Src) 97.8 F (36.6 C) (Oral)  Resp 16  SpO2 98% Physical Exam  Constitutional: She is oriented to person, place, and time. She appears well-developed and well-nourished. No distress.  HENT:  Head: Normocephalic and atraumatic.  Right Ear: Hearing and tympanic membrane normal. There is tenderness. No mastoid tenderness. Tympanic membrane is not injected, not perforated and not erythematous.  No middle ear effusion. No decreased hearing is noted.  Left Ear: Hearing, tympanic membrane, external ear and ear canal normal. No mastoid tenderness.  Nose: Nose normal.  Mouth/Throat: Uvula is midline, oropharynx is clear and moist and mucous membranes are normal.  Tender right ear canal with moderate white exudate in canal  Eyes: Conjunctivae are normal. No scleral icterus.  Neck: Normal range of motion. Neck supple.  Cardiovascular: Normal rate, regular rhythm and normal heart sounds.   Pulmonary/Chest: Effort normal and breath sounds normal. No respiratory distress.  She has no wheezes.  Musculoskeletal: Normal range of motion.  Lymphadenopathy:    She has no cervical adenopathy.  Neurological: She is alert and oriented to person, place, and time. She has normal strength. No cranial nerve deficit or sensory deficit. Coordination and gait normal. GCS eye subscore is 4. GCS verbal subscore is 5. GCS motor subscore is 6.  Skin: Skin is warm and dry.  Psychiatric: She has a normal mood and affect. Her behavior is normal.  Nursing note and vitals reviewed.   ED Course  Procedures (including critical care time) Labs Review Labs Reviewed - No data to display  Imaging Review No results found.   MDM   1. Otitis externa, right   Cortisporin as as directed. Tylenol or ibuprofen as directed for pain.  PCP follow up if no improvement    Lutricia Feil, [place] 11/02/14 1434

## 2014-11-02 NOTE — ED Notes (Signed)
Bilateral ear pain, nausea and dizzy x 3 days.

## 2014-11-02 NOTE — Discharge Instructions (Signed)

## 2014-11-06 NOTE — Telephone Encounter (Signed)
Closed encounter °

## 2014-11-28 ENCOUNTER — Telehealth: Payer: Self-pay | Admitting: Cardiovascular Disease

## 2014-11-28 NOTE — Telephone Encounter (Signed)
Agree with plan 

## 2014-11-28 NOTE — Telephone Encounter (Signed)
Pt feet and legs are swollen a lot. She thinks she needs to be seen.

## 2014-11-28 NOTE — Telephone Encounter (Signed)
Spoke with patient who states she noticed bilateral LE edema that started last week. She states she has noticed her shoes were tight and the swelling progressed into her ankle area. She also reports some abdominal edema and said she is "bloated". She denies SOB. She does not endorse weight gain but also states she has been intentionally trying to lose weight and has not been weighing herself regularly.   Per triage protocol, patient can take extra diuretic x1 (1-2 extra doses) - patient notified of this.  She is aware to call back if symptoms do not improve.   Message routed to Dr. Claiborne Billings for further advice as necessary

## 2014-12-04 ENCOUNTER — Telehealth: Payer: Self-pay | Admitting: Cardiovascular Disease

## 2014-12-04 NOTE — Telephone Encounter (Signed)
Spoke with pt, she is having pain in her legs from the knees down. They are not discolored and a little swelling is present. She is having trouble walking due to the pain and they are also hurting at rest. She has an appt to see the orthopedic doctor tomorrow and will have them give [place] a call if they feel we need to see her. She will take tylenol and keep legs elevated today. Pt agreed with this plan.

## 2014-12-04 NOTE — Telephone Encounter (Signed)
Message from the Answering Service:Both legs are in pain with some swelling.

## 2015-01-02 ENCOUNTER — Telehealth: Payer: Self-pay | Admitting: *Deleted

## 2015-01-02 NOTE — Telephone Encounter (Signed)
Faxed CPAP supply order to [facility].

## 2015-01-03 ENCOUNTER — Encounter: Payer: Self-pay | Admitting: Cardiovascular Disease

## 2015-01-03 ENCOUNTER — Ambulatory Visit (INDEPENDENT_AMBULATORY_CARE_PROVIDER_SITE_OTHER): Payer: Medicare Other | Admitting: Cardiovascular Disease

## 2015-01-03 VITALS — BP 110/60 | HR 64 | Ht 64.0 in | Wt 233.0 lb

## 2015-01-03 DIAGNOSIS — R0789 Other chest pain: Secondary | ICD-10-CM

## 2015-01-03 DIAGNOSIS — E785 Hyperlipidemia, unspecified: Secondary | ICD-10-CM | POA: Diagnosis not present

## 2015-01-03 DIAGNOSIS — Z79899 Other long term (current) drug therapy: Secondary | ICD-10-CM

## 2015-01-03 DIAGNOSIS — Z9989 Dependence on other enabling machines and devices: Secondary | ICD-10-CM

## 2015-01-03 DIAGNOSIS — R079 Chest pain, unspecified: Secondary | ICD-10-CM | POA: Insufficient documentation

## 2015-01-03 DIAGNOSIS — I509 Heart failure, unspecified: Secondary | ICD-10-CM | POA: Diagnosis not present

## 2015-01-03 DIAGNOSIS — G4733 Obstructive sleep apnea (adult) (pediatric): Secondary | ICD-10-CM

## 2015-01-03 DIAGNOSIS — I10 Essential (primary) hypertension: Secondary | ICD-10-CM

## 2015-01-03 DIAGNOSIS — R0602 Shortness of breath: Secondary | ICD-10-CM | POA: Insufficient documentation

## 2015-01-03 NOTE — Progress Notes (Signed)
Patient ID: Jamie Ayers, female   DOB: 22-Dec-1945, 69 y.o.   MRN: [id number]      HPI: Jamie Ayers is a 69 y.o. female who presents to the office for a one year cardiology evaluation.  Jamie Ayers has a remote history of a nonischemic cardiomyopathy.  In December 2005 Jamie Ayers ejection fraction was 20%. With aggressive medical therapy LV function normalized to approximately 55%. Jamie Ayers has a history of severe obstructive sleep apnea and has been utilizing CPAP therapy since 2008. 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 Jamie Ayers also has undergone papilloma from a breast duct removal at the time of the hysterectomy.  Jamie Ayers last echo Doppler study was done 03/20/2013. This showed an ejection fraction at 50-55% with mild concentric left ventricular hypertrophy. Jamie Ayers had normal diastolic function. There was evidence for mild aortic insufficiency and mild mitral regurg patient with a centrally directed jet. Jamie Ayers left atrium was mildly dilated. Jamie Ayers recently had repeat laboratory done by Dr. Kevan Ny.  Laboratory on 10/05/2013 revealed hemoglobin11.6/ hematocrit 36.0.  Jamie Ayers did have microcytic indices at 73.5.  Jamie Ayers denies any history of sickle cell trait or thalassemia.  Jamie Ayers is unaware of blood loss.  Glucose was elevated at 147.  Jamie Ayers tells me in the past Dr. Marlou Sa had tried metformin but Jamie Ayers did not tolerate this.  TSH was 1.054.  Last saw Jamie Ayers, Jamie Ayers admits to experiencing mild episodes of chest pain.  At times Jamie Ayers notes this when Jamie Ayers is lying down but other times Jamie Ayers notes this when Jamie Ayers is walking.  Jamie Ayers does experience shortness of breath with activity.  Jamie Ayers denies palpitations.  Jamie Ayers denies dizziness.  Jamie Ayers denies PND, orthopnea.    Jamie Ayers is continuing to use CPAP with 100% compliance.  Jamie Ayers notes occasional right leg discomfort for which Jamie Ayers's been evaluated by Dr. Berenice Primas.  Jamie Ayers presents for evaluation.  Past Medical History    Diagnosis Date  . Anemia   . Disorder of vocal cord   . Hernia   . CHF (congestive heart failure)   . GERD (gastroesophageal reflux disease)   . Heart murmur   . Thyroid disease   . Upper respiratory infection   . Asthmatic bronchitis   . CHF (congestive heart failure)   . GERD (gastroesophageal reflux disease)   . Anxiety disorder   . OSA on CPAP 03/28/07    sleep study-[facility] 9.42/HR and during REM sleep '@37' .50/hr. The average 02 sat duringREM AND NREM was 97.0%  . Benign essential HTN 08/06/11    ECHO-EF>55%  . Hyperlipidemia 03/14/12    Lexiscan myoview-WNL; unchanged from previous study.    Past Surgical History  Procedure Laterality Date  . Vocal cord surg    . Hysteroscopy    . Endometrial biopsy    . Dilation and curettage of uterus    . Nissen fundoplication    . Parathyroid surg    . Hernia repair    . Abdominal hysterectomy  2013    TAH BSO  . Oophorectomy      BSO  . Breast surgery      Papilloma  . Knee surgery      arthroscopic    Allergies  Allergen Reactions  . Midazolam Hcl     Cardiac arrest    VERSED  . Codeine Hives and Itching  . Sulfonamide Derivatives Hives and Itching    Current  Outpatient Prescriptions  Medication Sig Dispense Refill  . aspirin 81 MG tablet Take 162 mg by mouth at bedtime.     Marland Kitchen atorvastatin (LIPITOR) 40 MG tablet Take 1 tablet (40 mg total) by mouth daily. 90 tablet 3  . Calcium Carbonate-Vitamin D (CALCIUM + D PO) Take 1 tablet by mouth daily.     . carvedilol (COREG CR) 40 MG 24 hr capsule Take 1 capsule (40 mg total) by mouth every evening. 90 capsule 3  . Dextromethorphan-Guaifenesin (MUCINEX DM MAXIMUM STRENGTH) 60-1200 MG TB12 Take 1 tablet by mouth daily.    . digoxin (LANOXIN) 0.125 MG tablet Take 1 tablet (125 mcg total) by mouth daily. 30 tablet 6  . esomeprazole (NEXIUM) 40 MG capsule Take 1 capsule (40 mg total) by mouth 2 (two) times daily before a meal. 180 capsule 3  .  furosemide (LASIX) 40 MG tablet Take 1 tablet (40 mg total) by mouth 2 (two) times daily. 180 tablet 3  . metroNIDAZOLE (FLAGYL) 500 MG tablet Take 1 tablet (500 mg total) by mouth 2 (two) times daily. For 7 days.  Avoid alcohol while taking 14 tablet 0  . montelukast (SINGULAIR) 10 MG tablet Take 10 mg by mouth at bedtime.    . Multiple Vitamin (MULTIVITAMIN) tablet Take 1 tablet by mouth daily.      Marland Kitchen neomycin-polymyxin-hydrocortisone (CORTISPORIN) 3.5-10000-1 otic suspension Place 4 drops into the right ear 3 (three) times daily. X 7 days 10 mL 0  . NON FORMULARY CPAP THERAPY    . NONFORMULARY OR COMPOUNDED ITEM Boric acid vaginal suppositories 600 mg one per vagina as needed for yeast 30 each 3  . spironolactone (ALDACTONE) 25 MG tablet Take 0.5 tablets (12.5 mg total) by mouth daily. 45 tablet 3  . valsartan (DIOVAN) 160 MG tablet Take 1 tablet (160 mg total) by mouth 2 (two) times daily. 180 tablet 3  . vitamin E 400 UNIT capsule Take 400 Units by mouth daily.    Marland Kitchen zolpidem (AMBIEN) 10 MG tablet Take 10 mg by mouth at bedtime as needed. For sleep    . albuterol (PROVENTIL HFA;VENTOLIN HFA) 108 (90 BASE) MCG/ACT inhaler Inhale 2 puffs into the lungs every 4 (four) hours as needed for wheezing. 3.7 g 0   No current facility-administered medications for this visit.    History   Social History  . Marital Status: Married    Spouse Name: N/A  . Number of Children: N/A  . Years of Education: N/A   Occupational History  . Not on file.   Social History Main Topics  . Smoking status: Never Smoker   . Smokeless tobacco: Not on file  . Alcohol Use: No  . Drug Use: No  . Sexual Activity: Yes    Birth Control/ Protection: Post-menopausal, Surgical     Comment: HYST   Other Topics Concern  . Not on file   Social History Narrative    Family History  Problem Relation Age of Onset  . Diabetes Father   . Hypertension Father   . Cancer Father     Prostate cancer  . Arthritis Father    . Gout Father   . Hypertension Mother   . Stroke Mother   . Breast cancer Maternal Grandmother     Age 16's  . Ovarian cancer Paternal Grandmother   . Breast cancer Sister     Age 33  . Kidney disease Sister    Social history is notable in that Jamie Ayers is married  has 4 children 9 grandchildren. There is no tobacco or alcohol use.  ROS General: Negative; No fevers, chills, or night sweats;  HEENT: Occasional hoarseness.  In the past Jamie Ayers has had difficulty with vocal cord papillomas. No changes in vision or hearing, sinus congestion, difficulty swallowing Pulmonary: Negative; No cough, wheezing, shortness of breath, hemoptysis Cardiovascular: Negative; No chest pain, presyncope, syncope, palpitations GI: Negative; No nausea, vomiting, diarrhea, or abdominal pain GU: Negative; No dysuria, hematuria, or difficulty voiding Musculoskeletal: Positive for mild myalgias; no joint pain, or weakness Hematologic/Oncology: Negative; no easy bruising, bleeding Endocrine: Positive for diabetes mellitus.  No cold or heat intolerance. Neuro: Negative; no changes in balance, headaches Skin: Negative; No rashes or skin lesions Psychiatric: Negative; No behavioral problems, depression Sleep: Positive for sleep apnea, for which Jamie Ayers uses CPAP every night.  No residual snoring, daytime sleepiness, hypersomnolence, bruxism, restless legs, hypnogognic hallucinations, no cataplexy Other comprehensive 14 point system review is negative.   PE BP 110/60 mmHg  Pulse 64  Ht '5\' 4"'  (1.626 m)  Wt 233 lb (105.688 kg)  BMI 39.97 kg/m2   Wt Readings from Last 3 Encounters:  01/03/15 233 lb (105.688 kg)  06/05/14 236 lb (107.049 kg)  02/13/14 243 lb 9.6 oz (110.496 kg)   General: Alert, oriented, no distress.  Skin: normal turgor, no rashes HEENT: Normocephalic, atraumatic. Pupils round and reactive; sclera anicteric;no lid lag.  Nose without nasal septal hypertrophy Mouth/Parynx benign; Mallinpatti scale  3 Neck: Thick neck No JVD, no carotid bruits with normal carolti upstroke Lungs: clear to ausculatation and percussion; no wheezing or rales Chest wall: Nontender to palpation Heart: RRR, s1 s2 normal 2/6 systolic murmur; no diastolic murmur, rubs, thrills or heaves Abdomen: Significant central or possibly soft, nontender; no hepatosplenomehaly, BS+; abdominal aorta nontender and not dilated by palpation. Back: No CVA tenderness Pulses 2+ Extremities: Trace ankle swelling; no clubbing cyanosis, Homan's sign negative  Neurologic: grossly nonfocal Psychologic: Normal affect and mood  ECG (independently read by me): Normal sinus rhythm at 64 bpm..  Borderline LVH.  No cigarette ST changes.  August 2015 ECG (independently read by me): Normal sinus rhythm at 67 beats per minute.  Moderate voltage criteria for LVH.  No significant ST changes.  Borderline first degree AV block with a PR interval at 208 ms.  10/08/2013 ECG (independently read by me): Sinus rhythm at 59 beats per minute.  Borderline first-degree block with PR interval of 220 ms.  QTc interval normal.  Prior 04/17/2013 ECG: Normal sinus rhythm at 62 beats per minute. Borderline first degree AV block is no longer present with Jamie Ayers PR interval now at 184 ms.  LABS: BMP Latest Ref Rng 10/05/2013 02/05/2013 02/18/2012  Glucose 70 - 99 mg/dL 147(H) 144(H) 117(H)  BUN 6 - 23 mg/dL 17 24(H) 26(H)  Creatinine 0.50 - 1.10 mg/dL 0.95 0.97 1.01  Sodium 135 - 145 mEq/L 141 140 140  Potassium 3.5 - 5.3 mEq/L 4.0 4.2 3.9  Chloride 96 - 112 mEq/L 102 100 100  CO2 19 - 32 mEq/L 29 32 29  Calcium 8.4 - 10.5 mg/dL 9.6 10.3 10.4    Hepatic Function Latest Ref Rng 10/05/2013 02/05/2013  Total Protein 6.0 - 8.3 g/dL 6.9 6.9  Albumin 3.5 - 5.2 g/dL 4.1 4.5  AST 0 - 37 U/L 14 20  ALT 0 - 35 U/L 20 41(H)  Alk Phosphatase 39 - 117 U/L 86 82  Total Bilirubin 0.2 - 1.2 mg/dL 0.8 1.0   CBC Latest  Ref Rng 10/05/2013 02/05/2013 02/18/2012  WBC 4.0 - 10.5  K/uL 7.4 8.1 6.6  Hemoglobin 12.0 - 15.0 g/dL 11.6(L) 12.1 11.3(L)  Hematocrit 36.0 - 46.0 % 36.0 38.3 35.2(L)  Platelets 150 - 400 K/uL 241 247 166   Lab Results  Component Value Date   MCV 73.5* 10/05/2013   MCV 76.1* 02/05/2013   MCV 72.6* 02/18/2012   Lab Results  Component Value Date   TSH 1.054 10/05/2013   Lab Results  Component Value Date   HGBA1C 8.4* 10/08/2013   Lipid Panel     Component Value Date/Time   CHOL 190 10/08/2013 0953   TRIG 118 10/08/2013 0953   HDL 61 10/08/2013 0953   CHOLHDL 3.1 10/08/2013 0953   VLDL 24 10/08/2013 0953   LDLCALC 105* 10/08/2013 0953    RADIOLOGY: No results found.    ASSESSMENT AND PLAN: Jamie Ayers is a 69 year old African-American female who has a history of morbid obesity and a history of a nonischemic cardiomyopathy with an ejection fraction of 20% which subsequently has normalized.  Jamie Ayers last echo Doppler study in September 2014 continued to show normal systolic function.  A nuclear perfusion study  in September 2013 showed normal perfusion without scar or ischemia.  Jamie Ayers has severe sleep apnea and is utilizing Jamie Ayers CPAP therapy.  Jamie Ayers presents today with complaint of vague chest pain which can occur with lying down but also with walking.  Jamie Ayers also has noticed some increased shortness of breath with exertion.  I am recommending follow-up evaluation to include a complete set of laboratory in the fasting state, a 2-D echo Doppler study to reassess systolic and diastolic function, and I will also schedule Jamie Ayers for a Lexiscan Myoview study to make certain Jamie Ayers symptoms are not ischemia mediated.  Again discussed importance of weight loss.  Jamie Ayers continues to use Jamie Ayers CPAP with 100% compliance.  Jamie Ayers continues to be on Lipitor 40 mg daily for hyperlipidemia.  Jamie Ayers blood pressure today is controlled on furosemide 42 g twice a day, Aldactone 12.5 mg daily, valsartan 160 mg twice a day.  Jamie Ayers also is on carvedilol CR 40 mg daily.  I will see Jamie Ayers in  2-3 months in follow-up of the above testing and further recommendations will be made at that time.  Time spent: 25 minutes  Troy Sine, MD, [facility]  01/03/2015 11:18 AM

## 2015-01-03 NOTE — Patient Instructions (Signed)
Your physician has requested that you have an echocardiogram. Echocardiography is a painless test that uses sound waves to create images of your heart. It provides your doctor with information about the size and shape of your heart and how well your heart's chambers and valves are working. This procedure takes approximately one hour. There are no restrictions for this procedure.  Your physician has requested that you have a lexiscan myoview. For further information please visit [email or web address]. Please follow instruction sheet, as given.   Your physician recommends that you return for lab work fasting.  Your physician recommends that you schedule a follow-up appointment in: 2-3 months.

## 2015-01-06 ENCOUNTER — Telehealth: Payer: Self-pay | Admitting: Cardiovascular Disease

## 2015-01-14 NOTE — Addendum Note (Signed)
Addended by: Diana Eves on: 01/14/2015 03:00 PM   Modules accepted: Orders

## 2015-01-23 ENCOUNTER — Telehealth (HOSPITAL_COMMUNITY): Payer: Self-pay

## 2015-01-23 NOTE — Telephone Encounter (Signed)
Patient given detailed instructions per Myocardial Perfusion Study Information Sheet for test on 01-24-2015 at 1145. Patient Notified to arrive 15 minutes early, and that it is imperative to arrive on time for appointment to keep from having the test rescheduled. Patient verbalized understanding. Oletta Lamas, Obdulia Steier A

## 2015-01-24 ENCOUNTER — Ambulatory Visit (HOSPITAL_COMMUNITY): Payer: Medicare Other | Attending: Cardiovascular Disease

## 2015-01-24 DIAGNOSIS — R0609 Other forms of dyspnea: Secondary | ICD-10-CM | POA: Insufficient documentation

## 2015-01-24 DIAGNOSIS — I10 Essential (primary) hypertension: Secondary | ICD-10-CM | POA: Diagnosis not present

## 2015-01-24 DIAGNOSIS — R0789 Other chest pain: Secondary | ICD-10-CM | POA: Insufficient documentation

## 2015-01-24 DIAGNOSIS — R9439 Abnormal result of other cardiovascular function study: Secondary | ICD-10-CM | POA: Insufficient documentation

## 2015-01-24 LAB — MYOCARDIAL PERFUSION IMAGING
CHL CUP NUCLEAR SRS: 3
CHL CUP NUCLEAR SSS: 5
CHL CUP RESTING HR STRESS: 59 {beats}/min
LHR: 0.25
LV dias vol: 131 mL
LVSYSVOL: 46 mL
NUC STRESS TID: 1.06
Peak HR: 77 {beats}/min
SDS: 2

## 2015-01-24 MED ORDER — TECHNETIUM TC 99M SESTAMIBI GENERIC - CARDIOLITE
30.7000 | Freq: Once | INTRAVENOUS | Status: AC | PRN
  Administered 2015-01-24: 30.7 via INTRAVENOUS

## 2015-01-24 MED ORDER — TECHNETIUM TC 99M SESTAMIBI GENERIC - CARDIOLITE
10.4000 | Freq: Once | INTRAVENOUS | Status: AC | PRN
  Administered 2015-01-24: 10 via INTRAVENOUS

## 2015-01-24 MED ORDER — REGADENOSON 0.4 MG/5ML IV SOLN
0.4000 mg | Freq: Once | INTRAVENOUS | Status: AC
  Administered 2015-01-24: 0.4 mg via INTRAVENOUS

## 2015-01-27 ENCOUNTER — Telehealth: Payer: Self-pay | Admitting: *Deleted

## 2015-01-27 ENCOUNTER — Telehealth: Payer: Self-pay | Admitting: Cardiovascular Disease

## 2015-01-27 NOTE — Telephone Encounter (Signed)
Patient is returning a call from [place]

## 2015-01-27 NOTE — Telephone Encounter (Signed)
-----   Message from Troy Sine, MD sent at 01/25/2015  8:29 AM EDT ----- Low risk study; nl EF, minimal defect

## 2015-01-27 NOTE — Telephone Encounter (Signed)
Informed patient of stress test results.

## 2015-01-28 ENCOUNTER — Encounter (HOSPITAL_COMMUNITY): Payer: Medicare Other

## 2015-01-28 ENCOUNTER — Ambulatory Visit (HOSPITAL_COMMUNITY): Payer: Medicare Other | Attending: Cardiovascular Disease

## 2015-01-28 ENCOUNTER — Other Ambulatory Visit: Payer: Self-pay

## 2015-01-28 DIAGNOSIS — Z6839 Body mass index (BMI) 39.0-39.9, adult: Secondary | ICD-10-CM | POA: Diagnosis not present

## 2015-01-28 DIAGNOSIS — E669 Obesity, unspecified: Secondary | ICD-10-CM | POA: Diagnosis not present

## 2015-01-28 DIAGNOSIS — I517 Cardiomegaly: Secondary | ICD-10-CM | POA: Diagnosis not present

## 2015-01-28 DIAGNOSIS — I351 Nonrheumatic aortic (valve) insufficiency: Secondary | ICD-10-CM | POA: Diagnosis not present

## 2015-01-28 DIAGNOSIS — I340 Nonrheumatic mitral (valve) insufficiency: Secondary | ICD-10-CM | POA: Diagnosis not present

## 2015-01-28 DIAGNOSIS — E785 Hyperlipidemia, unspecified: Secondary | ICD-10-CM | POA: Diagnosis not present

## 2015-01-28 DIAGNOSIS — I509 Heart failure, unspecified: Secondary | ICD-10-CM | POA: Diagnosis present

## 2015-01-29 ENCOUNTER — Ambulatory Visit (HOSPITAL_COMMUNITY): Payer: Medicare Other

## 2015-02-11 ENCOUNTER — Encounter: Payer: Self-pay | Admitting: *Deleted

## 2015-02-18 ENCOUNTER — Other Ambulatory Visit: Payer: Self-pay | Admitting: Cardiovascular Disease

## 2015-02-18 MED ORDER — ESOMEPRAZOLE MAGNESIUM 40 MG PO CPDR: 40.0000 mg | DELAYED_RELEASE_CAPSULE | Freq: Two times a day (BID) | ORAL | Status: DC

## 2015-02-18 MED ORDER — VALSARTAN 160 MG PO TABS: 160.0000 mg | ORAL_TABLET | Freq: Two times a day (BID) | ORAL | Status: DC

## 2015-02-18 MED ORDER — SPIRONOLACTONE 25 MG PO TABS: 12.5000 mg | ORAL_TABLET | Freq: Every day | ORAL | Status: DC

## 2015-02-18 MED ORDER — CARVEDILOL PHOSPHATE ER 40 MG PO CP24: 40.0000 mg | ORAL_CAPSULE | Freq: Every evening | ORAL | Status: DC

## 2015-02-18 NOTE — Telephone Encounter (Signed)
°  1. Which medications need to be refilled? Valsartan, Spirolactone, Nexium, and Carvedilol  2. Which pharmacy is medication to be sent to? [facility]   3. Do they need a 30 day or 90 day supply? 90   4. Would they like a call back once the medication has been sent to the pharmacy? Yes

## 2015-02-18 NOTE — Telephone Encounter (Signed)
E-sent medication to mail order Left message on voicemail for patient.

## 2015-02-19 ENCOUNTER — Encounter: Payer: Self-pay | Admitting: Cardiovascular Disease

## 2015-04-14 ENCOUNTER — Encounter: Payer: Self-pay | Admitting: Cardiovascular Disease

## 2015-04-14 ENCOUNTER — Ambulatory Visit (INDEPENDENT_AMBULATORY_CARE_PROVIDER_SITE_OTHER): Payer: Medicare Other | Admitting: Cardiovascular Disease

## 2015-04-14 VITALS — BP 110/80 | HR 68 | Ht 64.5 in | Wt 221.7 lb

## 2015-04-14 DIAGNOSIS — J381 Polyp of vocal cord and larynx: Secondary | ICD-10-CM | POA: Diagnosis not present

## 2015-04-14 DIAGNOSIS — I10 Essential (primary) hypertension: Secondary | ICD-10-CM | POA: Diagnosis not present

## 2015-04-14 DIAGNOSIS — I429 Cardiomyopathy, unspecified: Secondary | ICD-10-CM | POA: Diagnosis not present

## 2015-04-14 DIAGNOSIS — G4733 Obstructive sleep apnea (adult) (pediatric): Secondary | ICD-10-CM

## 2015-04-14 DIAGNOSIS — Z9989 Dependence on other enabling machines and devices: Secondary | ICD-10-CM

## 2015-04-14 DIAGNOSIS — E785 Hyperlipidemia, unspecified: Secondary | ICD-10-CM

## 2015-04-14 DIAGNOSIS — I428 Other cardiomyopathies: Secondary | ICD-10-CM

## 2015-04-14 MED ORDER — FUROSEMIDE 40 MG PO TABS: 40.0000 mg | ORAL_TABLET | Freq: Every day | ORAL | Status: DC

## 2015-04-14 MED ORDER — ATORVASTATIN CALCIUM 40 MG PO TABS: 20.0000 mg | ORAL_TABLET | Freq: Every day | ORAL | Status: DC

## 2015-04-14 NOTE — Patient Instructions (Signed)
Your physician has recommended you make the following change in your medication: the furosemide has been decreased to 40 mg daily. The atorvastatin has been decreased to 20 mg daily.  Your physician wants you to follow-up in: 6 months or sooner if needed. You will receive a reminder letter in the mail two months in advance. If you don't receive a letter, please call our office to schedule the follow-up appointment.   If you need a refill on your cardiac medications before your next appointment, please call your pharmacy.

## 2015-04-15 ENCOUNTER — Encounter: Payer: Self-pay | Admitting: Cardiovascular Disease

## 2015-04-15 NOTE — Progress Notes (Signed)
Patient ID: Jamie Ayers, female   DOB: December 19, 1945, 69 y.o.   MRN: [id number]      HPI: Jamie Ayers is a 69 y.o. female who presents to the office for a 3 monthcardiology evaluation.  Jamie Ayers has a remote history of a nonischemic cardiomyopathy.  In December 2005 her ejection fraction was 20%. With aggressive medical therapy LV function normalized to approximately 55%. She has a history of severe obstructive sleep apnea and has been utilizing CPAP therapy since 2008. 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 She also has undergone papilloma from a breast duct removal at the time of the hysterectomy.  Her last echo Doppler study was done 03/20/2013. This showed an ejection fraction at 50-55% with mild concentric left ventricular hypertrophy. She had normal diastolic function. There was evidence for mild aortic insufficiency and mild mitral regurg patient with a centrally directed jet. Her left atrium was mildly dilated. She recently had repeat laboratory done by Dr. Kevan Ny.  Laboratory on 10/05/2013 revealed hemoglobin11.6/ hematocrit 36.0.  She did have microcytic indices at 73.5.  She denies any history of sickle cell trait or thalassemia.  She is unaware of blood loss.  Glucose was elevated at 147.  She tells me in the past Dr. Marlou Sa had tried metformin but she did not tolerate this.  TSH was 1.054.  She is continuing to use CPAP with 100% compliance.  She denies daytime fatigue or awareness of breakthrough snoring.  She denies recent chest tightness.  She denies PND, orthopnea.  She has been trying to lose weight and admits to a 12 pound 1 loss over the past 3 months.  She is status post recent steroid injections in her knees, which has resulted in glucose elevation.  She had blood work done by Dr. Marlou Sa in August.  On 01/28/2015 she underwent a follow-up echo Doppler study.  This revealed an  ejection fraction at 60-65% without wall motion abnormalities.  There was grade 1 diastolic dysfunction, mild aortic and mild mitral regurgitation.  The left atrium was mildly dilated.  Recently, she has noticed some mild myalgias in her legs.  He denies any recent swelling.  She will be seeing Dr. Marlou Sa in November.  Past Medical History  Diagnosis Date  . Anemia   . Disorder of vocal cord   . Hernia   . CHF (congestive heart failure) ([place])   . GERD (gastroesophageal reflux disease)   . Heart murmur   . Thyroid disease   . Upper respiratory infection   . Asthmatic bronchitis   . CHF (congestive heart failure) ([place])   . GERD (gastroesophageal reflux disease)   . Anxiety disorder   . OSA on CPAP 03/28/07    sleep study-[facility]-AHI 9.42/HR and during REM sleep '@37' .50/hr. The average 02 sat duringREM AND NREM was 97.0%  . Benign essential HTN 08/06/11    ECHO-EF>55%  . Hyperlipidemia 03/14/12    Lexiscan myoview-WNL; unchanged from previous study.    Past Surgical History  Procedure Laterality Date  . Vocal cord surg    . Hysteroscopy    . Endometrial biopsy    . Dilation and curettage of uterus    . Nissen fundoplication    . Parathyroid surg    . Hernia repair    . Abdominal hysterectomy  2013    TAH BSO  . Oophorectomy  BSO  . Breast surgery      Papilloma  . Knee surgery      arthroscopic    Allergies  Allergen Reactions  . Midazolam Other (See Comments)    REACTION: cardiac arrest  . Midazolam Hcl     Cardiac arrest    VERSED  . Codeine Hives and Itching  . Morphine And Related Hives  . Paba Derivatives Nausea Only    PT REPORTS SEVERE NAUSEA AFTER SURGERY, DENIES VOMITING  . Sulfonamide Derivatives Hives and Itching  . Sulfamethoxazole Hives    Current Outpatient Prescriptions  Medication Sig Dispense Refill  . albuterol (PROVENTIL HFA;VENTOLIN HFA) 108 (90 BASE) MCG/ACT inhaler Inhale 2 puffs into the lungs every 4 (four)  hours as needed for wheezing. 3.7 g 0  . amoxicillin-clavulanate (AUGMENTIN) 875-125 MG tablet Take 1 tablet by mouth 2 (two) times daily.    Marland Kitchen aspirin 81 MG tablet Take 162 mg by mouth at bedtime.     Marland Kitchen atorvastatin (LIPITOR) 40 MG tablet Take 0.5 tablets (20 mg total) by mouth daily. 90 tablet 3  . Calcium Carbonate-Vitamin D (CALCIUM + D PO) Take 1 tablet by mouth daily.     . carvedilol (COREG CR) 40 MG 24 hr capsule Take 1 capsule (40 mg total) by mouth every evening. 90 capsule 3  . Dextromethorphan-Guaifenesin (MUCINEX DM MAXIMUM STRENGTH) 60-1200 MG TB12 Take 1 tablet by mouth daily.    . digoxin (LANOXIN) 0.125 MG tablet Take 1 tablet (125 mcg total) by mouth daily. 30 tablet 6  . esomeprazole (NEXIUM) 40 MG capsule Take 1 capsule (40 mg total) by mouth 2 (two) times daily before a meal. 180 capsule 3  . fentaNYL (DURAGESIC - DOSED MCG/HR) 25 MCG/HR patch USE AS DIRECTED  0  . furosemide (LASIX) 40 MG tablet Take 1 tablet (40 mg total) by mouth daily. 180 tablet 3  . metroNIDAZOLE (FLAGYL) 500 MG tablet Take 1 tablet (500 mg total) by mouth 2 (two) times daily. For 7 days.  Avoid alcohol while taking 14 tablet 0  . montelukast (SINGULAIR) 10 MG tablet Take 10 mg by mouth at bedtime.    . Multiple Vitamin (MULTIVITAMIN) tablet Take 1 tablet by mouth daily.      Marland Kitchen neomycin-polymyxin-hydrocortisone (CORTISPORIN) 3.5-10000-1 otic suspension Place 4 drops into the right ear 3 (three) times daily. X 7 days 10 mL 0  . NON FORMULARY CPAP THERAPY    . NONFORMULARY OR COMPOUNDED ITEM Boric acid vaginal suppositories 600 mg one per vagina as needed for yeast 30 each 3  . sitaGLIPtin (JANUVIA) 100 MG tablet Take 50 mg by mouth daily.    Marland Kitchen spironolactone (ALDACTONE) 25 MG tablet Take 0.5 tablets (12.5 mg total) by mouth daily. 45 tablet 3  . valsartan (DIOVAN) 160 MG tablet Take 1 tablet (160 mg total) by mouth 2 (two) times daily. 180 tablet 3  . vitamin E 400 UNIT capsule Take 400 Units by mouth  daily.    Marland Kitchen zolpidem (AMBIEN) 10 MG tablet Take 10 mg by mouth at bedtime as needed. For sleep     No current facility-administered medications for this visit.    Social History   Social History  . Marital Status: Married    Spouse Name: N/A  . Number of Children: N/A  . Years of Education: N/A   Occupational History  . Not on file.   Social History Main Topics  . Smoking status: Never Smoker   . Smokeless tobacco: Not  on file  . Alcohol Use: No  . Drug Use: No  . Sexual Activity: Yes    Birth Control/ Protection: Post-menopausal, Surgical     Comment: HYST   Other Topics Concern  . Not on file   Social History Narrative    Family History  Problem Relation Age of Onset  . Diabetes Father   . Hypertension Father   . Cancer Father     Prostate cancer  . Arthritis Father   . Gout Father   . Hypertension Mother   . Stroke Mother   . Breast cancer Maternal Grandmother     Age 14's  . Ovarian cancer Paternal Grandmother   . Breast cancer Sister     Age 11  . Kidney disease Sister    Social history is notable in that she is married has 4 children 9 grandchildren. There is no tobacco or alcohol use.  ROS General: Negative; No fevers, chills, or night sweats;  HEENT: Occasional hoarseness.  In the past she has had difficulty with vocal cord papillomas. No changes in vision or hearing, sinus congestion, difficulty swallowing Pulmonary: Negative; No cough, wheezing, shortness of breath, hemoptysis Cardiovascular: Negative; No chest pain, presyncope, syncope, palpitations GI: Negative; No nausea, vomiting, diarrhea, or abdominal pain GU: Negative; No dysuria, hematuria, or difficulty voiding Musculoskeletal: Positive for mild myalgias; no joint pain, or weakness Hematologic/Oncology: Negative; no easy bruising, bleeding Endocrine: Positive for diabetes mellitus.  No cold or heat intolerance. Neuro: Negative; no changes in balance, headaches Skin: Negative; No rashes  or skin lesions Psychiatric: Negative; No behavioral problems, depression Sleep: Positive for sleep apnea, for which she uses CPAP every night.  No residual snoring, daytime sleepiness, hypersomnolence, bruxism, restless legs, hypnogognic hallucinations, no cataplexy Other comprehensive 14 point system review is negative.   PE BP 110/80 mmHg  Pulse 68  Ht 5' 4.5" (1.638 m)  Wt 221 lb 11.2 oz (100.562 kg)  BMI 37.48 kg/m2   Wt Readings from Last 3 Encounters:  04/14/15 221 lb 11.2 oz (100.562 kg)  01/24/15 233 lb (105.688 kg)  01/03/15 233 lb (105.688 kg)   General: Alert, oriented, no distress.  Skin: normal turgor, no rashes HEENT: Normocephalic, atraumatic. Pupils round and reactive; sclera anicteric;no lid lag.  Nose without nasal septal hypertrophy Mouth/Parynx benign; Mallinpatti scale 3 Neck: Thick neck No JVD, no carotid bruits with normal carolti upstroke Lungs: clear to ausculatation and percussion; no wheezing or rales Chest wall: Nontender to palpation Heart: RRR, s1 s2 normal 2/6 systolic murmur; no diastolic murmur, rubs, thrills or heaves Abdomen: Significant central or possibly soft, nontender; no hepatosplenomehaly, BS+; abdominal aorta nontender and not dilated by palpation. Back: No CVA tenderness Pulses 2+ Extremities: Trace ankle swelling; no clubbing cyanosis, Homan's sign negative  Neurologic: grossly nonfocal Psychologic: Normal affect and mood  ECG (independently read by me): Normal sinus rhythm at 68 bpm.  LVH by voltage in aVL.  July 2016 ECG (independently read by me): Normal sinus rhythm at 64 bpm..  Borderline LVH.  No cigarette ST changes.  August 2015 ECG (independently read by me): Normal sinus rhythm at 67 beats per minute.  Moderate voltage criteria for LVH.  No significant ST changes.  Borderline first degree AV block with a PR interval at 208 ms.  10/08/2013 ECG (independently read by me): Sinus rhythm at 59 beats per minute.  Borderline  first-degree block with PR interval of 220 ms.  QTc interval normal.  Prior 04/17/2013 ECG: Normal sinus rhythm at  62 beats per minute. Borderline first degree AV block is no longer present with her PR interval now at 184 ms.  LABS: BMP Latest Ref Rng 10/05/2013 02/05/2013 02/18/2012  Glucose 70 - 99 mg/dL 147(H) 144(H) 117(H)  BUN 6 - 23 mg/dL 17 24(H) 26(H)  Creatinine 0.50 - 1.10 mg/dL 0.95 0.97 1.01  Sodium 135 - 145 mEq/L 141 140 140  Potassium 3.5 - 5.3 mEq/L 4.0 4.2 3.9  Chloride 96 - 112 mEq/L 102 100 100  CO2 19 - 32 mEq/L 29 32 29  Calcium 8.4 - 10.5 mg/dL 9.6 10.3 10.4    Hepatic Function Latest Ref Rng 10/05/2013 02/05/2013  Total Protein 6.0 - 8.3 g/dL 6.9 6.9  Albumin 3.5 - 5.2 g/dL 4.1 4.5  AST 0 - 37 U/L 14 20  ALT 0 - 35 U/L 20 41(H)  Alk Phosphatase 39 - 117 U/L 86 82  Total Bilirubin 0.2 - 1.2 mg/dL 0.8 1.0   CBC Latest Ref Rng 10/05/2013 02/05/2013 02/18/2012  WBC 4.0 - 10.5 K/uL 7.4 8.1 6.6  Hemoglobin 12.0 - 15.0 g/dL 11.6(L) 12.1 11.3(L)  Hematocrit 36.0 - 46.0 % 36.0 38.3 35.2(L)  Platelets 150 - 400 K/uL 241 247 166   Lab Results  Component Value Date   MCV 73.5* 10/05/2013   MCV 76.1* 02/05/2013   MCV 72.6* 02/18/2012   Lab Results  Component Value Date   TSH 1.054 10/05/2013   Lab Results  Component Value Date   HGBA1C 8.4* 10/08/2013   Lipid Panel     Component Value Date/Time   CHOL 190 10/08/2013 0953   TRIG 118 10/08/2013 0953   HDL 61 10/08/2013 0953   CHOLHDL 3.1 10/08/2013 0953   VLDL 24 10/08/2013 0953   LDLCALC 105* 10/08/2013 0953    RADIOLOGY: No results found.    ASSESSMENT AND PLAN: Ms. Frech is a 69 year old African-American female who has a history of morbid obesity and a history of a nonischemic cardiomyopathy with an ejection fraction of 20% which subsequently has normalized.  Her  echo Doppler study in September 2014 continued to show normal systolic function and her most recent one 2 months ago continued to show an  ejection fraction at 60-65% with grade 1 diastolic dysfunction..  A nuclear perfusion study in September 2013 showed normal perfusion without scar or ischemia.  She has severe sleep apnea and continues to utilize her CPAP therapy.  I reviewed recent blood work by Dr. Marlou Sa from August.  She had mild microcytic indices with an MCV of 75 and hemoglobin of 11.9 and hematocrit 38.5.  Her BUN was 34, creatinine 1.8.  At that time her hemoglobin A1c was 10.3 and she admits that she was receiving several steroid injections into her knee.  She admits to experiencing myalgias.  I will try to reduce her Lipitor from 40 mg to 20 mg to see if this improves her symptomatology.  She may benefit from the addition of coenzyme Q10.  There are no signs of edema.  On exam, and she now has normal to hyperdynamic LV function.  I will reduce her furosemide from 40 mg twice a day to 40 mg daily.  She will follow-up with Dr. Marlou Sa next month and repeat laboratory will be obtained.  I will see her in 6 months for reevaluation.   Time spent: 25 minutes  Troy Sine, MD, [facility]  04/15/2015 4:24 PM

## 2015-04-28 ENCOUNTER — Encounter: Payer: Self-pay | Admitting: Cardiovascular Disease

## 2015-05-28 ENCOUNTER — Telehealth: Payer: Self-pay | Admitting: *Deleted

## 2015-05-28 ENCOUNTER — Ambulatory Visit (INDEPENDENT_AMBULATORY_CARE_PROVIDER_SITE_OTHER): Payer: Medicare Other | Admitting: Gynecology

## 2015-05-28 ENCOUNTER — Encounter: Payer: Self-pay | Admitting: Gynecology

## 2015-05-28 VITALS — BP 124/78

## 2015-05-28 DIAGNOSIS — N3000 Acute cystitis without hematuria: Secondary | ICD-10-CM | POA: Diagnosis not present

## 2015-05-28 LAB — URINALYSIS W MICROSCOPIC + REFLEX CULTURE
Bilirubin Urine: NEGATIVE
CRYSTALS: NONE SEEN [HPF]
Casts: NONE SEEN [LPF]
GLUCOSE, UA: NEGATIVE
Hgb urine dipstick: NEGATIVE
KETONES UR: NEGATIVE
Nitrite: NEGATIVE
Protein, ur: NEGATIVE
RBC / HPF: NONE SEEN RBC/HPF (ref <=2)
Specific Gravity, Urine: 1.01 (ref 1.001–1.035)
Yeast: NONE SEEN [HPF]
pH: 5.5 (ref 5.0–8.0)

## 2015-05-28 MED ORDER — NITROFURANTOIN MONOHYD MACRO 100 MG PO CAPS: 100.0000 mg | ORAL_CAPSULE | Freq: Two times a day (BID) | ORAL | Status: DC

## 2015-05-28 MED ORDER — NONFORMULARY OR COMPOUNDED ITEM: Status: DC

## 2015-05-28 NOTE — Telephone Encounter (Signed)
Rx called in 

## 2015-05-28 NOTE — Patient Instructions (Signed)
Start on the antibiotic twice daily for 7 days.  Follow up with your primary physician if your discomfort continues for more involved evaluation. As you do not have a uterus or ovaries I do not think that it is female related.

## 2015-05-28 NOTE — Telephone Encounter (Signed)
-----   Message from Anastasio Auerbach, MD sent at 05/28/2015  9:34 AM EST ----- Patient needs refill for boric acid 600 mg vaginal suppositories at [id number] with 2 refills

## 2015-05-28 NOTE — Progress Notes (Signed)
Jamie Ayers 21-Feb-1946 [id number]        69 y.o.  [id number] Presents complaining of one-week history of lower abdominal discomfort. Notes some radiation to her back. Slight frequency but no dysuria or urgency. No fever or chills. Regular daily bowel movements. No nausea or vomiting. No vaginal discharge or odor. Status post TAH/BSO in the past.  Past medical history,surgical history, problem list, medications, allergies, family history and social history were all reviewed and documented in the EPIC chart.  Directed ROS with pertinent positives and negatives documented in the history of present illness/assessment and plan.  Exam: [name] assistant Filed Vitals:   05/28/15 0906  BP: 124/78   General appearance:  Normal Spine straight without CVA tenderness Abdomen soft nontender without masses guarding rebound. Pelvic external BS vagina with atrophic changes. Bimanual without masses or tenderness. Rectal exam is normal.  Assessment/Plan:  69 y.o. [id number] with history as above. Urinalysis  Suspicious for UTI. 20-40 WBC with few bacteria. Will cover with Macrobid 100 mg twice a day 7 days. Discussed with patient if her symptoms persist she needs to follow up with her primary physician for more involved evaluation. As she is status post TAH/BSO I doubt that it is GYN related and may represent intestinal or other sources which her primary physician can evaluate for. Patient agrees with the plan.    Anastasio Auerbach MD, 9:27 AM 05/28/2015

## 2015-05-28 NOTE — Addendum Note (Signed)
Addended by: Nelva Nay on: 05/28/2015 09:47 AM   Modules accepted: Orders

## 2015-05-29 LAB — URINE CULTURE: Colony Count: 10000

## 2015-06-11 ENCOUNTER — Other Ambulatory Visit (HOSPITAL_COMMUNITY)
Admission: RE | Admit: 2015-06-11 | Discharge: 2015-06-11 | Disposition: A | Discharge status: Home / Self Care | Payer: Medicare Other | Source: Ambulatory Visit | Attending: Gynecology | Admitting: Gynecology

## 2015-06-11 ENCOUNTER — Encounter: Payer: Self-pay | Admitting: Gynecology

## 2015-06-11 ENCOUNTER — Ambulatory Visit (INDEPENDENT_AMBULATORY_CARE_PROVIDER_SITE_OTHER): Payer: Medicare Other | Admitting: Gynecology

## 2015-06-11 VITALS — BP 118/70 | Ht 63.0 in | Wt 223.0 lb

## 2015-06-11 DIAGNOSIS — Z124 Encounter for screening for malignant neoplasm of cervix: Secondary | ICD-10-CM | POA: Diagnosis present

## 2015-06-11 DIAGNOSIS — N760 Acute vaginitis: Secondary | ICD-10-CM

## 2015-06-11 DIAGNOSIS — N3000 Acute cystitis without hematuria: Secondary | ICD-10-CM

## 2015-06-11 DIAGNOSIS — Z01419 Encounter for gynecological examination (general) (routine) without abnormal findings: Secondary | ICD-10-CM | POA: Diagnosis not present

## 2015-06-11 DIAGNOSIS — Z1272 Encounter for screening for malignant neoplasm of vagina: Secondary | ICD-10-CM | POA: Diagnosis not present

## 2015-06-11 DIAGNOSIS — N952 Postmenopausal atrophic vaginitis: Secondary | ICD-10-CM | POA: Diagnosis not present

## 2015-06-11 LAB — URINALYSIS W MICROSCOPIC + REFLEX CULTURE
BILIRUBIN URINE: NEGATIVE
Casts: NONE SEEN [LPF]
Crystals: NONE SEEN [HPF]
GLUCOSE, UA: NEGATIVE
HGB URINE DIPSTICK: NEGATIVE
KETONES UR: NEGATIVE
Nitrite: NEGATIVE
PROTEIN: NEGATIVE
RBC / HPF: NONE SEEN RBC/HPF (ref <=2)
Specific Gravity, Urine: 1.01 (ref 1.001–1.035)
Yeast: NONE SEEN [HPF]
pH: 5.5 (ref 5.0–8.0)

## 2015-06-11 MED ORDER — CIPROFLOXACIN HCL 250 MG PO TABS: 250.0000 mg | ORAL_TABLET | Freq: Two times a day (BID) | ORAL | Status: DC

## 2015-06-11 MED ORDER — FLUCONAZOLE 150 MG PO TABS: 150.0000 mg | ORAL_TABLET | Freq: Once | ORAL | Status: DC

## 2015-06-11 NOTE — Addendum Note (Signed)
Addended by: Nelva Nay on: 06/11/2015 12:14 PM   Modules accepted: Orders

## 2015-06-11 NOTE — Patient Instructions (Signed)
Take the antibiotics twice daily for 7 days. Follow up if your symptoms persist, worsen or recur.  You may obtain a copy of any labs that were done today by logging onto MyChart as outlined in the instructions provided with your AVS (after visit summary). The office will not call with normal lab results but certainly if there are any significant abnormalities then we will contact you.   Health Maintenance Adopting a healthy lifestyle and getting preventive care can go a long way to promote health and wellness. Talk with your health care provider about what schedule of regular examinations is right for you. This is a good chance for you to check in with your provider about disease prevention and staying healthy. In between checkups, there are plenty of things you can do on your own. Experts have done a lot of research about which lifestyle changes and preventive measures are most likely to keep you healthy. Ask your health care provider for more information. WEIGHT AND DIET  Eat a healthy diet  Be sure to include plenty of vegetables, fruits, low-fat dairy products, and lean protein.  Do not eat a lot of foods high in solid fats, added sugars, or salt.  Get regular exercise. This is one of the most important things you can do for your health.  Most adults should exercise for at least 150 minutes each week. The exercise should increase your heart rate and make you sweat (moderate-intensity exercise).  Most adults should also do strengthening exercises at least twice a week. This is in addition to the moderate-intensity exercise.  Maintain a healthy weight  Body mass index (BMI) is a measurement that can be used to identify possible weight problems. It estimates body fat based on height and weight. Your health care provider can help determine your BMI and help you achieve or maintain a healthy weight.  For females 7 years of age and older:   A BMI below 18.5 is considered underweight.  A  BMI of 18.5 to 24.9 is normal.  A BMI of 25 to 29.9 is considered overweight.  A BMI of 30 and above is considered obese.  Watch levels of cholesterol and blood lipids  You should start having your blood tested for lipids and cholesterol at 69 years of age, then have this test every 5 years.  You may need to have your cholesterol levels checked more often if:  Your lipid or cholesterol levels are high.  You are older than 69 years of age.  You are at high risk for heart disease.  CANCER SCREENING   Lung Cancer  Lung cancer screening is recommended for adults 47-17 years old who are at high risk for lung cancer because of a history of smoking.  A yearly low-dose CT scan of the lungs is recommended for people who:  Currently smoke.  Have quit within the past 15 years.  Have at least a 30-pack-year history of smoking. A pack year is smoking an average of one pack of cigarettes a day for 1 year.  Yearly screening should continue until it has been 15 years since you quit.  Yearly screening should stop if you develop a health problem that would prevent you from having lung cancer treatment.  Breast Cancer  Practice breast self-awareness. This means understanding how your breasts normally appear and feel.  It also means doing regular breast self-exams. Let your health care provider know about any changes, no matter how small.  If you are in your  52s or 54s, you should have a clinical breast exam (CBE) by a health care provider every 1-3 years as part of a regular health exam.  If you are 74 or older, have a CBE every year. Also consider having a breast X-ray (mammogram) every year.  If you have a family history of breast cancer, talk to your health care provider about genetic screening.  If you are at high risk for breast cancer, talk to your health care provider about having an MRI and a mammogram every year.  Breast cancer gene (BRCA) assessment is recommended for women  who have family members with BRCA-related cancers. BRCA-related cancers include:  Breast.  Ovarian.  Tubal.  Peritoneal cancers.  Results of the assessment will determine the need for genetic counseling and BRCA1 and BRCA2 testing. Cervical Cancer Routine pelvic examinations to screen for cervical cancer are no longer recommended for nonpregnant women who are considered low risk for cancer of the pelvic organs (ovaries, uterus, and vagina) and who do not have symptoms. A pelvic examination may be necessary if you have symptoms including those associated with pelvic infections. Ask your health care provider if a screening pelvic exam is right for you.   The Pap test is the screening test for cervical cancer for women who are considered at risk.  If you had a hysterectomy for a problem that was not cancer or a condition that could lead to cancer, then you no longer need Pap tests.  If you are older than 65 years, and you have had normal Pap tests for the past 10 years, you no longer need to have Pap tests.  If you have had past treatment for cervical cancer or a condition that could lead to cancer, you need Pap tests and screening for cancer for at least 20 years after your treatment.  If you no longer get a Pap test, assess your risk factors if they change (such as having a new sexual partner). This can affect whether you should start being screened again.  Some women have medical problems that increase their chance of getting cervical cancer. If this is the case for you, your health care provider may recommend more frequent screening and Pap tests.  The human papillomavirus (HPV) test is another test that may be used for cervical cancer screening. The HPV test looks for the virus that can cause cell changes in the cervix. The cells collected during the Pap test can be tested for HPV.  The HPV test can be used to screen women 15 years of age and older. Getting tested for HPV can extend the  interval between normal Pap tests from three to five years.  An HPV test also should be used to screen women of any age who have unclear Pap test results.  After 69 years of age, women should have HPV testing as often as Pap tests.  Colorectal Cancer  This type of cancer can be detected and often prevented.  Routine colorectal cancer screening usually begins at 69 years of age and continues through 69 years of age.  Your health care provider may recommend screening at an earlier age if you have risk factors for colon cancer.  Your health care provider may also recommend using home test kits to check for hidden blood in the stool.  A small camera at the end of a tube can be used to examine your colon directly (sigmoidoscopy or colonoscopy). This is done to check for the earliest forms of colorectal  cancer.  Routine screening usually begins at age 65.  Direct examination of the colon should be repeated every 5-10 years through 69 years of age. However, you may need to be screened more often if early forms of precancerous polyps or small growths are found. Skin Cancer  Check your skin from head to toe regularly.  Tell your health care provider about any new moles or changes in moles, especially if there is a change in a mole's shape or color.  Also tell your health care provider if you have a mole that is larger than the size of a pencil eraser.  Always use sunscreen. Apply sunscreen liberally and repeatedly throughout the day.  Protect yourself by wearing long sleeves, pants, a wide-brimmed hat, and sunglasses whenever you are outside. HEART DISEASE, DIABETES, AND HIGH BLOOD PRESSURE   Have your blood pressure checked at least every 1-2 years. High blood pressure causes heart disease and increases the risk of stroke.  If you are between 33 years and 20 years old, ask your health care provider if you should take aspirin to prevent strokes.  Have regular diabetes screenings. This  involves taking a blood sample to check your fasting blood sugar level.  If you are at a normal weight and have a low risk for diabetes, have this test once every three years after 69 years of age.  If you are overweight and have a high risk for diabetes, consider being tested at a younger age or more often. PREVENTING INFECTION  Hepatitis B  If you have a higher risk for hepatitis B, you should be screened for this virus. You are considered at high risk for hepatitis B if:  You were born in a country where hepatitis B is common. Ask your health care provider which countries are considered high risk.  Your parents were born in a high-risk country, and you have not been immunized against hepatitis B (hepatitis B vaccine).  You have HIV or AIDS.  You use needles to inject street drugs.  You live with someone who has hepatitis B.  You have had sex with someone who has hepatitis B.  You get hemodialysis treatment.  You take certain medicines for conditions, including cancer, organ transplantation, and autoimmune conditions. Hepatitis C  Blood testing is recommended for:  Everyone born from [birth year] through 1965.  Anyone with known risk factors for hepatitis C. Sexually transmitted infections (STIs)  You should be screened for sexually transmitted infections (STIs) including gonorrhea and chlamydia if:  You are sexually active and are younger than 69 years of age.  You are older than 69 years of age and your health care provider tells you that you are at risk for this type of infection.  Your sexual activity has changed since you were last screened and you are at an increased risk for chlamydia or gonorrhea. Ask your health care provider if you are at risk.  If you do not have HIV, but are at risk, it may be recommended that you take a prescription medicine daily to prevent HIV infection. This is called pre-exposure prophylaxis (PrEP). You are considered at risk if:  You are  sexually active and do not regularly use condoms or know the HIV status of your partner(s).  You take drugs by injection.  You are sexually active with a partner who has HIV. Talk with your health care provider about whether you are at high risk of being infected with HIV. If you choose to begin PrEP, you  should first be tested for HIV. You should then be tested every 3 months for as long as you are taking PrEP.  PREGNANCY   If you are premenopausal and you may become pregnant, ask your health care provider about preconception counseling.  If you may become pregnant, take 400 to 800 micrograms (mcg) of folic acid every day.  If you want to prevent pregnancy, talk to your health care provider about birth control (contraception). OSTEOPOROSIS AND MENOPAUSE   Osteoporosis is a disease in which the bones lose minerals and strength with aging. This can result in serious bone fractures. Your risk for osteoporosis can be identified using a bone density scan.  If you are 51 years of age or older, or if you are at risk for osteoporosis and fractures, ask your health care provider if you should be screened.  Ask your health care provider whether you should take a calcium or vitamin D supplement to lower your risk for osteoporosis.  Menopause may have certain physical symptoms and risks.  Hormone replacement therapy may reduce some of these symptoms and risks. Talk to your health care provider about whether hormone replacement therapy is right for you.  HOME CARE INSTRUCTIONS   Schedule regular health, dental, and eye exams.  Stay current with your immunizations.   Do not use any tobacco products including cigarettes, chewing tobacco, or electronic cigarettes.  If you are pregnant, do not drink alcohol.  If you are breastfeeding, limit how much and how often you drink alcohol.  Limit alcohol intake to no more than 1 drink per day for nonpregnant women. One drink equals 12 ounces of beer, 5  ounces of wine, or 1 ounces of hard liquor.  Do not use street drugs.  Do not share needles.  Ask your health care provider for help if you need support or information about quitting drugs.  Tell your health care provider if you often feel depressed.  Tell your health care provider if you have ever been abused or do not feel safe at home. Document Released: 12/21/2010 Document Revised: 10/22/2013 Document Reviewed: 05/09/2013 [facility] Patient Information 2015 [place], [place]. This information is not intended to replace advice given to you by your health care provider. Make sure you discuss any questions you have with your health care provider.

## 2015-06-11 NOTE — Progress Notes (Signed)
Jamie Ayers January 24, 1946 [id number]        69 y.o.  [id number]  for breast and pelvic exam  Past medical history,surgical history, problem list, medications, allergies, family history and social history were all reviewed and documented as reviewed in the EPIC chart.  ROS:  Performed with pertinent positives and negatives included in the history, assessment and plan.   Additional significant findings :  none   Exam: Jamie Ayers Vitals:   06/11/15 1117  BP: 118/70  Height: '5\' 3"'$  (1.6 m)  Weight: 223 lb (101.152 kg)   General appearance:  Normal affect, orientation and appearance. Skin: Grossly normal HEENT: Without gross lesions.  No cervical or supraclavicular adenopathy. Thyroid normal.  Lungs:  Clear without wheezing, rales or rhonchi Cardiac: RR, without RMG Abdominal:  Soft, nontender, without masses, guarding, rebound, organomegaly or hernia Breasts:  Examined lying and sitting without masses, retractions, discharge or axillary adenopathy. Pelvic:  Ext/BUS/vagina with atrophic changes. Pap smear of cuff done  Adnexa  Without masses or tenderness    Anus and perineum  Normal   Rectovaginal  Normal sphincter tone without palpated masses or tenderness.    Assessment/Plan:  69 y.o. [id number] female for breast and pelvic exam.   1. Frequency and mild dysuria. Recently treated for UTI 05/28/2015. Macrobid 100 mg twice a day 7 days with improvements of her symptoms but not totally gone. Culture ultimately grew out multiple bacteria. Urine analysis today shows 6-10 WBC with few bacteria. Will cover with ciprofloxacin 250 mg twice a day 7 days. Follow up on culture. 2. History of recurrent yeast vaginitis that she treats with Diflucan as needed. No evidence of infection today. Diflucan 150 mg #5 with 1 refill to have available for this coming year. 3. Postmenopausal/atrophic genital changes. 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 Doing well without significant hot flushes  or night sweats. 4. Pap smear 2012. Pap smear done today. Initially discussed stop screening per current screening guidelines but patient relates her aunt was just diagnosed with vaginal cancer at age 19 and she is worried about this. Pap smear done today. 5. Mammography coming due in March and I reminded her to schedule this. SBE monthly reviewed. 6. DEXA 2015 normal. Will repeat at 5 year interval. Increased calcium vitamin D reviewed. 7. Colonoscopy 2012. Repeat at their recommended interval. 8. Health maintenance. No routine blood work done as this is done at her primary physician's office.   Anastasio Auerbach MD, 11:58 AM 06/11/2015

## 2015-06-12 ENCOUNTER — Telehealth: Payer: Self-pay | Admitting: Cardiovascular Disease

## 2015-06-12 LAB — URINE CULTURE
COLONY COUNT: NO GROWTH
Organism ID, Bacteria: NO GROWTH

## 2015-06-12 MED ORDER — DIGOXIN 125 MCG PO TABS: 125.0000 ug | ORAL_TABLET | Freq: Every day | ORAL | Status: DC

## 2015-06-12 NOTE — Telephone Encounter (Signed)
Refill sent to the pharmacy electronically.  

## 2015-06-12 NOTE — Telephone Encounter (Signed)
°*  STAT* If patient is at the pharmacy, call can be transferred to refill team.   1. Which medications need to be refilled? (please list name of each medication and dose if known) Digoxin-please call this in asap- please call before the holiday  2. Which pharmacy/location (including street and city if local pharmacy) is medication to be sent to?[facility] 3. Do they need a 30 day or 90 day supply? 90 and refills

## 2015-06-13 ENCOUNTER — Other Ambulatory Visit: Payer: Self-pay | Admitting: Orthopedic Surgery

## 2015-06-13 DIAGNOSIS — M79605 Pain in left leg: Principal | ICD-10-CM

## 2015-06-13 DIAGNOSIS — M79604 Pain in right leg: Secondary | ICD-10-CM

## 2015-06-13 LAB — CYTOLOGY - PAP

## 2015-06-22 HISTORY — PX: BREAST EXCISIONAL BIOPSY: SUR124

## 2015-07-30 ENCOUNTER — Other Ambulatory Visit: Payer: Self-pay

## 2015-07-30 DIAGNOSIS — Z1231 Encounter for screening mammogram for malignant neoplasm of breast: Secondary | ICD-10-CM

## 2015-08-04 ENCOUNTER — Telehealth: Payer: Self-pay | Admitting: Cardiovascular Disease

## 2015-08-04 MED ORDER — FUROSEMIDE 40 MG PO TABS
40.0000 mg | ORAL_TABLET | Freq: Every day | ORAL | Status: DC
Start: 2015-08-04 — End: 2016-03-31

## 2015-08-04 NOTE — Telephone Encounter (Signed)
New message       *STAT* If patient is at the pharmacy, call can be transferred to refill team.   1. Which medications need to be refilled? (please list name of each medication and dose if known) furosemide  '40mg'$   2. Which pharmacy/location (including street and city if local pharmacy) is medication to be sent to?  [facility] 3. Do they need a 30 day or 90 day supply? 90 day supply

## 2015-08-04 NOTE — Telephone Encounter (Signed)
Refills sent

## 2015-08-13 IMAGING — MG MM SCREENING BREAST TOMO BILATERAL
9 of 12 series · 9 of 28 positions shown · non-contrast
Comparison: Previous exam(s).

CLINICAL DATA: Screening.

EXAM:
DIGITAL SCREENING BILATERAL MAMMOGRAM WITH 3D TOMO WITH CAD

[R MLO]
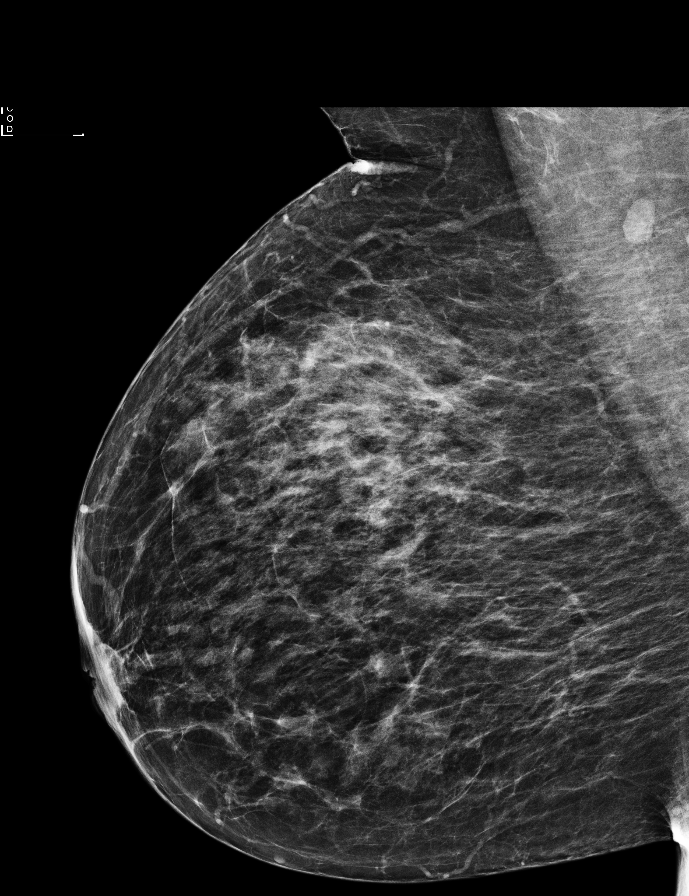

[R CC synth-2D]
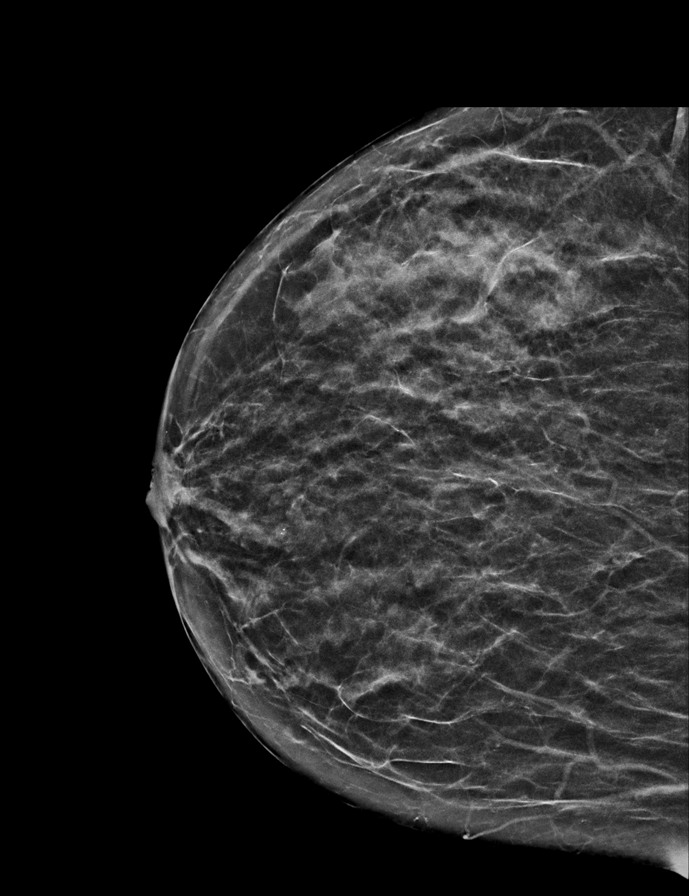

[R CC]
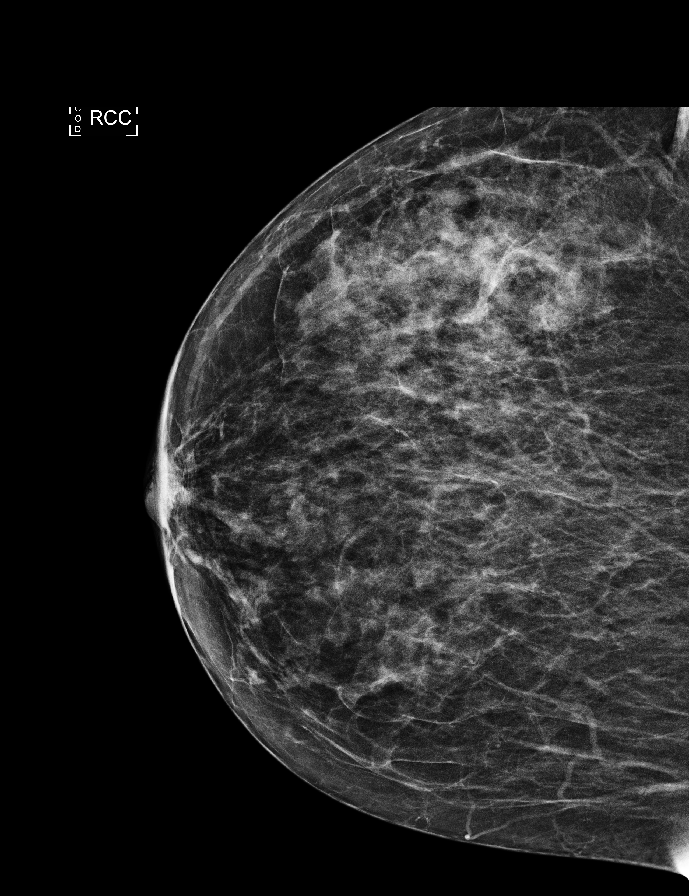

[L CC]
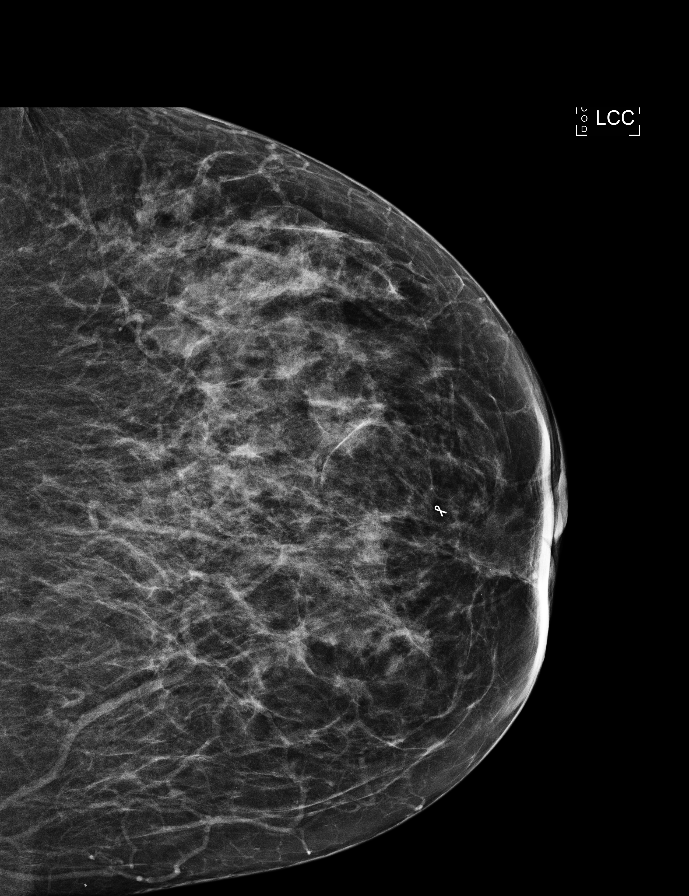

[L MLO synth-2D]
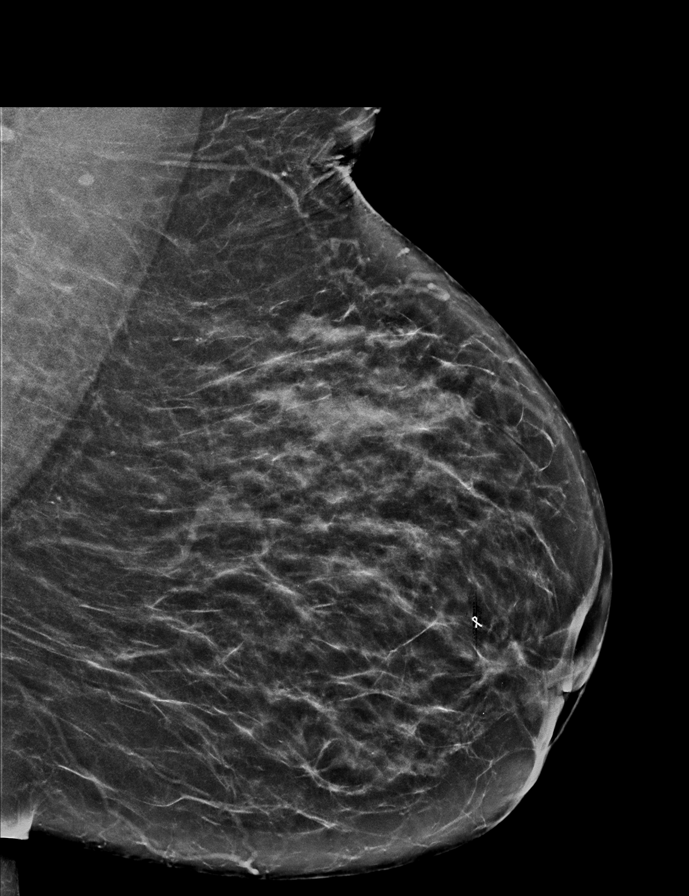

[L MLO]
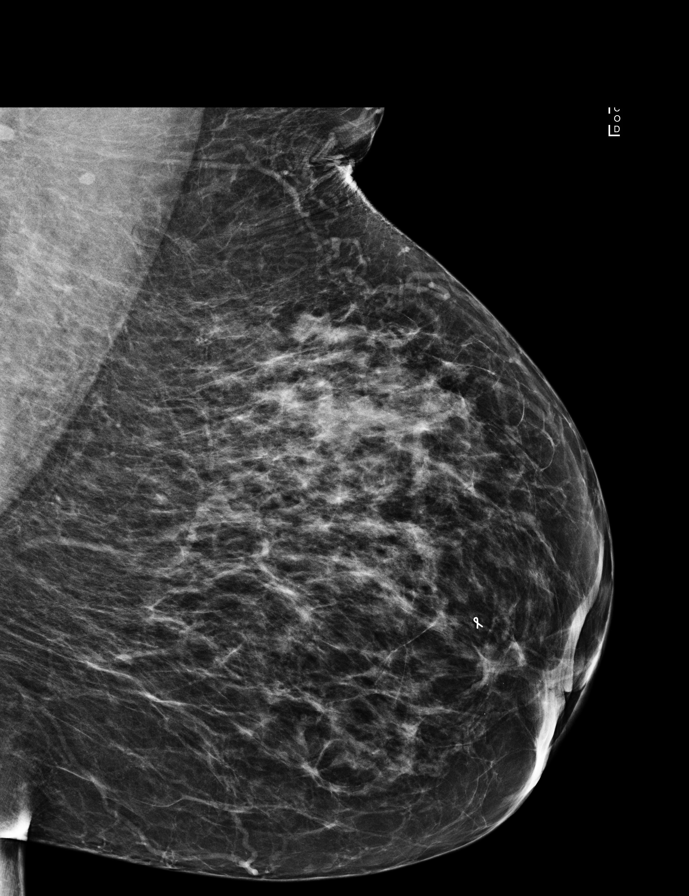

[L CC synth-2D]
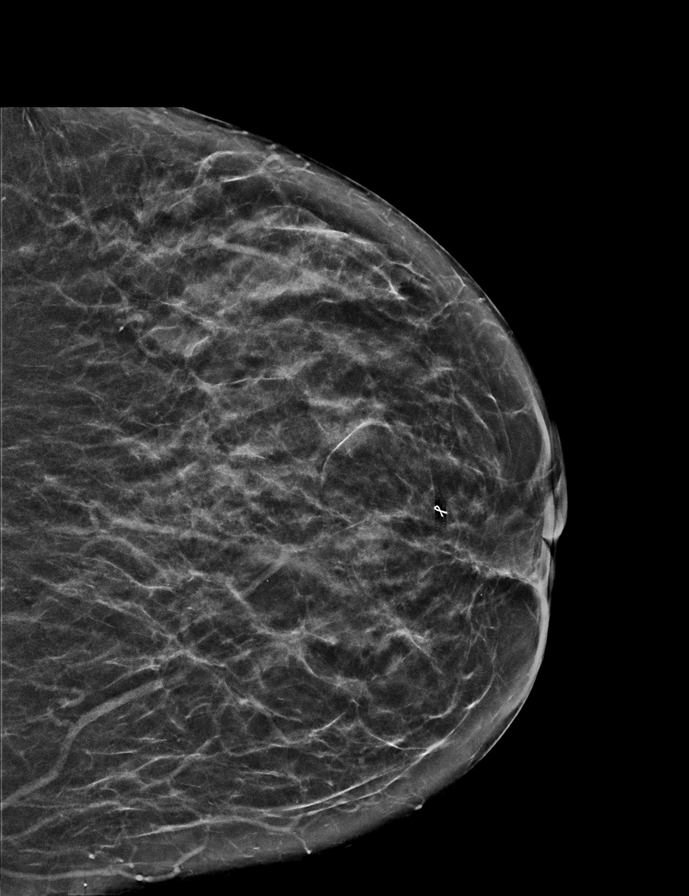

[R MLO synth-2D]
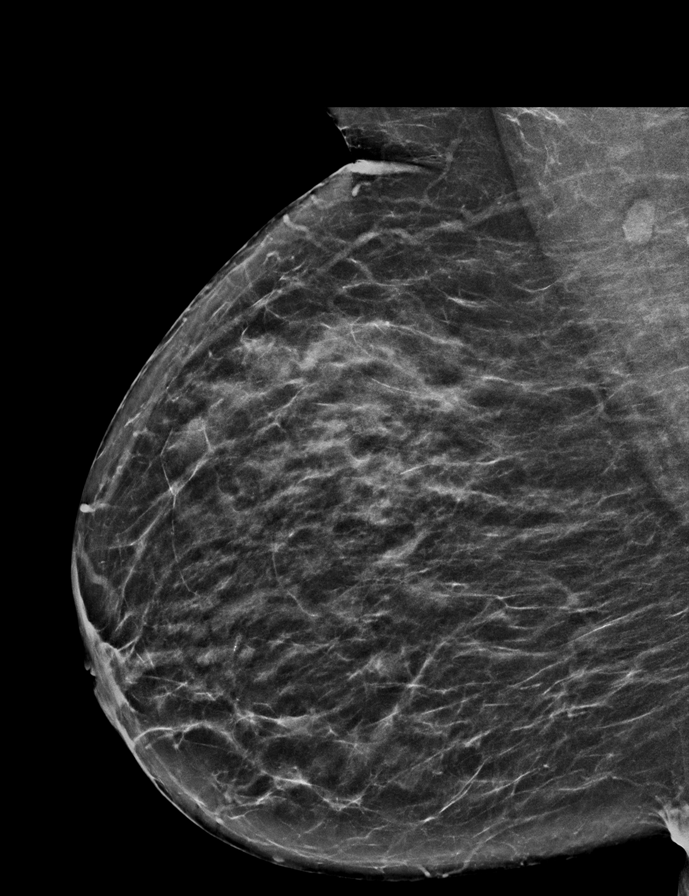

[R CC tomo · tomo slice 25/49.0]
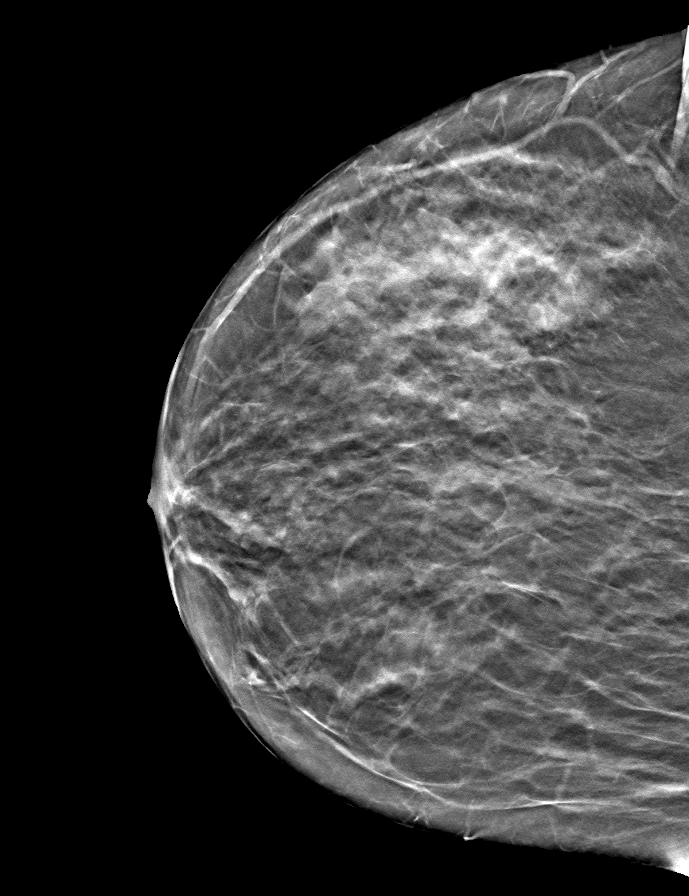

[9 of 28 positions shown; findings below may reference images not displayed]

ACR Breast Density Category c: The breast tissue is heterogeneously
dense, which may obscure small masses.
FINDINGS: There are no findings suspicious for malignancy. Images were
processed with CAD.
IMPRESSION: No mammographic evidence of malignancy. A result letter of this
screening mammogram will be mailed directly to the patient.

RECOMMENDATION:
Screening mammogram in one year. (Code:[id number])

BI-RADS CATEGORY  1: Negative.

## 2015-09-01 ENCOUNTER — Ambulatory Visit
Admission: RE | Admit: 2015-09-01 | Discharge: 2015-09-01 | Disposition: A | Discharge status: Home / Self Care | Payer: Medicare Other | Source: Ambulatory Visit

## 2015-09-01 DIAGNOSIS — Z1231 Encounter for screening mammogram for malignant neoplasm of breast: Secondary | ICD-10-CM

## 2015-09-03 ENCOUNTER — Telehealth: Payer: Self-pay | Admitting: Cardiovascular Disease

## 2015-09-03 NOTE — Telephone Encounter (Signed)
Notices over the past week she feels sluggish, has had SON and fatigue which she thought to be related to acute illness. Notes viral illness ~3 weeks ago but checked by PCP - blood panel OK.  She reports some swelling in legs - she is unsure if SOB worse w/ position.  SOB is intermittent & she has noticed off &on for about a week. Pt denies CP.   Advised to increase furosemide to '40mg'$  BID for next 3 days & see if improvement.  Affirmed pt's wish to be seen for OV. Informed her Dr. Claiborne Billings had no appts - offered a flex PA visit. Pt requested to be seen by physician only. I discussed w/ [facility] Daniel - next available opening is for Friday w/ Dr. Percival Spanish - pt added to schedule.  Advised if worse SOB or if she has chest pain to go to emergency room. Pt voiced understanding & thanks, acknowledged appt info.  Routed to Dr. Claiborne Billings, Dr. Percival Spanish.

## 2015-09-03 NOTE — Telephone Encounter (Signed)
New Message  Pt c/o Shortness Of Breath: STAT if SOB developed within the last 24 hours or pt is noticeably SOB on the phone  1. Are you currently SOB (can you hear that pt is SOB on the phone)? No  2. How long have you been experiencing SOB? 3 days. She believed it was something else 3. Are you SOB when sitting or when up moving around? Comes and goes  4.  Are you currently experiencing any other symptoms? Chest pain   Pt c/o of Chest Pain: 1. Are you having CP right now? No  2. Are you experiencing any other symptoms (ex. SOB, nausea, vomiting, sweating)? No  3. How long have you been experiencing CP? 3 days. She believed it was something else 4. Is your CP continuous or coming and going? Comes and goes  5. Have you taken Nitroglycerin? Doesn't take them

## 2015-09-04 ENCOUNTER — Encounter: Payer: Self-pay | Admitting: Cardiology

## 2015-09-04 NOTE — Progress Notes (Signed)
Cardiology Office Note   Date:  09/05/2015   ID:  Jamie Ayers, DOB Jan 05, 1946, MRN [id number]  PCP:  [name], MD  Cardiologist:   Minus Breeding, MD   Chief Complaint  Patient presents with  . Fatigue      History of Present Illness: Jamie Ayers is a 70 y.o. female who presents for evaluation of fatigue. She also was having some mild shortness of breath with some leg edema. She's actually been taking a slightly higher dose of diuretic. She saw her primary providers apparently for or blood work and I did review this today. She was not anemic. She has some mild renal insufficiency. There were no other acute abnormalities. She says she's been tired for 3 weeks. She's also not been sleeping. She only sleeps 3 or 4 hours a night area she just can't fall asleep despite at times taking Ambien. This is been a problem only for about a month or so. She's not describing chest pressure, neck or arm discomfort. She's not describing palpitations, presyncope or syncope. She's not having any PND or orthopnea.  She was added to my schedule to discuss this.  She is followed by Dr. Claiborne Billings  Past Medical History  Diagnosis Date  . Anemia   . Disorder of vocal cord   . Hernia   . CHF (congestive heart failure) (HCC)     HeFPEF  . GERD (gastroesophageal reflux disease)   . Thyroid disease   . Upper respiratory infection   . Asthmatic bronchitis   . GERD (gastroesophageal reflux disease)   . Anxiety disorder   . OSA on CPAP 03/28/07    sleep study-[facility]-AHI 9.42/HR and during REM sleep '@37'$ .50/hr. The average 02 sat duringREM AND NREM was 97.0%  . Benign essential HTN 08/06/11    ECHO-EF>55%  . Hyperlipidemia 03/14/12    Lexiscan myoview-WNL; unchanged from previous study.    Past Surgical History  Procedure Laterality Date  . Vocal cord surg    . Hysteroscopy    . Endometrial biopsy    . Dilation and curettage of uterus    . Nissen fundoplication    .  Parathyroid surg    . Hernia repair    . Abdominal hysterectomy  2013    TAH BSO  . Oophorectomy      BSO  . Breast surgery      Papilloma  . Knee surgery      arthroscopic     Current Outpatient Prescriptions  Medication Sig Dispense Refill  . aspirin 81 MG tablet Take 162 mg by mouth at bedtime.     Marland Kitchen atorvastatin (LIPITOR) 40 MG tablet Take 0.5 tablets (20 mg total) by mouth daily. 90 tablet 3  . Calcium Carbonate-Vitamin D (CALCIUM + D PO) Take 1 tablet by mouth daily.     . carvedilol (COREG CR) 40 MG 24 hr capsule Take 1 capsule (40 mg total) by mouth every evening. 90 capsule 3  . Dextromethorphan-Guaifenesin (MUCINEX DM MAXIMUM STRENGTH) 60-1200 MG TB12 Take 1 tablet by mouth daily.    . digoxin (LANOXIN) 0.125 MG tablet Take 1 tablet (125 mcg total) by mouth daily. 90 tablet 3  . esomeprazole (NEXIUM) 40 MG capsule Take 1 capsule (40 mg total) by mouth 2 (two) times daily before a meal. 180 capsule 3  . fluconazole (DIFLUCAN) 150 MG tablet Take 1 tablet (150 mg total) by mouth once. As needed for yeast 5 tablet 0  .  furosemide (LASIX) 40 MG tablet Take 1 tablet (40 mg total) by mouth daily. 90 tablet 1  . montelukast (SINGULAIR) 10 MG tablet Take 10 mg by mouth at bedtime.    . Multiple Vitamin (MULTIVITAMIN) tablet Take 1 tablet by mouth daily.      . NON FORMULARY CPAP THERAPY    . NONFORMULARY OR COMPOUNDED ITEM Boric acid vaginal suppositories 600 mg one per vagina as needed for yeast 30 each 2  . sitaGLIPtin (JANUVIA) 100 MG tablet Take 50 mg by mouth daily.    Marland Kitchen spironolactone (ALDACTONE) 25 MG tablet Take 0.5 tablets (12.5 mg total) by mouth daily. 45 tablet 3  . valsartan (DIOVAN) 160 MG tablet Take 1 tablet (160 mg total) by mouth 2 (two) times daily. 180 tablet 3  . zolpidem (AMBIEN) 10 MG tablet Take 10 mg by mouth at bedtime as needed. For sleep    . albuterol (PROVENTIL HFA;VENTOLIN HFA) 108 (90 BASE) MCG/ACT inhaler Inhale 2 puffs into the lungs every 4 (four)  hours as needed for wheezing. 3.7 g 0   No current facility-administered medications for this visit.    Allergies:   Midazolam; Midazolam hcl; Codeine; Morphine and related; Paba derivatives; Sulfonamide derivatives; and Sulfamethoxazole    ROS:  Please see the history of present illness.   Otherwise, review of systems are positive for none.   All other systems are reviewed and negative.    PHYSICAL EXAM: VS:  BP 120/80 mmHg  Pulse 64  Ht 5' 1.5" (1.562 m)  Wt 213 lb 14.4 oz (97.024 kg)  BMI 39.77 kg/m2 , BMI Body mass index is 39.77 kg/(m^2). GENERAL:  Well appearing NECK:  No jugular venous distention, waveform within normal limits, carotid upstroke brisk and symmetric, no bruits, no thyromegaly LYMPHATICS:  No cervical, inguinal adenopathy LUNGS:  Clear to auscultation bilaterally BACK:  No CVA tenderness CHEST:  Unremarkable HEART:  PMI not displaced or sustained,S1 and S2 within normal limits, no S3, no S4, no clicks, no rubs, no murmurs ABD:  Flat, positive bowel sounds normal in frequency in pitch, no bruits, no rebound, no guarding, no midline pulsatile mass, no hepatomegaly, no splenomegaly EXT:  2 plus pulses throughout, trace edema, no cyanosis no clubbing    EKG:  EKG is ordered today. The ekg ordered today demonstrates sinus rhythm, rate 64, axis within normal limits, intervals within normal limits, no acute ST-T wave changes   Recent Labs: No results found for requested labs within last 365 days.    Lipid Panel    Component Value Date/Time   CHOL 190 10/08/2013 0953   TRIG 118 10/08/2013 0953   HDL 61 10/08/2013 0953   CHOLHDL 3.1 10/08/2013 0953   VLDL 24 10/08/2013 0953   LDLCALC 105* 10/08/2013 0953      Wt Readings from Last 3 Encounters:  09/05/15 213 lb 14.4 oz (97.024 kg)  06/11/15 223 lb (101.152 kg)  04/14/15 221 lb 11.2 oz (100.562 kg)      Other studies Reviewed: Additional studies/ records that were reviewed today include: Office  labs, echo and [employer] 2016. Review of the above records demonstrates:  Please see elsewhere in the note.     ASSESSMENT AND PLAN:  DYSPNEA:  I will check a BNP level. She's had a negative perfusion study in a normal ejection fraction and essentially unremarkable echo except for some mild diastolic dysfunction just last year. We talked about salt and fluid restriction. No further workup will be suggested at  this time unless the BNP level is markedly elevated.  FATIGUE:  She has significant insomnia. I've asked her to discuss this with [name], MD.  She also will have follow-up of her sleep apnea with Dr. Claiborne Billings. It is difficult to ascribe another etiology to her fatigue until she has her insomnia treated.   Current medicines are reviewed at length with the patient today.  The patient does not have concerns regarding medicines.  The following changes have been made:  no change  Labs/ tests ordered today include:   Orders Placed This Encounter  Procedures  . B Nat Peptide     Disposition:   FU with Dr. Claiborne Billings    Signed, Minus Breeding, MD  09/05/2015 10:54 AM    [place]

## 2015-09-05 ENCOUNTER — Ambulatory Visit (INDEPENDENT_AMBULATORY_CARE_PROVIDER_SITE_OTHER): Payer: Medicare Other | Admitting: Cardiology

## 2015-09-05 ENCOUNTER — Encounter: Payer: Self-pay | Admitting: Cardiology

## 2015-09-05 ENCOUNTER — Other Ambulatory Visit: Payer: Self-pay | Admitting: *Deleted

## 2015-09-05 VITALS — BP 120/80 | HR 64 | Ht 61.5 in | Wt 213.9 lb

## 2015-09-05 DIAGNOSIS — G4733 Obstructive sleep apnea (adult) (pediatric): Secondary | ICD-10-CM

## 2015-09-05 DIAGNOSIS — R0602 Shortness of breath: Secondary | ICD-10-CM | POA: Diagnosis not present

## 2015-09-05 DIAGNOSIS — Z9989 Dependence on other enabling machines and devices: Principal | ICD-10-CM

## 2015-09-05 LAB — BRAIN NATRIURETIC PEPTIDE: BRAIN NATRIURETIC PEPTIDE: 12.7 pg/mL (ref <100)

## 2015-09-05 NOTE — Patient Instructions (Signed)
Your physician recommends that you schedule a follow-up appointment in: 2 Months with Dr Claiborne Billings  Your physician recommends that you return for lab work in: BNP today

## 2015-09-08 NOTE — Telephone Encounter (Signed)
If pt was seen ok to have f/u with me; otherwise can work in as an add-on

## 2015-09-08 NOTE — Telephone Encounter (Signed)
Pt saw Dr. Percival Spanish Friday, she returns for routine OV w/ Dr. Claiborne Billings in May.

## 2015-09-17 NOTE — Addendum Note (Signed)
Addended by: Therisa Doyne on: 09/17/2015 04:55 PM   Modules accepted: Orders

## 2015-09-18 ENCOUNTER — Ambulatory Visit
Admission: RE | Admit: 2015-09-18 | Discharge: 2015-09-18 | Disposition: A | Discharge status: Home / Self Care | Payer: Medicare Other | Source: Ambulatory Visit | Attending: Orthopedic Surgery | Admitting: Orthopedic Surgery

## 2015-09-18 ENCOUNTER — Other Ambulatory Visit: Payer: Medicare Other

## 2015-09-18 DIAGNOSIS — M79605 Pain in left leg: Principal | ICD-10-CM

## 2015-09-18 DIAGNOSIS — M79604 Pain in right leg: Secondary | ICD-10-CM

## 2015-10-10 ENCOUNTER — Ambulatory Visit (INDEPENDENT_AMBULATORY_CARE_PROVIDER_SITE_OTHER): Payer: Medicare Other | Admitting: Cardiovascular Disease

## 2015-10-10 ENCOUNTER — Encounter: Payer: Self-pay | Admitting: Cardiovascular Disease

## 2015-10-10 VITALS — BP 110/64 | HR 64 | Ht 61.5 in | Wt 216.0 lb

## 2015-10-10 DIAGNOSIS — Z79899 Other long term (current) drug therapy: Secondary | ICD-10-CM | POA: Diagnosis not present

## 2015-10-10 DIAGNOSIS — I10 Essential (primary) hypertension: Secondary | ICD-10-CM

## 2015-10-10 DIAGNOSIS — Z9989 Dependence on other enabling machines and devices: Secondary | ICD-10-CM

## 2015-10-10 DIAGNOSIS — R899 Unspecified abnormal finding in specimens from other organs, systems and tissues: Secondary | ICD-10-CM

## 2015-10-10 DIAGNOSIS — E785 Hyperlipidemia, unspecified: Secondary | ICD-10-CM

## 2015-10-10 DIAGNOSIS — D509 Iron deficiency anemia, unspecified: Secondary | ICD-10-CM

## 2015-10-10 DIAGNOSIS — G4730 Sleep apnea, unspecified: Secondary | ICD-10-CM | POA: Diagnosis not present

## 2015-10-10 DIAGNOSIS — G4733 Obstructive sleep apnea (adult) (pediatric): Secondary | ICD-10-CM

## 2015-10-10 NOTE — Patient Instructions (Signed)
Your physician has recommended you make the following change in your medication: STOP spironalactone.  Increase fish oil to 2 tablets daily. Get some Co Q10 200 mg over the counter to take for the muscle discomfort.   Your physician recommends that you return for lab work.  Your physician wants you to follow-up in: 6 months or sooner if needed. You will receive a reminder letter in the mail two months in advance. If you don't receive a letter, please call our office to schedule the follow-up appointment.   If you need a refill on your cardiac medications before your next appointment, please call your pharmacy.

## 2015-10-12 ENCOUNTER — Encounter: Payer: Self-pay | Admitting: Cardiovascular Disease

## 2015-10-12 NOTE — Progress Notes (Signed)
Patient ID: Jamie Ayers, female   DOB: Mar 05, 1946, 70 y.o.   MRN: [id number]     Primary  M.D.: Dr. Kevan Ny  HPI: Jamie Ayers is a 70 y.o. female who presents to the office for a 6 month cardiology evaluation.  She had recently been seen by Dr. Percival Spanish with complaints of fatigue.  Ms. Ayers has a remote history of a nonischemic cardiomyopathy.  In December 2005 her ejection fraction was 20%. With aggressive medical therapy LV function normalized to approximately 55%. She has a history of obstructive sleep apnea and has been utilizing CPAP therapy since 2008. 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 She also has undergone papilloma from a breast duct removal at the time of the hysterectomy.  An echo Doppler study on 03/20/2013 showed an ejection fraction at 50-55% with mild concentric left ventricular hypertrophy. She had normal diastolic function. There was evidence for mild aortic insufficiency and mild mitral regurg patient with a centrally directed jet. Her left atrium was mildly dilated. She recently had repeat laboratory done by Dr. Kevan Ny.  Laboratory on 10/05/2013 revealed hemoglobin11.6/ hematocrit 36.0.  She did have microcytic indices at 73.5.  She denies any history of sickle cell trait or thalassemia.  She is unaware of blood loss.  Glucose was elevated at 147.  She tells me in the past Dr. Marlou Sa had tried metformin but she did not tolerate this.  TSH was 1.054.  She is continuing to use CPAP with 100% compliance.  She denies daytime fatigue or awareness of breakthrough snoring.  I last saw her, she denied recent chest tightness, PND, or orthopnea.  She has been trying to lose weight and admits to a 12 pound 1 loss over the past 3 months.  She is status post recent steroid injections in her knees, which has resulted in glucose elevation.  She had blood work done by Dr. Marlou Sa in  August.  On 01/28/2015 a follow-up echo Doppler studyrevealed an ejection fraction at 60-65% without wall motion abnormalities.  There was grade 1 diastolic dysfunction, mild aortic and mild mitral regurgitation.  The left atrium was mildly dilated.  Recently, she has noticed some mild myalgias in her legs.    She was seen as an add-on last month by Dr. Percival Spanish with complaints of fatigue and some shortness of breath.  BNP was normal at 12.7.  Blood work by Dr. Marlou Sa from 03/02/2017revealed  a TSH of 0.65.  She had microcytic indices with an MCV of 73, hemoglobin 12.4.  Her creatinine had risen to 1.58 with a BUN of 44.  Calcium was mildly elevated at 10.9.  She continues to use CPAP therapy.  I was able to review a download from 08/13/2015 through 09/11/2015.  She has S9.  AutoSet CPAP ResMed unit, 95th percentile pressure is 15.5.  AHI is excellent at 0.5.  She is compliant with 97% of usage stays averaging approximately 8 hours sleep per night.  He presents for evaluation  Past Medical History  Diagnosis Date  . Anemia   . Disorder of vocal cord   . Hernia   . CHF (congestive heart failure) (HCC)     HeFPEF  . GERD (gastroesophageal reflux disease)   . Thyroid disease   . Upper respiratory infection   . Asthmatic bronchitis   . GERD (gastroesophageal reflux disease)   . Anxiety disorder   . OSA on  CPAP 03/28/07    sleep study-[facility]-AHI 9.42/HR and during REM sleep @37 .50/hr. The average 02 sat duringREM AND NREM was 97.0%  . Benign essential HTN 08/06/11    ECHO-EF>55%  . Hyperlipidemia 03/14/12    Lexiscan myoview-WNL; unchanged from previous study.    Past Surgical History  Procedure Laterality Date  . Vocal cord surg    . Hysteroscopy    . Endometrial biopsy    . Dilation and curettage of uterus    . Nissen fundoplication    . Parathyroid surg    . Hernia repair    . Abdominal hysterectomy  2013    TAH BSO  . Oophorectomy      BSO  . Breast  surgery      Papilloma  . Knee surgery      arthroscopic    Allergies  Allergen Reactions  . Midazolam Other (See Comments)    REACTION: cardiac arrest  . Midazolam Hcl     Cardiac arrest    VERSED  . Codeine Hives and Itching  . Morphine And Related Hives  . Paba Derivatives Nausea Only    PT REPORTS SEVERE NAUSEA AFTER SURGERY, DENIES VOMITING  . Sulfonamide Derivatives Hives and Itching  . Sulfamethoxazole Hives    Current Outpatient Prescriptions  Medication Sig Dispense Refill  . aspirin 81 MG tablet Take 162 mg by mouth at bedtime.     Marland Kitchen atorvastatin (LIPITOR) 40 MG tablet Take 0.5 tablets (20 mg total) by mouth daily. 90 tablet 3  . Calcium Carbonate-Vitamin D (CALCIUM + D PO) Take 1 tablet by mouth daily.     . carvedilol (COREG CR) 40 MG 24 hr capsule Take 1 capsule (40 mg total) by mouth every evening. 90 capsule 3  . Dextromethorphan-Guaifenesin (MUCINEX DM MAXIMUM STRENGTH) 60-1200 MG TB12 Take 1 tablet by mouth daily.    . digoxin (LANOXIN) 0.125 MG tablet Take 1 tablet (125 mcg total) by mouth daily. 90 tablet 3  . esomeprazole (NEXIUM) 40 MG capsule Take 1 capsule (40 mg total) by mouth 2 (two) times daily before a meal. 180 capsule 3  . fluconazole (DIFLUCAN) 150 MG tablet Take 1 tablet (150 mg total) by mouth once. As needed for yeast 5 tablet 0  . furosemide (LASIX) 40 MG tablet Take 1 tablet (40 mg total) by mouth daily. 90 tablet 1  . montelukast (SINGULAIR) 10 MG tablet Take 10 mg by mouth at bedtime.    . Multiple Vitamin (MULTIVITAMIN) tablet Take 1 tablet by mouth daily.      . NON FORMULARY CPAP THERAPY    . NONFORMULARY OR COMPOUNDED ITEM Boric acid vaginal suppositories 600 mg one per vagina as needed for yeast 30 each 2  . sitaGLIPtin (JANUVIA) 100 MG tablet Take 50 mg by mouth daily.    . valsartan (DIOVAN) 160 MG tablet Take 1 tablet (160 mg total) by mouth 2 (two) times daily. 180 tablet 3  . zolpidem (AMBIEN) 10 MG tablet Take 10 mg by mouth at  bedtime as needed. For sleep    . albuterol (PROVENTIL HFA;VENTOLIN HFA) 108 (90 BASE) MCG/ACT inhaler Inhale 2 puffs into the lungs every 4 (four) hours as needed for wheezing. 3.7 g 0   No current facility-administered medications for this visit.    Social History   Social History  . Marital Status: Married    Spouse Name: N/A  . Number of Children: N/A  . Years of Education: N/A  Occupational History  . Not on file.   Social History Main Topics  . Smoking status: Never Smoker   . Smokeless tobacco: Not on file  . Alcohol Use: No  . Drug Use: No  . Sexual Activity: Yes    Birth Control/ Protection: Post-menopausal, Surgical     Comment: HYST-1st intercourse 70 yo-Fewer than 5 partners   Other Topics Concern  . Not on file   Social History Narrative    Family History  Problem Relation Age of Onset  . Diabetes Father   . Hypertension Father   . Cancer Father     Prostate cancer  . Arthritis Father   . Gout Father   . Hypertension Mother   . Stroke Mother   . Breast cancer Maternal Grandmother     Age 31's  . Ovarian cancer Paternal Grandmother   . Breast cancer Sister     Age 68  . Kidney disease Sister    Social history is notable in that she is married has 4 children 9 grandchildren. There is no tobacco or alcohol use.  ROS General: Negative; No fevers, chills, or night sweats;  HEENT: Occasional hoarseness.  In the past she has had difficulty with vocal cord papillomas. No changes in vision or hearing, sinus congestion, difficulty swallowing Pulmonary: Negative; No cough, wheezing, shortness of breath, hemoptysis Cardiovascular: Negative; No chest pain, presyncope, syncope, palpitations GI: Negative; No nausea, vomiting, diarrhea, or abdominal pain GU: Negative; No dysuria, hematuria, or difficulty voiding Musculoskeletal: Positive for mild myalgias; no joint pain, or weakness Hematologic/Oncology: Negative; no easy bruising, bleeding Endocrine:  Positive for diabetes mellitus.  No cold or heat intolerance. Neuro: Negative; no changes in balance, headaches Skin: Negative; No rashes or skin lesions Psychiatric: Negative; No behavioral problems, depression Sleep: Positive for sleep apnea, for which she uses CPAP every night.  No residual snoring, daytime sleepiness, hypersomnolence, bruxism, restless legs, hypnogognic hallucinations, no cataplexy Other comprehensive 14 point system review is negative.   PE BP 110/64 mmHg  Pulse 64  Ht 5' 1.5" (1.562 m)  Wt 216 lb (97.977 kg)  BMI 40.16 kg/m2   Wt Readings from Last 3 Encounters:  10/10/15 216 lb (97.977 kg)  09/05/15 213 lb 14.4 oz (97.024 kg)  06/11/15 223 lb (101.152 kg)   General: Alert, oriented, no distress.  Skin: normal turgor, no rashes HEENT: Normocephalic, atraumatic. Pupils round and reactive; sclera anicteric;no lid lag.  Nose without nasal septal hypertrophy Mouth/Parynx benign; Mallinpatti scale 3 Neck: Thick neck No JVD, no carotid bruits with normal carolti upstroke Lungs: clear to ausculatation and percussion; no wheezing or rales Chest wall: Nontender to palpation Heart: RRR, s1 s2 normal 2/6 systolic murmur; no diastolic murmur, rubs, thrills or heaves Abdomen: Significant central or possibly soft, nontender; no hepatosplenomehaly, BS+; abdominal aorta nontender and not dilated by palpation. Back: No CVA tenderness Pulses 2+ Extremities: Trace ankle swelling; no clubbing cyanosis, Homan's sign negative  Neurologic: grossly nonfocal Psychologic: Normal affect and mood  ECG (independently read by me): Normal sinus rhythm at 64 bpm.  First-degree AV block with a PR interval of 218 ms.  ECG (independently read by me): Normal sinus rhythm at 68 bpm.  LVH by voltage in aVL.  July 2016 ECG (independently read by me): Normal sinus rhythm at 64 bpm..  Borderline LVH.  No cigarette ST changes.  August 2015 ECG (independently read by me): Normal sinus rhythm at  67 beats per minute.  Moderate voltage criteria for LVH.  No  significant ST changes.  Borderline first degree AV block with a PR interval at 208 ms.  10/08/2013 ECG (independently read by me): Sinus rhythm at 59 beats per minute.  Borderline first-degree block with PR interval of 220 ms.  QTc interval normal.  Prior 04/17/2013 ECG: Normal sinus rhythm at 62 beats per minute. Borderline first degree AV block is no longer present with her PR interval now at 184 ms.  LABS: BMP Latest Ref Rng 10/05/2013 02/05/2013 02/18/2012  Glucose 70 - 99 mg/dL 147(H) 144(H) 117(H)  BUN 6 - 23 mg/dL 17 24(H) 26(H)  Creatinine 0.50 - 1.10 mg/dL 0.95 0.97 1.01  Sodium 135 - 145 mEq/L 141 140 140  Potassium 3.5 - 5.3 mEq/L 4.0 4.2 3.9  Chloride 96 - 112 mEq/L 102 100 100  CO2 19 - 32 mEq/L 29 32 29  Calcium 8.4 - 10.5 mg/dL 9.6 10.3 10.4    Hepatic Function Latest Ref Rng 10/05/2013 02/05/2013  Total Protein 6.0 - 8.3 g/dL 6.9 6.9  Albumin 3.5 - 5.2 g/dL 4.1 4.5  AST 0 - 37 U/L 14 20  ALT 0 - 35 U/L 20 41(H)  Alk Phosphatase 39 - 117 U/L 86 82  Total Bilirubin 0.2 - 1.2 mg/dL 0.8 1.0   CBC Latest Ref Rng 10/05/2013 02/05/2013 02/18/2012  WBC 4.0 - 10.5 K/uL 7.4 8.1 6.6  Hemoglobin 12.0 - 15.0 g/dL 11.6(L) 12.1 11.3(L)  Hematocrit 36.0 - 46.0 % 36.0 38.3 35.2(L)  Platelets 150 - 400 K/uL 241 247 166   Lab Results  Component Value Date   MCV 73.5* 10/05/2013   MCV 76.1* 02/05/2013   MCV 72.6* 02/18/2012   Lab Results  Component Value Date   TSH 1.054 10/05/2013   Lab Results  Component Value Date   HGBA1C 8.4* 10/08/2013   Lipid Panel     Component Value Date/Time   CHOL 190 10/08/2013 0953   TRIG 118 10/08/2013 0953   HDL 61 10/08/2013 0953   CHOLHDL 3.1 10/08/2013 0953   VLDL 24 10/08/2013 0953   LDLCALC 105* 10/08/2013 0953    RADIOLOGY: No results found.    ASSESSMENT AND PLAN: Ms. Persichetti is a 70 year old African-American female who has a history of morbid obesity and a history  of a nonischemic cardiomyopathy with an ejection fraction of 20% which subsequently has normalized.  Her last  echo Doppler study in August 2016 continue to show normal systolic function and there was grade 1 diastolic dysfunction.  There was mild AR and mild MR.  Her left atrium was mildly dilated..  A nuclear perfusion study in September 2013 showed normal perfusion without scar or ischemia.  She has severe sleep apnea and continues to utilize her CPAP therapy.  I reviewed her most recent download an AHI is excellent at 0.5.  She continues to be very compliant with CPAP use.  I reviewed recent blood work by Dr. Marlou Sa March 2017.  I last saw her she was complaining of myalgias.  I reduced her atorvastatin and she is now taking this only 20 mg.  On recent blood work by Dr. Marlou Sa.  She did have renal insufficiency and her creatinine had risen to 1.58.  I'm recommending discontinuance of spironolactone.  She will continue with her present dose of valsartan, furosemide and carvedilol.  It may be possible  to discontinue digoxin, but I will not do this presently.  I did have recommended coenzyme Q10 which may help with some of her myalgias from statin therapy.  A recent lipid studies by Dr. Marlou Sa.  Her total cholesterol was 195, triglycerides 239, VLDL 48 and LDL cholesterol 104.  I recommended fish oil 2 capsules twice a day.  She will undergo a follow-up bmet in 2 weeks to see if there is improvement in renal function.  I will see her in 6 months for  reevaluation or sooner if problems arise.  Time spent: 25 minutes  Troy Sine, MD, [facility]  10/12/2015 9:46 AM

## 2015-11-07 ENCOUNTER — Ambulatory Visit: Payer: Medicare Other | Admitting: Cardiovascular Disease

## 2015-12-24 ENCOUNTER — Other Ambulatory Visit: Payer: Self-pay

## 2015-12-24 MED ORDER — FLUCONAZOLE 150 MG PO TABS: 150.0000 mg | ORAL_TABLET | Freq: Once | ORAL | Status: DC

## 2015-12-24 NOTE — Telephone Encounter (Signed)
Patient said she had a topical anti-fungal cream that she applies sometimes when just a little itching outside. She does not know name of it and I do not see where you have prescribed that.  She said she would like you to prescribe it for her since you are gyn.  She also wants 90 day Rx to mail order.

## 2015-12-24 NOTE — Telephone Encounter (Signed)
Patient said she gets 90 day supply for mail order. Rx above was what you sent to mail order last time for 90 days.

## 2016-01-08 ENCOUNTER — Telehealth: Payer: Self-pay | Admitting: Cardiovascular Disease

## 2016-01-08 NOTE — Telephone Encounter (Signed)
Pt says she can not take the generic Lipitor. She wants you to call in the regular Lipitor to [facility] [phone].

## 2016-01-08 NOTE — Telephone Encounter (Signed)
Spoke with pt. She reports she was able to take Lipitor for years but now is unable to take Atorvastatin due to muscle aches. She is not sure if muscle aches developed after change to generic.  Atorvastatin was decreased at last office visit but pt was unable to take this lower dose and stopped it about a month ago. She tried again about 2 weeks ago to take it and developed muscle aches so she stopped it. Aches went away after stopping Atorvastatin. Pt reports she has a discount card for name brand Lipitor which may help with cost and she is asking if Dr. Claiborne Billings felt she should try the name brand Lipitor or a different medication for her cholesterol.

## 2016-01-12 MED ORDER — ATORVASTATIN CALCIUM 40 MG PO TABS: 40.0000 mg | ORAL_TABLET | Freq: Every day | ORAL | 0 refills | Status: DC

## 2016-01-12 NOTE — Telephone Encounter (Signed)
Spoke to pt. She is wanting to try brand because this has been effective for her in the past. She does not want to try another statin unless insurance will not pay for branded medication. RX sent for brand. Instructed her to call if brand medication would be too expensive or if insurance will not pay. Pt states she understands and appreciates help.

## 2016-01-12 NOTE — Telephone Encounter (Signed)
LM return call about lipitor.

## 2016-01-12 NOTE — Telephone Encounter (Signed)
Ok for generic atorvastatin or rosuvastatin

## 2016-01-12 NOTE — Telephone Encounter (Signed)
Message also routed to clinical pharmacy staff for advice on statin medication

## 2016-01-12 NOTE — Telephone Encounter (Signed)
Patient transferred from refill team @ [address] office.   Patient wanted to speak to [facility] about her Rx  She states brand name lipitor, with her discount card, is $300. She would like to know other options  Patient would like the Rx to go to [facility]

## 2016-01-14 MED ORDER — ATORVASTATIN CALCIUM 10 MG PO TABS: 10.0000 mg | ORAL_TABLET | Freq: Every day | ORAL | 1 refills | Status: DC

## 2016-01-14 NOTE — Addendum Note (Signed)
Addended by: Fidel Levy on: 01/14/2016 09:21 AM   Modules accepted: Orders

## 2016-01-14 NOTE — Telephone Encounter (Signed)
Have her try atorvastatin 10 mg with CoQ10 300 mg (or mcg?) once daily.

## 2016-01-14 NOTE — Telephone Encounter (Signed)
Routed to clinical pharmacy staff.  MD recommended generic lipitor or crestor - patient had tried lipitor and cannot take anymore - no dose was recommended

## 2016-01-14 NOTE — Telephone Encounter (Signed)
Patient called with med recommendations & agreed w/plan. She has CoQ10 - was instructed on what dose to take. Rx(s) sent to pharmacy electronically.

## 2016-02-12 ENCOUNTER — Encounter: Payer: Self-pay | Admitting: Gynecology

## 2016-02-12 ENCOUNTER — Ambulatory Visit (INDEPENDENT_AMBULATORY_CARE_PROVIDER_SITE_OTHER): Payer: Medicare Other | Admitting: Gynecology

## 2016-02-12 VITALS — BP 132/70

## 2016-02-12 DIAGNOSIS — N9489 Other specified conditions associated with female genital organs and menstrual cycle: Secondary | ICD-10-CM | POA: Diagnosis not present

## 2016-02-12 DIAGNOSIS — R300 Dysuria: Secondary | ICD-10-CM | POA: Diagnosis not present

## 2016-02-12 DIAGNOSIS — R350 Frequency of micturition: Secondary | ICD-10-CM | POA: Diagnosis not present

## 2016-02-12 DIAGNOSIS — N898 Other specified noninflammatory disorders of vagina: Secondary | ICD-10-CM | POA: Diagnosis not present

## 2016-02-12 LAB — WET PREP FOR TRICH, YEAST, CLUE
CLUE CELLS WET PREP: NONE SEEN
Trich, Wet Prep: NONE SEEN
Yeast Wet Prep HPF POC: NONE SEEN

## 2016-02-12 LAB — URINALYSIS W MICROSCOPIC + REFLEX CULTURE
BILIRUBIN URINE: NEGATIVE
CRYSTALS: NONE SEEN [HPF]
Casts: NONE SEEN [LPF]
GLUCOSE, UA: NEGATIVE
HGB URINE DIPSTICK: NEGATIVE
Nitrite: NEGATIVE
Specific Gravity, Urine: 1.01 (ref 1.001–1.035)
Yeast: NONE SEEN [HPF]
pH: 6 (ref 5.0–8.0)

## 2016-02-12 MED ORDER — CLINDAMYCIN PHOSPHATE 2 % VA CREA: 1.0000 | TOPICAL_CREAM | Freq: Every day | VAGINAL | 0 refills | Status: DC

## 2016-02-12 MED ORDER — NITROFURANTOIN MONOHYD MACRO 100 MG PO CAPS: 100.0000 mg | ORAL_CAPSULE | Freq: Two times a day (BID) | ORAL | 0 refills | Status: DC

## 2016-02-12 NOTE — Patient Instructions (Signed)
Clindamycin capsules What is this medicine? CLINDAMYCIN ([place] sin) is a lincosamide antibiotic. It is used to treat certain kinds of bacterial infections. It will not work for colds, flu, or other viral infections. This medicine may be used for other purposes; ask your health care provider or pharmacist if you have questions. What should I tell my health care provider before I take this medicine? They need to know if you have any of these conditions: -kidney disease -liver disease -stomach problems like colitis -an unusual or allergic reaction to clindamycin, lincomycin, or other medicines, foods, dyes like tartrazine or preservatives -pregnant or trying to get pregnant -breast-feeding How should I use this medicine? Take this medicine by mouth with a full glass of water. Follow the directions on the prescription label. You can take this medicine with food or on an empty stomach. If the medicine upsets your stomach, take it with food. Take your medicine at regular intervals. Do not take your medicine more often than directed. Take all of your medicine as directed even if you think your are better. Do not skip doses or stop your medicine early. Talk to your pediatrician regarding the use of this medicine in children. Special care may be needed. Overdosage: If you think you have taken too much of this medicine contact a poison control center or emergency room at once. NOTE: This medicine is only for you. Do not share this medicine with others. What if I miss a dose? If you miss a dose, take it as soon as you can. If it is almost time for your next dose, take only that dose. Do not take double or extra doses. What may interact with this medicine? -birth control pills -chloramphenicol -erythromycin -kaolin products This list may not describe all possible interactions. Give your health care provider a list of all the medicines, herbs, non-prescription drugs, or dietary supplements you use.  Also tell them if you smoke, drink alcohol, or use illegal drugs. Some items may interact with your medicine. What should I watch for while using this medicine? Tell your doctor or healthcare professional if your symptoms do not start to get better or if they get worse. Do not treat diarrhea with over the counter products. Contact your doctor if you have diarrhea that lasts more than 2 days or if it is severe and watery. What side effects may I notice from receiving this medicine? Side effects that you should report to your doctor or health care professional as soon as possible: -allergic reactions like skin rash, itching or hives, swelling of the face, lips, or tongue -dark urine -pain on swallowing -redness, blistering, peeling or loosening of the skin, including inside the mouth -unusual bleeding or bruising -unusually weak or tired -yellowing of eyes or skin Side effects that usually do not require medical attention (report to your doctor or health care professional if they continue or are bothersome): -diarrhea -itching in the rectal or genital area -joint pain -nausea, vomiting -stomach pain This list may not describe all possible side effects. Call your doctor for medical advice about side effects. You may report side effects to FDA at 1-800-FDA-1088. Where should I keep my medicine? Keep out of the reach of children. Store at room temperature between 20 and 25 degrees C (68 and 77 degrees F). Throw away any unused medicine after the expiration date. NOTE: This sheet is a summary. It may not cover all possible information. If you have questions about this medicine, talk to your  doctor, pharmacist, or health care provider.    2016, Elsevier/Gold Standard. (2013-01-11 16:12:32) Nitrofurantoin tablets or capsules What is this medicine? NITROFURANTOIN (nye troe fyoor AN toyn) is an antibiotic. It is used to treat urinary tract infections. This medicine may be used for other purposes;  ask your health care provider or pharmacist if you have questions. What should I tell my health care provider before I take this medicine? They need to know if you have any of these conditions: -anemia -diabetes -glucose-6-phosphate dehydrogenase deficiency -kidney disease -liver disease -lung disease -other chronic illness -an unusual or allergic reaction to nitrofurantoin, other antibiotics, other medicines, foods, dyes or preservatives -pregnant or trying to get pregnant -breast-feeding How should I use this medicine? Take this medicine by mouth with a glass of water. Follow the directions on the prescription label. Take this medicine with food or milk. Take your doses at regular intervals. Do not take your medicine more often than directed. Do not stop taking except on your doctor's advice. Talk to your pediatrician regarding the use of this medicine in children. While this drug may be prescribed for selected conditions, precautions do apply. Overdosage: If you think you have taken too much of this medicine contact a poison control center or emergency room at once. NOTE: This medicine is only for you. Do not share this medicine with others. What if I miss a dose? If you miss a dose, take it as soon as you can. If it is almost time for your next dose, take only that dose. Do not take double or extra doses. What may interact with this medicine? -antacids containing magnesium trisilicate -probenecid -quinolone antibiotics like ciprofloxacin, lomefloxacin, norfloxacin and ofloxacin -sulfinpyrazone This list may not describe all possible interactions. Give your health care provider a list of all the medicines, herbs, non-prescription drugs, or dietary supplements you use. Also tell them if you smoke, drink alcohol, or use illegal drugs. Some items may interact with your medicine. What should I watch for while using this medicine? Tell your doctor or health care professional if your symptoms  do not improve or if you get new symptoms. Drink several glasses of water a day. If you are taking this medicine for a long time, visit your doctor for regular checks on your progress. If you are diabetic, you may get a false positive result for sugar in your urine with certain brands of urine tests. Check with your doctor. What side effects may I notice from receiving this medicine? Side effects that you should report to your doctor or health care professional as soon as possible: -allergic reactions like skin rash or hives, swelling of the face, lips, or tongue -chest pain -cough -difficulty breathing -dizziness, drowsiness -fever or infection -joint aches or pains -pale or blue-tinted skin -redness, blistering, peeling or loosening of the skin, including inside the mouth -tingling, burning, pain, or numbness in hands or feet -unusual bleeding or bruising -unusually weak or tired -yellowing of eyes or skin Side effects that usually do not require medical attention (report to your doctor or health care professional if they continue or are bothersome): -dark urine -diarrhea -headache -loss of appetite -nausea or vomiting -temporary hair loss This list may not describe all possible side effects. Call your doctor for medical advice about side effects. You may report side effects to FDA at 1-800-FDA-1088. Where should I keep my medicine? Keep out of the reach of children. Store at room temperature between 15 and 30 degrees C (59 and 86  degrees F). Protect from light. Throw away any unused medicine after the expiration date. NOTE: This sheet is a summary. It may not cover all possible information. If you have questions about this medicine, talk to your doctor, pharmacist, or health care provider.    2016, Elsevier/Gold Standard. (2007-12-27 15:56:47) Urinary Tract Infection Urinary tract infections (UTIs) can develop anywhere along your urinary tract. Your urinary tract is your body's  drainage system for removing wastes and extra water. Your urinary tract includes two kidneys, two ureters, a bladder, and a urethra. Your kidneys are a pair of bean-shaped organs. Each kidney is about the size of your fist. They are located below your ribs, one on each side of your spine. CAUSES Infections are caused by microbes, which are microscopic organisms, including fungi, viruses, and bacteria. These organisms are so small that they can only be seen through a microscope. Bacteria are the microbes that most commonly cause UTIs. SYMPTOMS  Symptoms of UTIs may vary by age and gender of the patient and by the location of the infection. Symptoms in young women typically include a frequent and intense urge to urinate and a painful, burning feeling in the bladder or urethra during urination. Older women and men are more likely to be tired, shaky, and weak and have muscle aches and abdominal pain. A fever may mean the infection is in your kidneys. Other symptoms of a kidney infection include pain in your back or sides below the ribs, nausea, and vomiting. DIAGNOSIS To diagnose a UTI, your caregiver will ask you about your symptoms. Your caregiver will also ask you to provide a urine sample. The urine sample will be tested for bacteria and white blood cells. White blood cells are made by your body to help fight infection. TREATMENT  Typically, UTIs can be treated with medication. Because most UTIs are caused by a bacterial infection, they usually can be treated with the use of antibiotics. The choice of antibiotic and length of treatment depend on your symptoms and the type of bacteria causing your infection. HOME CARE INSTRUCTIONS  If you were prescribed antibiotics, take them exactly as your caregiver instructs you. Finish the medication even if you feel better after you have only taken some of the medication.  Drink enough water and fluids to keep your urine clear or pale yellow.  Avoid caffeine,  tea, and carbonated beverages. They tend to irritate your bladder.  Empty your bladder often. Avoid holding urine for long periods of time.  Empty your bladder before and after sexual intercourse.  After a bowel movement, women should cleanse from front to back. Use each tissue only once. SEEK MEDICAL CARE IF:   You have back pain.  You develop a fever.  Your symptoms do not begin to resolve within 3 days. SEEK IMMEDIATE MEDICAL CARE IF:   You have severe back pain or lower abdominal pain.  You develop chills.  You have nausea or vomiting.  You have continued burning or discomfort with urination. MAKE SURE YOU:   Understand these instructions.  Will watch your condition.  Will get help right away if you are not doing well or get worse.   This information is not intended to replace advice given to you by your health care provider. Make sure you discuss any questions you have with your health care provider.   Document Released: 03/17/2005 Document Revised: 02/26/2015 Document Reviewed: 07/16/2011 Elsevier Interactive Patient Education [employer].

## 2016-02-12 NOTE — Progress Notes (Signed)
   HPI: 70 year old who presented to the office complaining of pelvic pressure and urinary frequency and some slight discharge and some pruritus. Patient not sexually active. 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 Patient currently on no hormone replacement therapy. Patient was no fever, chills, nausea, or vomiting. Patient has felt warm but no backache. No GI complaints and no change in appetite no weight loss   ROS: A ROS was performed and pertinent positives and negatives are included in the history.  GENERAL: No fevers or chills. HEENT: No change in vision, no earache, sore throat or sinus congestion. NECK: No pain or stiffness. CARDIOVASCULAR: No chest pain or pressure. No palpitations. PULMONARY: No shortness of breath, cough or wheeze. GASTROINTESTINAL: No abdominal pain, nausea, vomiting or diarrhea, melena or bright red blood per rectum. GENITOURINARY: See above MUSCULOSKELETAL: No joint or muscle pain, no back pain, no recent trauma. DERMATOLOGIC: No rash, no itching, no lesions. ENDOCRINE: No polyuria, polydipsia, no heat or cold intolerance. No recent change in weight. HEMATOLOGICAL: No anemia or easy bruising or bleeding. NEUROLOGIC: No headache, seizures, numbness, tingling or weakness. PSYCHIATRIC: No depression, no loss of interest in normal activity or change in sleep pattern.   PE: Blood pressure 132/70 Gen. appearance well-developed well-nourished with the above-mentioned complaining of acute distress : Back no CVA tenderness Abdomen: Slight suprapubic tenderness but no rebound or guarding Pelvic: Bartholin urethra Skene glands with atrophic changes no lesions seen Vagina: No lesions or discharge vaginal cuff intact Bimanual exam no palpable masses or tenderness suprapubic discomfort was noted over the bladder Rectal vaginal exam not done  Wet prep few bacteria and few wbc's  Urinalysis moderate bacteria, 20-40 WBC, 3-10  RBC   Assessment Plan: Signs and symptoms of urinary tract infection will be treated with Macrobid one by mouth twice a day for 7 days. Because of the vaginal odor the patient was experiencing as well as some mild irritation although there was no clue cells I'm going to call her in a prescription for Cleocin vaginal cream to apply daily at bedtime for one week.    Greater than 50% of time was spent in counseling and coordinating care of this patient.   Time of consultation: 15   Minutes.

## 2016-02-14 LAB — URINE CULTURE: Organism ID, Bacteria: 10000

## 2016-02-24 ENCOUNTER — Telehealth: Payer: Self-pay | Admitting: *Deleted

## 2016-02-24 MED ORDER — FLUCONAZOLE 150 MG PO TABS: 150.0000 mg | ORAL_TABLET | Freq: Once | ORAL | 0 refills | Status: AC

## 2016-02-24 MED ORDER — AMOXICILLIN-POT CLAVULANATE 500-125 MG PO TABS: 1.0000 | ORAL_TABLET | Freq: Two times a day (BID) | ORAL | 0 refills | Status: DC

## 2016-02-24 NOTE — Telephone Encounter (Signed)
I called pt to relay and she states unable to take Cipro, wasn't listed on her allergy list, (I will place on her allergic list)  it made her sick to her stomach. Pt asked if she could have something else? Please advise

## 2016-02-24 NOTE — Telephone Encounter (Signed)
Pt called to follow up from office visit with Dr.Fernandez on 02/12/16, states she is having urinary frequency and lower back pressure all over again,no chills, no fever,no vomiting. Pt said she felt good until couple of days ago, symptoms started again, no discharge. Please advise

## 2016-02-24 NOTE — Telephone Encounter (Signed)
Okay 

## 2016-02-24 NOTE — Telephone Encounter (Signed)
Ciprofloxacin 250 mg twice a day 7 days. Office visit if symptoms persist or worsen

## 2016-02-24 NOTE — Telephone Encounter (Signed)
Okay for Augmentin 500 mg every 12 hours 5 days

## 2016-02-24 NOTE — Telephone Encounter (Signed)
Pt informed with the below note, Rx sent.  Pt said you gave her Diflucan 150 tablets #5 back in July due to recurrent yeast infection due to the antibiotics from throat surgery. Would like refill on this as well.

## 2016-03-01 ENCOUNTER — Ambulatory Visit
Admission: RE | Admit: 2016-03-01 | Discharge: 2016-03-01 | Disposition: A | Discharge status: Home / Self Care | Payer: Medicare Other | Source: Ambulatory Visit | Attending: Internal Medicine | Admitting: Internal Medicine

## 2016-03-01 ENCOUNTER — Other Ambulatory Visit: Payer: Self-pay | Admitting: Internal Medicine

## 2016-03-01 DIAGNOSIS — R140 Abdominal distension (gaseous): Secondary | ICD-10-CM

## 2016-03-31 ENCOUNTER — Telehealth: Payer: Self-pay | Admitting: Cardiovascular Disease

## 2016-03-31 DIAGNOSIS — I10 Essential (primary) hypertension: Secondary | ICD-10-CM

## 2016-03-31 DIAGNOSIS — E785 Hyperlipidemia, unspecified: Secondary | ICD-10-CM

## 2016-03-31 NOTE — Telephone Encounter (Signed)
New message    Pt c/o BP issue: STAT if pt c/o blurred vision, one-sided weakness or slurred speech  1. What are your last 5 BP readings? Today 108/51    2. Are you having any other symptoms (ex. Dizziness, headache, blurred vision, passed out)? No - feel really bad , no energy   3. What is your BP issue? Wants to discuss with nurse

## 2016-03-31 NOTE — Telephone Encounter (Signed)
Returned call to patient.She stated for the past 2 weeks her B/P has been low ranging in low 100's last night 108/51.Stated she feels weak,no energy.Spoke to [facility] he advised to decrease lasix to 20 mg daily.Stated she would like to have fasting lab before appointment with Dr.Kelly next week.Lab orders cmet,lipid,cbc,tsh mailed to patient.

## 2016-04-05 ENCOUNTER — Telehealth: Payer: Self-pay | Admitting: Cardiovascular Disease

## 2016-04-05 LAB — COMPREHENSIVE METABOLIC PANEL
ALBUMIN: 4.4 g/dL (ref 3.6–5.1)
ALK PHOS: 87 U/L (ref 33–130)
ALT: 14 U/L (ref 6–29)
AST: 15 U/L (ref 10–35)
BILIRUBIN TOTAL: 0.7 mg/dL (ref 0.2–1.2)
BUN: 47 mg/dL — ABNORMAL HIGH (ref 7–25)
CALCIUM: 10.8 mg/dL — AB (ref 8.6–10.4)
CO2: 28 mmol/L (ref 20–31)
Chloride: 102 mmol/L (ref 98–110)
Creat: 1.24 mg/dL — ABNORMAL HIGH (ref 0.60–0.93)
GLUCOSE: 139 mg/dL — AB (ref 65–99)
POTASSIUM: 4.4 mmol/L (ref 3.5–5.3)
Sodium: 140 mmol/L (ref 135–146)
TOTAL PROTEIN: 7.7 g/dL (ref 6.1–8.1)

## 2016-04-05 LAB — CBC WITH DIFFERENTIAL/PLATELET
BASOS PCT: 0 %
Basophils Absolute: 0 cells/uL (ref 0–200)
EOS PCT: 1 %
Eosinophils Absolute: 73 cells/uL (ref 15–500)
HCT: 36.1 % (ref 35.0–45.0)
HEMOGLOBIN: 11.5 g/dL — AB (ref 11.7–15.5)
LYMPHS ABS: 1898 {cells}/uL (ref 850–3900)
Lymphocytes Relative: 26 %
MCH: 23 pg — ABNORMAL LOW (ref 27.0–33.0)
MCHC: 31.9 g/dL — ABNORMAL LOW (ref 32.0–36.0)
MCV: 72.3 fL — ABNORMAL LOW (ref 80.0–100.0)
MONOS PCT: 8 %
MPV: 11.3 fL (ref 7.5–12.5)
Monocytes Absolute: 584 cells/uL (ref 200–950)
NEUTROS ABS: 4745 {cells}/uL (ref 1500–7800)
Neutrophils Relative %: 65 %
PLATELETS: 264 10*3/uL (ref 140–400)
RBC: 4.99 MIL/uL (ref 3.80–5.10)
RDW: 15.3 % — ABNORMAL HIGH (ref 11.0–15.0)
WBC: 7.3 10*3/uL (ref 3.8–10.8)

## 2016-04-05 LAB — TSH: TSH: 1.19 m[IU]/L

## 2016-04-05 NOTE — Telephone Encounter (Signed)
Patient calling from lab site stating she wanted to see if Dr. Claiborne Billings would be OK to add on Hgb A1C. She already had labs drawn. Pt advised to contact PCP to see if they can accomodate this request. Dr. Claiborne Billings is out of office. I explained rationale to defer non-cardiac labwork to PCP or preferred specialist. Pt voiced understanding and thanks.

## 2016-04-07 LAB — LIPID PANEL
CHOL/HDL RATIO: 7.6 ratio — AB (ref <=5.0)
CHOLESTEROL: 297 mg/dL — AB (ref 125–200)
HDL: 39 mg/dL — AB (ref >=46)
LDL Cholesterol: 197 mg/dL — ABNORMAL HIGH (ref <130)
Triglycerides: 303 mg/dL — ABNORMAL HIGH (ref <150)
VLDL: 61 mg/dL — AB (ref <30)

## 2016-04-08 ENCOUNTER — Encounter: Payer: Self-pay | Admitting: Cardiovascular Disease

## 2016-04-08 ENCOUNTER — Ambulatory Visit (INDEPENDENT_AMBULATORY_CARE_PROVIDER_SITE_OTHER): Payer: Medicare Other | Admitting: Cardiovascular Disease

## 2016-04-08 ENCOUNTER — Telehealth: Payer: Self-pay | Admitting: Cardiovascular Disease

## 2016-04-08 VITALS — BP 122/70 | HR 61 | Ht 66.0 in | Wt 210.6 lb

## 2016-04-08 DIAGNOSIS — I10 Essential (primary) hypertension: Secondary | ICD-10-CM | POA: Diagnosis not present

## 2016-04-08 DIAGNOSIS — E782 Mixed hyperlipidemia: Secondary | ICD-10-CM

## 2016-04-08 DIAGNOSIS — I428 Other cardiomyopathies: Secondary | ICD-10-CM

## 2016-04-08 DIAGNOSIS — E785 Hyperlipidemia, unspecified: Secondary | ICD-10-CM

## 2016-04-08 DIAGNOSIS — G4733 Obstructive sleep apnea (adult) (pediatric): Secondary | ICD-10-CM | POA: Diagnosis not present

## 2016-04-08 DIAGNOSIS — Z9989 Dependence on other enabling machines and devices: Secondary | ICD-10-CM

## 2016-04-08 MED ORDER — EZETIMIBE 10 MG PO TABS
10.0000 mg | ORAL_TABLET | Freq: Every day | ORAL | 3 refills | Status: DC
Start: 2016-04-08 — End: 2016-06-30

## 2016-04-08 MED ORDER — ICOSAPENT ETHYL 1 G PO CAPS: 2.0000 | ORAL_CAPSULE | Freq: Two times a day (BID) | ORAL | 3 refills | Status: DC

## 2016-04-08 NOTE — Patient Instructions (Addendum)
Your physician has recommended you make the following change in your medication:  1.) start generic zetia prescription. This has been sent to your  Pharmacy.  2.) restart the atorvastatin at 10 mg daily for 2 weeks then increase to 20 mg daily if tolerated.  3.) take the fish oil 2 capsules twice a day.   Your physician recommends that you return for lab work in: 2 months fasting.  Your physician recommends that you schedule a follow-up appointment in: 3 months.

## 2016-04-08 NOTE — Telephone Encounter (Signed)
°*  STAT* If patient is at the pharmacy, call can be transferred to refill team.   1. Which medications need to be refilled? (please list name of each medication and dose if known) Vascepa-New prescription-please call this asap  2. Which pharmacy/location (including street and city if local pharmacy) is medication to be sent to?[facility] 3. Do they need a 30 day or 90 day supply? 90 and refills

## 2016-04-09 MED ORDER — OMEGA-3-ACID ETHYL ESTERS 1 G PO CAPS: 2.0000 g | ORAL_CAPSULE | Freq: Two times a day (BID) | ORAL | 5 refills | Status: DC

## 2016-04-09 MED ORDER — OMEGA-3-ACID ETHYL ESTERS 1 G PO CAPS: 2.0000 g | ORAL_CAPSULE | Freq: Two times a day (BID) | ORAL | 3 refills | Status: DC

## 2016-04-09 NOTE — Telephone Encounter (Signed)
Advised patient and sent to local and mail order pharmacy

## 2016-04-09 NOTE — Progress Notes (Signed)
Patient ID: Jamie Ayers, female   DOB: [birth year], 70 y.o.   MRN: [id number]     Primary  M.D.: Dr. Kevan Ny  HPI: Jamie Ayers is a 70 y.o. female who presents to the office for a 6 month cardiology evaluation.  .  Ms. Fosse has a remote history of a nonischemic cardiomyopathy.  In December 2005 her ejection fraction was 20%. With aggressive medical therapy LV function normalized to approximately 55%. She has a history of obstructive sleep apnea and has been utilizing CPAP therapy since 2008. 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 She also has undergone papilloma from a breast duct removal at the time of the hysterectomy.  An echo Doppler study on 03/20/2013 showed an ejection fraction at 50-55% with mild concentric left ventricular hypertrophy. She had normal diastolic function. There was evidence for mild aortic insufficiency and mild mitral regurg patient with a centrally directed jet. Her left atrium was mildly dilated. She recently had repeat laboratory done by Dr. Kevan Ny.  Laboratory on 10/05/2013 revealed hemoglobin11.6/ hematocrit 36.0.  She did have microcytic indices at 73.5.  She denies any history of sickle cell trait or thalassemia.  She is unaware of blood loss.  Glucose was elevated at 147.  She tells me in the past Dr. Marlou Sa had tried metformin but she did not tolerate this.  TSH was 1.054.  She is continuing to use CPAP with 100% compliance.  She denies daytime fatigue or awareness of breakthrough snoring.   On 01/28/2015 a follow-up echo Doppler study revealed an ejection fraction at 60-65% without wall motion abnormalities.  There was grade 1 diastolic dysfunction, mild aortic and mild mitral regurgitation.  The left atrium was mildly dilated.  Recently, she has noticed some mild myalgias in her legs.    She was seen by Dr. Percival Spanish earlier this year with complaints of  fatigue and some shortness of breath.  BNP was normal at 12.7.  Blood work by Dr. Marlou Sa from 03/02/2017revealed  a TSH of 0.65.  She had microcytic indices with an MCV of 73, hemoglobin 12.4.  Her creatinine had risen to 1.58 with a BUN of 44.  Calcium was mildly elevated at 10.9.  Saw her in follow-up one month later.  She continues to use CPAP therapy.  I was able to review a download from 08/13/2015 through 09/11/2015.  She has S9.  AutoSet CPAP ResMed unit, 95th percentile pressure is 15.5.  AHI is excellent at 0.5.  She is compliant with 97% of usage stays averaging approximately 8 hours sleep per night.    Over the past 6 months, he denies any episodes of chest pain or palpitations.  Repeat blood work.  He days ago revealed a BUN of 47 and creatinine of 1.24.  Her calcium was mildly increased at 10.8.  Lipid studies were markedly abnormal.  Apparently, she had stopped taking atorvastatin the past 2 months.  Her total cholesterol had risen to 297, triglycerides 303, HDL 39, VLDL 61, and LDL 197.  Her hemoglobin is 11.5 with hematocrit 36.1.  She has had consistent microcytosis.  TSH remained normal at 1.19.  She presents for evaluation.   Past Medical History:  Diagnosis Date  . Anemia   . Anxiety disorder   . Asthmatic bronchitis   . Benign essential HTN 08/06/11   ECHO-EF>55%  . CHF (congestive heart failure) (HCC)    HeFPEF  .  Disorder of vocal cord   . GERD (gastroesophageal reflux disease)   . GERD (gastroesophageal reflux disease)   . Hernia   . Hyperlipidemia 03/14/12   Lexiscan myoview-WNL; unchanged from previous study.  . OSA on CPAP 03/28/07   sleep study-[facility]-AHI 9.42/HR and during REM sleep '@37' .50/hr. The average 02 sat duringREM AND NREM was 97.0%  . Thyroid disease   . Upper respiratory infection     Past Surgical History:  Procedure Laterality Date  . ABDOMINAL HYSTERECTOMY  2013   TAH BSO  . BREAST SURGERY     Papilloma  . DILATION AND  CURETTAGE OF UTERUS    . ENDOMETRIAL BIOPSY    . HERNIA REPAIR    . HYSTEROSCOPY    . KNEE SURGERY     arthroscopic  . NISSEN FUNDOPLICATION    . OOPHORECTOMY     BSO  . Parathyroid surg    . Vocal Cord Surg      Allergies  Allergen Reactions  . Midazolam Other (See Comments)    REACTION: cardiac arrest  . Midazolam Hcl     Cardiac arrest    VERSED  . Codeine Hives and Itching  . Morphine And Related Hives  . Paba Derivatives Nausea Only    PT REPORTS SEVERE NAUSEA AFTER SURGERY, DENIES VOMITING  . Sulfonamide Derivatives Hives and Itching  . Sulfamethoxazole Hives    Current Outpatient Prescriptions  Medication Sig Dispense Refill  . amoxicillin-clavulanate (AUGMENTIN) 500-125 MG tablet Take 1 tablet (500 mg total) by mouth every 12 (twelve) hours. 10 tablet 0  . atorvastatin (LIPITOR) 10 MG tablet Take 1 tablet (10 mg total) by mouth daily. 90 tablet 1  . Calcium Carbonate-Vitamin D (CALCIUM + D PO) Take 1 tablet by mouth daily.     . carvedilol (COREG CR) 40 MG 24 hr capsule Take 1 capsule (40 mg total) by mouth every evening. 90 capsule 3  . digoxin (LANOXIN) 0.125 MG tablet Take 1 tablet (125 mcg total) by mouth daily. 90 tablet 3  . esomeprazole (NEXIUM) 40 MG capsule Take 1 capsule (40 mg total) by mouth 2 (two) times daily before a meal. 180 capsule 3  . furosemide (LASIX) 40 MG tablet Take ( 1/2 tablet ) 20 mg daily 90 tablet 3  . montelukast (SINGULAIR) 10 MG tablet Take 10 mg by mouth at bedtime.    . Multiple Vitamin (MULTIVITAMIN) tablet Take 1 tablet by mouth daily.      . NON FORMULARY CPAP THERAPY    . NONFORMULARY OR COMPOUNDED ITEM Boric acid vaginal suppositories 600 mg one per vagina as needed for yeast 30 each 2  . sitaGLIPtin (JANUVIA) 100 MG tablet Take 50 mg by mouth daily.    . valsartan (DIOVAN) 160 MG tablet Take 1 tablet (160 mg total) by mouth 2 (two) times daily. 180 tablet 3  . zolpidem (AMBIEN) 10 MG tablet Take 10 mg by mouth at bedtime as  needed. For sleep    . albuterol (PROVENTIL HFA;VENTOLIN HFA) 108 (90 BASE) MCG/ACT inhaler Inhale 2 puffs into the lungs every 4 (four) hours as needed for wheezing. 3.7 g 0  . ezetimibe (ZETIA) 10 MG tablet Take 1 tablet (10 mg total) by mouth daily. 90 tablet 3  . Icosapent Ethyl (VASCEPA) 1 g CAPS Take 2 capsules by mouth 2 (two) times daily. 360 capsule 3   No current facility-administered medications for this visit.     Social History   Social  History  . Marital status: Married    Spouse name: N/A  . Number of children: N/A  . Years of education: N/A   Occupational History  . Not on file.   Social History Main Topics  . Smoking status: Never Smoker  . Smokeless tobacco: Not on file  . Alcohol use No  . Drug use: No  . Sexual activity: Yes    Birth control/ protection: Post-menopausal, Surgical     Comment: HYST-1st intercourse 70 yo-Fewer than 5 partners   Other Topics Concern  . Not on file   Social History Narrative  . No narrative on file    Family History  Problem Relation Age of Onset  . Diabetes Father   . Hypertension Father   . Cancer Father     Prostate cancer  . Arthritis Father   . Gout Father   . Hypertension Mother   . Stroke Mother   . Breast cancer Maternal Grandmother     Age 17's  . Ovarian cancer Paternal Grandmother   . Breast cancer Sister     Age 21  . Kidney disease Sister    Social history is notable in that she is married has 4 children 9 grandchildren. There is no tobacco or alcohol use.  ROS General: Negative; No fevers, chills, or night sweats;  HEENT: Occasional hoarseness.  In the past she has had difficulty with vocal cord papillomas. No changes in vision or hearing, sinus congestion, difficulty swallowing Pulmonary: Negative; No cough, wheezing, shortness of breath, hemoptysis Cardiovascular: Negative; No chest pain, presyncope, syncope, palpitations GI: Negative; No nausea, vomiting, diarrhea, or abdominal pain GU:  Negative; No dysuria, hematuria, or difficulty voiding Musculoskeletal: Positive for mild myalgias; no joint pain, or weakness Hematologic/Oncology: Negative; no easy bruising, bleeding Endocrine: Positive for diabetes mellitus.  No cold or heat intolerance. Neuro: Negative; no changes in balance, headaches Skin: Negative; No rashes or skin lesions Psychiatric: Negative; No behavioral problems, depression Sleep: Positive for sleep apnea, for which she uses CPAP every night.  No residual snoring, daytime sleepiness, hypersomnolence, bruxism, restless legs, hypnogognic hallucinations, no cataplexy Other comprehensive 14 point system review is negative.   PE BP 122/70   Pulse 61   Ht '5\' 6"'  (1.676 m)   Wt 210 lb 9.6 oz (95.5 kg)   BMI 33.99 kg/m    Wt Readings from Last 3 Encounters:  04/08/16 210 lb 9.6 oz (95.5 kg)  10/10/15 216 lb (98 kg)  09/05/15 213 lb 14.4 oz (97 kg)   General: Alert, oriented, no distress.  Skin: normal turgor, no rashes HEENT: Normocephalic, atraumatic. Pupils round and reactive; sclera anicteric;no lid lag.  Nose without nasal septal hypertrophy Mouth/Parynx benign; Mallinpatti scale 3 Neck: Thick neck No JVD, no carotid bruits with normal carolti upstroke Lungs: clear to ausculatation and percussion; no wheezing or rales Chest wall: Nontender to palpation Heart: RRR, s1 s2 normal 2/6 systolic murmur; no diastolic murmur, rubs, thrills or heaves Abdomen: Significant central or possibly soft, nontender; no hepatosplenomehaly, BS+; abdominal aorta nontender and not dilated by palpation. Back: No CVA tenderness Pulses 2+ Extremities: Trace ankle swelling; no clubbing cyanosis, Homan's sign negative  Neurologic: grossly nonfocal Psychologic: Normal affect and mood  ECG (independently read by me): Normal sinus rhythm at 61 bpm.  LVH by voltage.  Borderline first-degree AV block with PR interval 206 ms.  April 2017 ECG (independently read by me): Normal  sinus rhythm at 64 bpm.  First-degree AV block with a PR interval  of 218 ms.  ECG (independently read by me): Normal sinus rhythm at 68 bpm.  LVH by voltage in aVL.  July 2016 ECG (independently read by me): Normal sinus rhythm at 64 bpm..  Borderline LVH.  No cigarette ST changes.  August 2015 ECG (independently read by me): Normal sinus rhythm at 67 beats per minute.  Moderate voltage criteria for LVH.  No significant ST changes.  Borderline first degree AV block with a PR interval at 208 ms.  10/08/2013 ECG (independently read by me): Sinus rhythm at 59 beats per minute.  Borderline first-degree block with PR interval of 220 ms.  QTc interval normal.  Prior 04/17/2013 ECG: Normal sinus rhythm at 62 beats per minute. Borderline first degree AV block is no longer present with her PR interval now at 184 ms.  LABS: BMP Latest Ref Rng & Units 04/05/2016 10/05/2013 02/05/2013  Glucose 65 - 99 mg/dL 139(H) 147(H) 144(H)  BUN 7 - 25 mg/dL 47(H) 17 24(H)  Creatinine 0.60 - 0.93 mg/dL 1.24(H) 0.95 0.97  Sodium 135 - 146 mmol/L 140 141 140  Potassium 3.5 - 5.3 mmol/L 4.4 4.0 4.2  Chloride 98 - 110 mmol/L 102 102 100  CO2 20 - 31 mmol/L 28 29 32  Calcium 8.6 - 10.4 mg/dL 10.8(H) 9.6 10.3    Hepatic Function Latest Ref Rng & Units 04/05/2016 10/05/2013 02/05/2013  Total Protein 6.1 - 8.1 g/dL 7.7 6.9 6.9  Albumin 3.6 - 5.1 g/dL 4.4 4.1 4.5  AST 10 - 35 U/L '15 14 20  ' ALT 6 - 29 U/L 14 20 41(H)  Alk Phosphatase 33 - 130 U/L 87 86 82  Total Bilirubin 0.2 - 1.2 mg/dL 0.7 0.8 1.0   CBC Latest Ref Rng & Units 04/05/2016 10/05/2013 02/05/2013  WBC 3.8 - 10.8 K/uL 7.3 7.4 8.1  Hemoglobin 11.7 - 15.5 g/dL 11.5(L) 11.6(L) 12.1  Hematocrit 35.0 - 45.0 % 36.1 36.0 38.3  Platelets 140 - 400 K/uL 264 241 247   Lab Results  Component Value Date   MCV 72.3 (L) 04/05/2016   MCV 73.5 (L) 10/05/2013   MCV 76.1 (L) 02/05/2013   Lab Results  Component Value Date   TSH 1.19 04/05/2016   Lab Results    Component Value Date   HGBA1C 8.4 (H) 10/08/2013   Lipid Panel     Component Value Date/Time   CHOL 297 (H) 04/05/2016 1104   TRIG 303 (H) 04/05/2016 1104   HDL 39 (L) 04/05/2016 1104   CHOLHDL 7.6 (H) 04/05/2016 1104   VLDL 61 (H) 04/05/2016 1104   LDLCALC 197 (H) 04/05/2016 1104    RADIOLOGY: No results found.    ASSESSMENT AND PLAN: Ms. Gilkey is a 70 year old African-American female who has a history of morbid obesity and a history of a nonischemic cardiomyopathy with an ejection fraction of 20% which subsequently has normalized.  Her last  echo Doppler study in August 2016 continue to show normal systolic function and there was grade 1 diastolic dysfunction.  There was mild AR and mild MR.  Her left atrium was mildly dilated..  A nuclear perfusion study in September 2013 showed normal perfusion without scar or ischemia.  She has severe sleep apnea and continues to utilize her CPAP therapy.  He admits to 100% compliance.  IHer most recent download reveals an excellent AHI at 0.5.  She continues to be very compliant with CPAP use.  I reviewed recent blood work by Dr. Marlou Sa March 2017.   On  blood work by Dr. Marlou Sa earlier this year.  She had renal insufficiency and her creatinine had risen to 1.58.  Ct has improved to 1.24 off spironolactone.  She has stopped taking atorvastatin for the last several months.  Her lipid studies were reviewed with her in detail and are now markedly abnormal.  I have recommended a trial of resuming Lipitor at 10 mg daily and also adding Zetia 10 mg concomitantly.  I recommended coenzyme Q10 300 mg daily.  I'm also adding Cipro 2 capsules twice a day.  Repeat blood work will be obtained in 2 months.  I will see her in 3 months for reevaluation.   Time spent: 25 minutes  Troy Sine, MD, [facility]  04/09/2016 12:54 PM

## 2016-04-09 NOTE — Telephone Encounter (Signed)
Left message to call back  

## 2016-04-09 NOTE — Telephone Encounter (Signed)
Will forward to Pharm D since this is a cholesterol lowering medication

## 2016-04-09 NOTE — Telephone Encounter (Signed)
Vascepa is a form of fish oil. I am not certain that Lovaza will be a cheaper copay for her but we could try Lovaza 2gm BID and see if her plan prefers this medication.

## 2016-04-09 NOTE — Telephone Encounter (Signed)
Follow up       Pt cannot afford icosapent 1gm.  It cost  600.00.  Also, pt want Dr Claiborne Billings to give her a presc for fish oil instead of using over-the-counter

## 2016-05-11 ENCOUNTER — Telehealth: Payer: Self-pay | Admitting: Cardiovascular Disease

## 2016-05-11 MED ORDER — FUROSEMIDE 40 MG PO TABS: ORAL_TABLET | ORAL | 1 refills | Status: DC

## 2016-05-11 MED ORDER — DIGOXIN 125 MCG PO TABS: 125.0000 ug | ORAL_TABLET | Freq: Every day | ORAL | 1 refills | Status: DC

## 2016-05-11 MED ORDER — ESOMEPRAZOLE MAGNESIUM 40 MG PO CPDR: 40.0000 mg | DELAYED_RELEASE_CAPSULE | Freq: Two times a day (BID) | ORAL | 1 refills | Status: DC

## 2016-05-11 MED ORDER — ATORVASTATIN CALCIUM 10 MG PO TABS: 10.0000 mg | ORAL_TABLET | Freq: Every day | ORAL | 1 refills | Status: DC

## 2016-05-11 MED ORDER — CARVEDILOL PHOSPHATE ER 40 MG PO CP24: 40.0000 mg | ORAL_CAPSULE | Freq: Every evening | ORAL | 1 refills | Status: DC

## 2016-05-11 NOTE — Telephone Encounter (Signed)
New message       *STAT* If patient is at the pharmacy, call can be transferred to refill team.   1. Which medications need to be refilled? (please list name of each medication and dose if known) nexium '40mg'$ , furosemide '40mg'$ , digoxin, carvedilol '40mg'$ , atorvastatin '10mg'$  2. Which pharmacy/location (including street and city if local pharmacy) is medication to be sent to? meds by mail  3. Do they need a 30 day or 90 day supply? 90 day supply---------pharmacy never received refill request

## 2016-05-11 NOTE — Telephone Encounter (Signed)
Follow up      Pt states that she never received the special envelope to mail her sleep study card to Dr Claiborne Billings. Please call if there is a problem

## 2016-05-11 NOTE — Telephone Encounter (Signed)
Meds refilled.  Sent to [facility] to address sleep study question.

## 2016-06-11 ENCOUNTER — Encounter: Payer: Self-pay | Admitting: Gynecology

## 2016-06-11 ENCOUNTER — Ambulatory Visit (INDEPENDENT_AMBULATORY_CARE_PROVIDER_SITE_OTHER): Payer: Medicare Other | Admitting: Gynecology

## 2016-06-11 VITALS — BP 124/74 | Ht 63.0 in | Wt 208.0 lb

## 2016-06-11 DIAGNOSIS — Z01411 Encounter for gynecological examination (general) (routine) with abnormal findings: Secondary | ICD-10-CM

## 2016-06-11 DIAGNOSIS — N952 Postmenopausal atrophic vaginitis: Secondary | ICD-10-CM

## 2016-06-11 NOTE — Patient Instructions (Signed)

## 2016-06-11 NOTE — Progress Notes (Signed)
    Jamie Ayers [birth year] [id number]        70 y.o.  [id number] for breast and pelvic exam.  Past medical history,surgical history, problem list, medications, allergies, family history and social history were all reviewed and documented as reviewed in the EPIC chart.  ROS:  Performed with pertinent positives and negatives included in the history, assessment and plan.   Additional significant findings :  None   Exam: [name] assistant Vitals:   06/11/16 1111  BP: 124/74  Weight: 208 lb (94.3 kg)  Height: '5\' 3"'$  (1.6 m)   Body mass index is 36.85 kg/m.  General appearance:  Normal affect, orientation and appearance. Skin: Grossly normal HEENT: Without gross lesions.  No cervical or supraclavicular adenopathy. Thyroid normal.  Lungs:  Clear without wheezing, rales or rhonchi Cardiac: RR, without RMG Abdominal:  Soft, nontender, without masses, guarding, rebound, organomegaly or hernia Breasts:  Examined lying and sitting without masses, retractions, discharge or axillary adenopathy. Pelvic:  Ext, BUS, Vagina with atrophic changes  Adnexa without masses or tenderness    Anus and perineum normal   Rectovaginal normal sphincter tone without palpated masses or tenderness.    Assessment/Plan:  70 y.o. [id number] female for breast and pelvic exam.   1. Postmenopausal/atrophic genital changes. 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 Doing well without significant hot flushes, night sweats or vaginal dryness. 2. Pap smear 2016. No Pap smear done today. No history of significant abnormal Pap smears. Reviewed current screening guidelines. Options to stop screening altogether based on age and hysterectomy history reviewed. Will readdress on an annual basis. 3. Mammography 08/2015. Continue with annual mammography when due. SBE monthly reviewed. 4. Colonoscopy 2017. Repeat at their recommended interval. 5. DEXA 2015 normal. Repeat at 5 year interval. Increased calcium vitamin D  reviewed. 6. Health maintenance. No routine lab work done as patient does this elsewhere. Follow up 1 year, sooner as needed.   Anastasio Auerbach MD, 11:33 AM 06/11/2016

## 2016-06-15 ENCOUNTER — Ambulatory Visit (INDEPENDENT_AMBULATORY_CARE_PROVIDER_SITE_OTHER): Payer: Medicare Other | Admitting: Family Medicine

## 2016-06-15 ENCOUNTER — Encounter (HOSPITAL_BASED_OUTPATIENT_CLINIC_OR_DEPARTMENT_OTHER): Payer: Self-pay

## 2016-06-15 ENCOUNTER — Ambulatory Visit (INDEPENDENT_AMBULATORY_CARE_PROVIDER_SITE_OTHER): Payer: Medicare Other

## 2016-06-15 ENCOUNTER — Ambulatory Visit (HOSPITAL_BASED_OUTPATIENT_CLINIC_OR_DEPARTMENT_OTHER)
Admission: RE | Admit: 2016-06-15 | Discharge: 2016-06-15 | Disposition: A | Discharge status: Home / Self Care | Payer: Medicare Other | Source: Ambulatory Visit | Attending: Family Medicine | Admitting: Family Medicine

## 2016-06-15 VITALS — BP 102/52 | HR 80 | Temp 99.0°F | Resp 16 | Ht 63.0 in | Wt 207.0 lb

## 2016-06-15 DIAGNOSIS — R935 Abnormal findings on diagnostic imaging of other abdominal regions, including retroperitoneum: Secondary | ICD-10-CM | POA: Diagnosis not present

## 2016-06-15 DIAGNOSIS — I700 Atherosclerosis of aorta: Secondary | ICD-10-CM | POA: Insufficient documentation

## 2016-06-15 DIAGNOSIS — R1084 Generalized abdominal pain: Secondary | ICD-10-CM | POA: Diagnosis not present

## 2016-06-15 DIAGNOSIS — D72829 Elevated white blood cell count, unspecified: Secondary | ICD-10-CM | POA: Diagnosis not present

## 2016-06-15 DIAGNOSIS — K859 Acute pancreatitis without necrosis or infection, unspecified: Secondary | ICD-10-CM

## 2016-06-15 DIAGNOSIS — R609 Edema, unspecified: Secondary | ICD-10-CM | POA: Diagnosis not present

## 2016-06-15 HISTORY — DX: Type 2 diabetes mellitus without complications: E11.9

## 2016-06-15 LAB — CMP14+EGFR
A/G RATIO: 18.8 — AB (ref 1.2–2.2)
ALK PHOS: 83 IU/L (ref 39–117)
ALT: 15 IU/L (ref 0–32)
AST: 17 IU/L (ref 0–40)
Albumin: 4.4 g/dL (ref 3.5–4.8)
BUN/Creatinine Ratio: 22 (ref 12–28)
BUN: 29 mg/dL — ABNORMAL HIGH (ref 8–27)
Bilirubin Total: 1.3 mg/dL — ABNORMAL HIGH (ref 0.0–1.2)
CO2: 21 mmol/L (ref 18–29)
Calcium: 10.1 mg/dL (ref 8.7–10.3)
Chloride: 103 mmol/L (ref 96–106)
Creatinine, Ser: 1.33 mg/dL — ABNORMAL HIGH (ref 0.57–1.00)
GFR calc Af Amer: 47 mL/min/{1.73_m2} — ABNORMAL LOW (ref >59)
GFR calc non Af Amer: 41 mL/min/{1.73_m2} — ABNORMAL LOW (ref >59)
GLOBULIN, TOTAL: 3 g/dL (ref 1.5–4.5)
Glucose: 150 mg/dL — ABNORMAL HIGH (ref 65–99)
POTASSIUM: 5 mmol/L (ref 3.5–5.2)
SODIUM: 137 mmol/L (ref 134–144)
Total Protein: 7.3 g/dL (ref 6.0–8.5)

## 2016-06-15 LAB — CBC WITH DIFFERENTIAL/PLATELET
BASOS: 0 %
Basophils Absolute: 0 10*3/uL (ref 0.0–0.2)
EOS (ABSOLUTE): 0 10*3/uL (ref 0.0–0.4)
EOS: 0 %
HEMATOCRIT: 34.1 % (ref 34.0–46.6)
Hemoglobin: 11.5 g/dL (ref 11.1–15.9)
LYMPHS ABS: 2 10*3/uL (ref 0.7–3.1)
Lymphs: 10 %
MCH: 23.6 pg — AB (ref 26.6–33.0)
MCHC: 33.7 g/dL (ref 31.5–35.7)
MCV: 70 fL — AB (ref 79–97)
Monocytes Absolute: 2 10*3/uL — ABNORMAL HIGH (ref 0.1–0.9)
Monocytes: 10 %
NEUTROS ABS: 16.2 10*3/uL — AB (ref 1.4–7.0)
Neutrophils: 79 %
Platelets: 230 10*3/uL (ref 150–379)
RBC: 4.88 x10E6/uL (ref 3.77–5.28)
RDW: 14.9 % (ref 12.3–15.4)
WBC: 20.2 10*3/uL (ref 3.4–10.8)

## 2016-06-15 LAB — POCT CBC
GRANULOCYTE PERCENT: 86.4 % — AB (ref 37–80)
HEMATOCRIT: 34.7 % — AB (ref 37.7–47.9)
Hemoglobin: 11.4 g/dL — AB (ref 12.2–16.2)
Lymph, poc: 2.2 (ref 0.6–3.4)
MCH: 23.9 pg — AB (ref 27–31.2)
MCHC: 32.9 g/dL (ref 31.8–35.4)
MCV: 72.8 fL — AB (ref 80–97)
MID (CBC): 0.5 (ref 0–0.9)
MPV: 8.9 fL (ref 0–99.8)
POC GRANULOCYTE: 17.5 — AB (ref 2–6.9)
POC LYMPH %: 10.9 % (ref 10–50)
POC MID %: 2.7 %M (ref 0–12)
Platelet Count, POC: 179 10*3/uL (ref 142–424)
RBC: 4.77 M/uL (ref 4.04–5.48)
RDW, POC: 14.3 %
WBC: 20.2 10*3/uL — AB (ref 4.6–10.2)

## 2016-06-15 LAB — AMYLASE: Amylase: 1120 U/L (ref 31–124)

## 2016-06-15 LAB — LIPASE: Lipase: 624 U/L — ABNORMAL HIGH (ref 14–72)

## 2016-06-15 LAB — IMMATURE CELLS: Bands(Auto) Relative: 1 %

## 2016-06-15 MED ORDER — LORAZEPAM 1 MG PO TABS: 0.5000 mg | ORAL_TABLET | Freq: Once | ORAL | 1 refills | Status: DC

## 2016-06-15 MED ORDER — LORAZEPAM 0.5 MG PO TABS: 0.5000 mg | ORAL_TABLET | Freq: Once | ORAL | 0 refills | Status: AC

## 2016-06-15 MED ORDER — IOPAMIDOL (ISOVUE-300) INJECTION 61%
100.0000 mL | Freq: Once | INTRAVENOUS | Status: AC | PRN
  Administered 2016-06-15: 100 mL via INTRAVENOUS

## 2016-06-15 NOTE — Progress Notes (Signed)
Patient ID: Jamie Ayers, female    DOB: 1946-02-11, 70 y.o.   MRN: [id number]  PCP: [name], MD  Chief Complaint  Patient presents with  . Abdominal Cramping  . Emesis    Subjective:  HPI New Patient  Jamie Ayers, 70 year old female presents with a complaint of abdominal pain x 3 days.  Chronic medical problems include: Pain is sharp to colicky cramping bilateral, mid abdomen. Worsened by lying down. Reports 1 episode of emesis x 3 days ago. Persistent nausea without vomiting since that time. Reports diminished fluid intake, and hasn't been able to eat anything x 3 days. Denies fever, diarrhea, mostly constipation and bloating. Taking Miralax with only a small production of stool x 1 days ago. Hx of acid reflux, although denies any recent reflux symptoms. Reports blood sugars range 130-140's. Denies hx of alcohol use. Reports in 2000 she received a biopsy of lung lesions which were benign. No hx of repeat scan or follow-up.   Social History   Social History  . Marital status: Married    Spouse name: N/A  . Number of children: N/A  . Years of education: N/A   Occupational History  . Not on file.   Social History Main Topics  . Smoking status: Never Smoker  . Smokeless tobacco: Never Used  . Alcohol use No  . Drug use: No  . Sexual activity: Yes    Birth control/ protection: Post-menopausal, Surgical     Comment: HYST-1st intercourse 70 yo-Fewer than 5 partners   Other Topics Concern  . Not on file   Social History Narrative  . No narrative on file    Family History  Problem Relation Age of Onset  . Diabetes Father   . Hypertension Father   . Cancer Father     Prostate cancer  . Arthritis Father   . Gout Father   . Hypertension Mother   . Stroke Mother   . Breast cancer Maternal Grandmother     Age 26's  . Ovarian cancer Paternal Grandmother   . Breast cancer Sister     Age 64  . Kidney disease Sister    Review of Systems  See HPI   Patient  Active Problem List   Diagnosis Date Noted  . Exertional shortness of breath 01/03/2015  . Chest pain 01/03/2015  . Morbid obesity ([place]) 10/08/2013  . Microcytic anemia 10/08/2013  . OSA on CPAP 10/08/2013  . NICM (nonischemic cardiomyopathy) ([place]) 03/11/2013  . Hyperlipemia 07/10/2012  . Allergic rhinitis 07/10/2012  . Sleep apnea 07/10/2012  . 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 Upper respiratory infection   . Asthmatic bronchitis   . CHF (congestive heart failure) ([place])   . Benign essential HTN   . GERD (gastroesophageal reflux disease)   . Intraductal papilloma of breast 08/31/2011  . 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/2011  . Fibroid   . Endocervical polyp   . CONSTIPATION, CHRONIC 08/26/2010  . INTESTINAL GAS 08/26/2010  . FULL INCONTINENCE OF FECES 08/26/2010  . COLONIC POLYPS, ADENOMATOUS, HX OF 08/24/2010    Allergies  Allergen Reactions  . Midazolam Other (See Comments)    REACTION: cardiac arrest  . Midazolam Hcl     Cardiac arrest    VERSED  . Codeine Hives and Itching  . Morphine And Related Hives  . Paba Derivatives Nausea Only    PT REPORTS SEVERE NAUSEA AFTER SURGERY, DENIES VOMITING  . Sulfonamide Derivatives Hives and Itching  . Sulfamethoxazole Hives  Prior to Admission medications   Medication Sig Start Date End Date Taking? Authorizing Provider  atorvastatin (LIPITOR) 10 MG tablet Take 1 tablet (10 mg total) by mouth daily. 05/11/16 08/09/16 Yes Troy Sine, MD  Calcium Carbonate-Vitamin D (CALCIUM + D PO) Take 1 tablet by mouth daily.    Yes Historical Provider, MD  carvedilol (COREG CR) 40 MG 24 hr capsule Take 1 capsule (40 mg total) by mouth every evening. 05/11/16  Yes Troy Sine, MD  digoxin (LANOXIN) 0.125 MG tablet Take 1 tablet (125 mcg total) by mouth daily. 05/11/16  Yes Troy Sine, MD  esomeprazole (NEXIUM) 40 MG capsule Take 1 capsule (40 mg total) by mouth 2 (two) times daily before a meal. 05/11/16  Yes Troy Sine, MD  ezetimibe (ZETIA) 10 MG tablet Take 1 tablet (10 mg total) by mouth daily. 04/08/16 07/07/16 Yes Troy Sine, MD  furosemide (LASIX) 40 MG tablet Take ( 1/2 tablet ) 20 mg daily 05/11/16  Yes Troy Sine, MD  montelukast (SINGULAIR) 10 MG tablet Take 10 mg by mouth at bedtime.   Yes Historical Provider, MD  Multiple Vitamin (MULTIVITAMIN) tablet Take 1 tablet by mouth daily.     Yes Historical Provider, MD  NON FORMULARY CPAP THERAPY   Yes Historical Provider, MD  NONFORMULARY OR COMPOUNDED ITEM Boric acid vaginal suppositories 600 mg one per vagina as needed for yeast 05/28/15  Yes Anastasio Auerbach, MD  omega-3 acid ethyl esters (LOVAZA) 1 g capsule Take 2 capsules (2 g total) by mouth 2 (two) times daily. 04/09/16  Yes Troy Sine, MD  sitaGLIPtin (JANUVIA) 100 MG tablet Take 50 mg by mouth daily.   Yes Historical Provider, MD  valsartan (DIOVAN) 160 MG tablet Take 1 tablet (160 mg total) by mouth 2 (two) times daily. 02/18/15  Yes Troy Sine, MD  zolpidem (AMBIEN) 10 MG tablet Take 10 mg by mouth at bedtime as needed. For sleep   Yes Historical Provider, MD  albuterol (PROVENTIL HFA;VENTOLIN HFA) 108 (90 BASE) MCG/ACT inhaler Inhale 2 puffs into the lungs every 4 (four) hours as needed for wheezing. 02/18/12 04/14/15  Corlis Leak, MD    Past Medical, Surgical Family and Social History reviewed and updated.    Objective:   Vitals:   06/15/16 1201  BP: (!) 102/52  Pulse: 80  Resp: 16  Temp: 99 F (37.2 C)   Wt Readings from Last 3 Encounters:  06/11/16 208 lb (94.3 kg)  04/08/16 210 lb 9.6 oz (95.5 kg)  10/10/15 216 lb (98 kg)    Physical Exam  Constitutional: She is oriented to person, place, and time. She appears well-developed and well-nourished.  HENT:  Head: Normocephalic and atraumatic.  Right Ear: External ear normal.  Left Ear: External ear normal.  Eyes: Conjunctivae are normal. Pupils are equal, round, and reactive to light.  Neck: Normal  range of motion. Neck supple. No thyromegaly present.  Cardiovascular: Normal rate, regular rhythm, normal heart sounds and intact distal pulses.   Pulmonary/Chest: Effort normal and breath sounds normal.  Abdominal: She exhibits distension. Bowel sounds are decreased. There is generalized tenderness. There is rigidity, rebound and guarding. There is negative Murphy's sign.  Excruciating abdominal tenderness with mild palpation    Musculoskeletal: Normal range of motion.  Neurological: She is alert and oriented to person, place, and time.  Skin: Skin is warm and dry.  Psychiatric: She has a normal mood and affect. Her behavior is  normal. Judgment and thought content normal.     Ct Abdomen Pelvis W Contrast  Result Date: 06/15/2016 CLINICAL DATA:  Nausea, vomiting, and abdominal pain since Sunday, leukocytosis, low-grade fever, sharp pain at the mid lateral abdomen bilaterally, past history of CHF, GERD, benign essential hypertension, diabetes mellitus EXAM: CT ABDOMEN AND PELVIS WITH CONTRAST TECHNIQUE: Multidetector CT imaging of the abdomen and pelvis was performed using the standard protocol following bolus administration of intravenous contrast. Sagittal and coronal MPR images reconstructed from axial data set. CONTRAST:  178m ISOVUE-300 IOPAMIDOL (ISOVUE-300) INJECTION 61% IV. No oral contrast administered. COMPARISON:  None. FINDINGS: Lower chest: Patchy nodular opacities at both lung bases question metastases, felt a less likely be potentially inflammatory, measuring up to 21 x 18 mm LEFT lower lobe image 4, 16 x 10 mm RIGHT lower lobe image 8, 17 x 11 mm LEFT lower lobe image 1. Additional tiny BILATERAL pulmonary nodules. No pleural effusion. Additional opacity at the LEFT lung base could represent atelectasis or mass. Hepatobiliary: Nonspecific hypervascular focus superiorly RIGHT lobe liver 10 mm diameter image 12. Nodular with low-attenuation foci RIGHT lobe liver 17 x 15 mm image 11 with  question minimal peripheral nodular enhancement and 9 mm diameter laterally image 12, nonspecific. These lesions are not definitively imaged on delayed images and are nonspecific. Gallbladder unremarkable. Pancreas: Diffuse infiltration of peripancreatic tissue planes suggestive of pancreatitis. No obvious pancreatic mass. Spleen: Normal appearance Adrenals/Urinary Tract: Adrenal glands normal appearance. Kidneys, ureters, and bladder normal appearance. Stomach/Bowel: Normal appendix. Large and small bowel loops normal appearance. Stomach decompressed, unable to exclude wall thickening. Vascular/Lymphatic: Atherosclerotic calcifications aorta and iliac arteries without aneurysm. No definite adenopathy. Reproductive: Uterus surgically absent. Nonvisualization of ovaries. Other: No free air, free fluid, or hernia. Musculoskeletal: Osseous structures unremarkable. IMPRESSION: Peripancreatic edema favoring acute pancreatitis ; recommend correlation with serum amylase. Masslike areas of soft tissue opacification at the lung bases favoring metastases, infection considered less likely ; recommend follow-up CT chest with contrast, potentially after treatment for potential infection, to exclude neoplasm/metastases. Three lesions within the liver, largest 17 x 15 mm ; these could represent atypical hemangiomas though metastatic lesions not excluded. No definite intra-abdominal or intrapelvic primary tumor identified ; however, stomach is under distended with apparent wall thickening,, consider followup endoscopy or upper GI exam to assess at exclude gastric neoplasm. Aortic atherosclerosis. These results will be called to the ordering clinician or representative by the Radiologist Assistant, and communication documented in the PACS or zVision Dashboard. Electronically Signed   By: MLavonia DanaM.D.   On: 06/15/2016 16:53   Dg Abd 2 Views  Result Date: 06/15/2016 CLINICAL DATA:  Significant abdominal pain for 3 days EXAM:  ABDOMEN - 2 VIEW COMPARISON:  03/01/2016 FINDINGS: Scattered large and small bowel gas is noted. No obstructive changes are seen. Mild fecal material is noted consistent with a degree of constipation. No acute bony abnormality is seen. IMPRESSION: Mild constipation.  No obstructive changes are noted. Electronically Signed   By: MInez CatalinaM.D.   On: 06/15/2016 10:58   Assessment & Plan:  1. Generalized abdominal pain - DG Abd 2 Views - POCT CBC - CBC with Differential/Platelet - Lipase - Amylase  Results for orders placed or performed in visit on 06/15/16  CBC with Differential/Platelet  Result Value Ref Range   WBC 20.2 (HH) 3.4 - 10.8 x10E3/uL   RBC 4.88 3.77 - 5.28 x10E6/uL   Hemoglobin 11.5 11.1 - 15.9 g/dL   Hematocrit 34.1 34.0 -  46.6 %   MCV 70 (L) 79 - 97 fL   MCH 23.6 (L) 26.6 - 33.0 pg   MCHC 33.7 31.5 - 35.7 g/dL   RDW 14.9 12.3 - 15.4 %   Platelets 230 150 - 379 x10E3/uL   Neutrophils 79 Not Estab. %   Lymphs 10 Not Estab. %   Monocytes 10 Not Estab. %   Eos 0 Not Estab. %   Basos 0 Not Estab. %   IMMATURE CELLS Note    Neutrophils Absolute 16.2 (H) 1.4 - 7.0 x10E3/uL   Lymphocytes Absolute 2.0 0.7 - 3.1 x10E3/uL   Monocytes Absolute 2.0 (H) 0.1 - 0.9 x10E3/uL   EOS (ABSOLUTE) 0.0 0.0 - 0.4 x10E3/uL   Basophils Absolute 0.0 0.0 - 0.2 x10E3/uL   Hematology Comments: Note:   Lipase  Result Value Ref Range   Lipase 624 (H) 14 - 72 U/L  Amylase  Result Value Ref Range   Amylase 1,120 (HH) 31 - 124 U/L  Immature Cells  Result Value Ref Range   Bands(Auto) Relative 1 Not Estab. %  CMP14+EGFR  Result Value Ref Range   Glucose 150 (H) 65 - 99 mg/dL   BUN 29 (H) 8 - 27 mg/dL   Creatinine, Ser 1.33 (H) 0.57 - 1.00 mg/dL   GFR calc non Af Amer 41 (L) >59 mL/min/1.73   GFR calc Af Amer 47 (L) >59 mL/min/1.73   BUN/Creatinine Ratio 22 12 - 28   Sodium 137 134 - 144 mmol/L   Potassium 5.0 3.5 - 5.2 mmol/L   Chloride 103 96 - 106 mmol/L   CO2 21 18 - 29 mmol/L    Calcium 10.1 8.7 - 10.3 mg/dL   Total Protein 7.3 6.0 - 8.5 g/dL   Albumin 4.4 3.5 - 4.8 g/dL   Globulin, Total 3.0 1.5 - 4.5 g/dL   Albumin/Globulin Ratio 18.8 (H) 1.2 - 2.2   Bilirubin Total 1.3 (H) 0.0 - 1.2 mg/dL   Alkaline Phosphatase 83 39 - 117 IU/L   AST 17 0 - 40 IU/L   ALT 15 0 - 32 IU/L  POCT CBC  Result Value Ref Range   WBC 20.2 (A) 4.6 - 10.2 K/uL   Lymph, poc 2.2 0.6 - 3.4   POC LYMPH PERCENT 10.9 10 - 50 %L   MID (cbc) 0.5 0 - 0.9   POC MID % 2.7 0 - 12 %M   POC Granulocyte 17.5 (A) 2 - 6.9   Granulocyte percent 86.4 (A) 37 - 80 %G   RBC 4.77 4.04 - 5.48 M/uL   Hemoglobin 11.4 (A) 12.2 - 16.2 g/dL   HCT, POC 34.7 (A) 37.7 - 47.9 %   MCV 72.8 (A) 80 - 97 fL   MCH, POC 23.9 (A) 27 - 31.2 pg   MCHC 32.9 31.8 - 35.4 g/dL   RDW, POC 14.3 %   Platelet Count, POC 179 142 - 424 K/uL   MPV 8.9 0 - 99.8 fL   Spoke with patient and her husband and advised of acute pancreatitis confirmed by CT scan and elevated lipase and amylase. Also advised of findings consistent with lesions bilateral lungs, nodules present on the liver (See CT Scan results).   Due to patients' age, severe abdominal pain, cardiac hx, diabetes, and  Elevated WBC, Lipase, and Amylase, I referring patient to the Emergency Department via car. Patient reports being followed by [facility] and that is where she requests to be seen.  Advised to call  upon arrival at [facility] Emergency Department for me to call report regarding the patient.   Jamie Sage. Kenton Kingfisher, MSN, FNP-C [facility]

## 2016-06-15 NOTE — Patient Instructions (Addendum)
  Appt. Today 06/15/16 at 3 pm [facility].  [address] [employer], [place] [address]                                                 Drink one bottle at 1pm, one bottle at 2pm (contrast)   IF you received an x-ray today, you will receive an invoice from [facility]. Please contact [facility] at [phone] with questions or concerns regarding your invoice.   IF you received labwork today, you will receive an invoice from [facility]. Please contact [facility] at [phone] with questions or concerns regarding your invoice.   Our billing staff will not be able to assist you with questions regarding bills from these companies.  You will be contacted with the lab results as soon as they are available. The fastest way to get your results is to activate your My Chart account. Instructions are located on the last page of this paperwork. If you have not heard from [place] regarding the results in 2 weeks, please contact this office.

## 2016-06-17 ENCOUNTER — Telehealth: Payer: Self-pay | Admitting: Family Medicine

## 2016-06-17 NOTE — Telephone Encounter (Signed)
Called patient to verify that her condition was improving while inpatient at [facility] medication center. She told me that today she is finally starting to feel a little better. She was grateful for obtaining CT scan .  Carroll Sage. Kenton Kingfisher, MSN, FNP-C [facility]

## 2016-06-30 ENCOUNTER — Ambulatory Visit (INDEPENDENT_AMBULATORY_CARE_PROVIDER_SITE_OTHER): Payer: Medicare Other | Admitting: Cardiovascular Disease

## 2016-06-30 VITALS — BP 148/70 | HR 49 | Ht 65.0 in | Wt 214.0 lb

## 2016-06-30 DIAGNOSIS — Z8719 Personal history of other diseases of the digestive system: Secondary | ICD-10-CM | POA: Diagnosis not present

## 2016-06-30 DIAGNOSIS — E782 Mixed hyperlipidemia: Secondary | ICD-10-CM | POA: Diagnosis not present

## 2016-06-30 DIAGNOSIS — I10 Essential (primary) hypertension: Secondary | ICD-10-CM

## 2016-06-30 DIAGNOSIS — G4733 Obstructive sleep apnea (adult) (pediatric): Secondary | ICD-10-CM | POA: Diagnosis not present

## 2016-06-30 DIAGNOSIS — I428 Other cardiomyopathies: Secondary | ICD-10-CM

## 2016-06-30 DIAGNOSIS — Z9989 Dependence on other enabling machines and devices: Secondary | ICD-10-CM

## 2016-06-30 DIAGNOSIS — E785 Hyperlipidemia, unspecified: Secondary | ICD-10-CM

## 2016-06-30 NOTE — Patient Instructions (Addendum)
Your physician recommends that you return for lab work.  Your physician wants you to follow-up in: 6 months or sooner if needed. You will receive a reminder letter in the mail two months in advance. If you don't receive a letter, please call our office to schedule the follow-up appointment.  If you need a refill on your cardiac medications before your next appointment, please call your pharmacy.

## 2016-07-02 ENCOUNTER — Telehealth: Payer: Self-pay | Admitting: *Deleted

## 2016-07-02 ENCOUNTER — Encounter: Payer: Self-pay | Admitting: Cardiovascular Disease

## 2016-07-02 MED ORDER — FLUCONAZOLE 150 MG PO TABS: ORAL_TABLET | ORAL | 0 refills | Status: DC

## 2016-07-02 NOTE — Telephone Encounter (Signed)
Pt informed, Rx sent. 

## 2016-07-02 NOTE — Telephone Encounter (Signed)
Pt called requesting diflucan 150 mg tablets sent to mail order pharmacy, pt was in hospital x 9 days due to her pancreatis and taking lots of antibiotics  Pt asked if could give her more than 1 pill, states you have done this is the past. Please advise

## 2016-07-02 NOTE — Telephone Encounter (Signed)
Diflucan 150 mg #3 one by mouth as needed for yeast

## 2016-07-02 NOTE — Progress Notes (Signed)
Patient ID: Jamie Ayers, female   DOB: 1946-04-02, 71 y.o.   MRN: [id number]     Primary  M.D.: Dr. Kevan Ny  HPI: Jamie Ayers is a 71 y.o. female who presents to the office for a 2 month cardiology evaluation.  .  Jamie Ayers has a remote history of a nonischemic cardiomyopathy.  In December 2005 her ejection fraction was 20%. With aggressive medical therapy LV function normalized to approximately 55%. She has a history of obstructive sleep apnea and has been utilizing CPAP therapy since 2008. 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 She also has undergone papilloma from a breast duct removal at the time of the hysterectomy.  An echo Doppler study on 03/20/2013 showed an ejection fraction at 50-55% with mild concentric left ventricular hypertrophy. She had normal diastolic function. There was evidence for mild aortic insufficiency and mild mitral regurg patient with a centrally directed jet. Her left atrium was mildly dilated. She recently had repeat laboratory done by Dr. Kevan Ny.  Laboratory on 10/05/2013 revealed hemoglobin11.6/ hematocrit 36.0.  She did have microcytic indices at 73.5.  She denies any history of sickle cell trait or thalassemia.  She is unaware of blood loss.  Glucose was elevated at 147.  She tells me in the past Dr. Marlou Sa had tried metformin but she did not tolerate this.  TSH was 1.054.  She is continuing to use CPAP with 100% compliance.  She denies daytime fatigue or awareness of breakthrough snoring.   On 01/28/2015 a follow-up echo Doppler study revealed an ejection fraction at 60-65% without wall motion abnormalities.  There was grade 1 diastolic dysfunction, mild aortic and mild mitral regurgitation.  The left atrium was mildly dilated.  Recently, she has noticed some mild myalgias in her legs.    She was seen by Dr. Percival Spanish earlier this year with complaints of  fatigue and some shortness of breath.  BNP was normal at 12.7.  Blood work by Dr. Marlou Sa from 03/02/2017revealed  a TSH of 0.65.  She had microcytic indices with an MCV of 73, hemoglobin 12.4.  Her creatinine had risen to 1.58 with a BUN of 44.  Calcium was mildly elevated at 10.9.  Saw her in follow-up one month later.  She continues to use CPAP therapy.  I was able to review a download from 08/13/2015 through 09/11/2015.  She has S9.  AutoSet CPAP ResMed unit, 95th percentile pressure is 15.5.  AHI is excellent at 0.5.  She is compliant with 97% of usage stays averaging approximately 8 hours sleep per night.    When I last saw her, she denied any episodes of chest pain or palpitations.  Repeat blood work revealed a BUN of 47 and creatinine of 1.24.  Her calcium was mildly increased at 10.8.  Lipid studies were markedly abnormal.  Apparently, she had stopped taking atorvastatin the past 2 months.  Her total cholesterol had risen to 297, triglycerides 303, HDL 39, VLDL 61, and LDL 197.  Her hemoglobin is 11.5 with hematocrit 36.1.  She has had consistent microcytosis.  TSH remained normal at 1.19.    She was recently hospitalized at [facility] with pancreatitis.  She had developed significant vomiting and nausea, and on 06/17/2016 she was seen at urgent care and was found to have an amylase of 1120 and a lipase of 624.  Her Januvia was stopped as well, as Zetia.  She has felt improved.  She underwent subsequent blood work by Dr. Marlou Sa area to continues to take valsartan 160 mg, furosemide 20 mg, shocks and 20 125 mg, and long-acting carvedilol 40 mg daily.  She is now on atorvastatin 10 mg.  She denies any PND orthopnea.  She denies palpitations.  He admits to 100% CPAP use.  She sleeping well.  She denies nocturnal palpitations.  Eyes any residual daytime sleepiness. She presents for reevaluation.   Past Medical History:  Diagnosis Date  . Anemia   . Anxiety disorder   . Asthmatic bronchitis   . Benign  essential HTN 08/06/11   ECHO-EF>55%  . CHF (congestive heart failure) (HCC)    HeFPEF  . Diabetes mellitus without complication ([place])   . Disorder of vocal cord   . GERD (gastroesophageal reflux disease)   . Hernia   . Hyperlipidemia 03/14/12   Lexiscan myoview-WNL; unchanged from previous study.  . OSA on CPAP 03/28/07   sleep study-[facility]-AHI 9.42/HR and during REM sleep @37 .50/hr. The average 02 sat duringREM AND NREM was 97.0%  . Thyroid disease   . Upper respiratory infection     Past Surgical History:  Procedure Laterality Date  . ABDOMINAL HYSTERECTOMY  2013   TAH BSO  . BREAST SURGERY     Papilloma  . DILATION AND CURETTAGE OF UTERUS    . ENDOMETRIAL BIOPSY    . HERNIA REPAIR    . HYSTEROSCOPY    . KNEE SURGERY     arthroscopic  . NISSEN FUNDOPLICATION    . OOPHORECTOMY     BSO  . Parathyroid surg    . Vocal Cord Surg      Allergies  Allergen Reactions  . Midazolam Other (See Comments)    REACTION: cardiac arrest  . Midazolam Hcl     Cardiac arrest    VERSED  . Codeine Hives and Itching  . Morphine And Related Hives  . Paba Derivatives Nausea Only    PT REPORTS SEVERE NAUSEA AFTER SURGERY, DENIES VOMITING  . Sulfonamide Derivatives Hives and Itching  . Sulfamethoxazole Hives    Current Outpatient Prescriptions  Medication Sig Dispense Refill  . albuterol (PROVENTIL HFA;VENTOLIN HFA) 108 (90 BASE) MCG/ACT inhaler Inhale 2 puffs into the lungs every 4 (four) hours as needed for wheezing. 3.7 g 0  . atorvastatin (LIPITOR) 10 MG tablet Take 1 tablet (10 mg total) by mouth daily. 90 tablet 1  . Calcium Carbonate-Vitamin D (CALCIUM + D PO) Take 1 tablet by mouth daily.     . carvedilol (COREG CR) 40 MG 24 hr capsule Take 1 capsule (40 mg total) by mouth every evening. 90 capsule 1  . cycloSPORINE (RESTASIS) 0.05 % ophthalmic emulsion Place 1 drop into both eyes daily.    . digoxin (LANOXIN) 0.125 MG tablet Take 1 tablet (125 mcg  total) by mouth daily. 90 tablet 1  . esomeprazole (NEXIUM) 40 MG capsule Take 1 capsule (40 mg total) by mouth 2 (two) times daily before a meal. 180 capsule 1  . furosemide (LASIX) 40 MG tablet Take ( 1/2 tablet ) 20 mg daily 45 tablet 1  . montelukast (SINGULAIR) 10 MG tablet Take 10 mg by mouth at bedtime.    . Multiple Vitamin (MULTIVITAMIN) tablet Take 1 tablet by mouth daily.      . NON FORMULARY CPAP THERAPY    . NONFORMULARY OR COMPOUNDED ITEM Boric acid vaginal suppositories 600 mg one per vagina as needed  for yeast 30 each 2  . Oxycodone HCl 10 MG TABS Take 1 tablet by mouth as needed.  0  . polyethylene glycol (MIRALAX / GLYCOLAX) packet Take 17 g by mouth daily.    . valsartan (DIOVAN) 160 MG tablet Take 1 tablet (160 mg total) by mouth 2 (two) times daily. 180 tablet 3  . zolpidem (AMBIEN) 10 MG tablet Take 10 mg by mouth at bedtime as needed. For sleep    . fluconazole (DIFLUCAN) 150 MG tablet Take one tablet by mouth daily as needed for yeast 3 tablet 0   No current facility-administered medications for this visit.     Social History   Social History  . Marital status: Married    Spouse name: N/A  . Number of children: N/A  . Years of education: N/A   Occupational History  . Not on file.   Social History Main Topics  . Smoking status: Never Smoker  . Smokeless tobacco: Never Used  . Alcohol use No  . Drug use: No  . Sexual activity: Yes    Birth control/ protection: Post-menopausal, Surgical     Comment: HYST-1st intercourse 71 yo-Fewer than 5 partners   Other Topics Concern  . Not on file   Social History Narrative  . No narrative on file    Family History  Problem Relation Age of Onset  . Diabetes Father   . Hypertension Father   . Cancer Father     Prostate cancer  . Arthritis Father   . Gout Father   . Hypertension Mother   . Stroke Mother   . Breast cancer Maternal Grandmother     Age 70's  . Ovarian cancer Paternal Grandmother   . Breast  cancer Sister     Age 57  . Kidney disease Sister    Social history is notable in that she is married has 4 children 9 grandchildren. There is no tobacco or alcohol use.  ROS General: Negative; No fevers, chills, or night sweats;  HEENT: Occasional hoarseness.  In the past she has had difficulty with vocal cord papillomas. No changes in vision or hearing, sinus congestion, difficulty swallowing Pulmonary: Negative; No cough, wheezing, shortness of breath, hemoptysis Cardiovascular: Negative; No chest pain, presyncope, syncope, palpitations GI: Negative; No nausea, vomiting, diarrhea, or abdominal pain GU: Negative; No dysuria, hematuria, or difficulty voiding Musculoskeletal: Positive for mild myalgias; no joint pain, or weakness Hematologic/Oncology: Negative; no easy bruising, bleeding Endocrine: Positive for diabetes mellitus.  No cold or heat intolerance. Neuro: Negative; no changes in balance, headaches Skin: Negative; No rashes or skin lesions Psychiatric: Negative; No behavioral problems, depression Sleep: Positive for sleep apnea, for which she uses CPAP every night.  No residual snoring, daytime sleepiness, hypersomnolence, bruxism, restless legs, hypnogognic hallucinations, no cataplexy Other comprehensive 14 point system review is negative.   PE BP (!) 148/70   Pulse (!) 49   Ht 5' 5"  (1.651 m)   Wt 214 lb (97.1 kg)   BMI 35.61 kg/m    The blood pressure is 130/74  Wt Readings from Last 3 Encounters:  06/30/16 214 lb (97.1 kg)  06/15/16 207 lb (93.9 kg)  06/11/16 208 lb (94.3 kg)   General: Alert, oriented, no distress.  Skin: normal turgor, no rashes HEENT: Normocephalic, atraumatic. Pupils round and reactive; sclera anicteric;no lid lag.  Nose without nasal septal hypertrophy Mouth/Parynx benign; Mallinpatti scale 3 Neck: Thick neck No JVD, no carotid bruits with normal carolti upstroke Lungs: clear to ausculatation  and percussion; no wheezing or rales Chest  wall: Nontender to palpation Heart: RRR, s1 s2 normal 2/6 systolic murmur; no diastolic murmur, rubs, thrills or heaves Abdomen: Significant central or possibly soft, nontender; no hepatosplenomehaly, BS+; abdominal aorta nontender and not dilated by palpation. Back: No CVA tenderness Pulses 2+ Extremities: no edema; no clubbing cyanosis, Homan's sign negative  Neurologic: grossly nonfocal Psychologic: Normal affect and mood  ECG (independently read by me): Sinus bradycardia with first-degree AV block.  LVH by voltage criteria.  No significant ST-T changes.  October ECG (independently read by me): Normal sinus rhythm at 61 bpm.  LVH by voltage.  Borderline first-degree AV block with PR interval 206 ms.  April 2017 ECG (independently read by me): Normal sinus rhythm at 64 bpm.  First-degree AV block with a PR interval of 218 ms.  ECG (independently read by me): Normal sinus rhythm at 68 bpm.  LVH by voltage in aVL.  July 2016 ECG (independently read by me): Normal sinus rhythm at 64 bpm..  Borderline LVH.  No cigarette ST changes.  August 2015 ECG (independently read by me): Normal sinus rhythm at 67 beats per minute.  Moderate voltage criteria for LVH.  No significant ST changes.  Borderline first degree AV block with a PR interval at 208 ms.  10/08/2013 ECG (independently read by me): Sinus rhythm at 59 beats per minute.  Borderline first-degree block with PR interval of 220 ms.  QTc interval normal.  Prior 04/17/2013 ECG: Normal sinus rhythm at 62 beats per minute. Borderline first degree AV block is no longer present with her PR interval now at 184 ms.  LABS: BMP Latest Ref Rng & Units 06/15/2016 04/05/2016 10/05/2013  Glucose 65 - 99 mg/dL 150(H) 139(H) 147(H)  BUN 8 - 27 mg/dL 29(H) 47(H) 17  Creatinine 0.57 - 1.00 mg/dL 1.33(H) 1.24(H) 0.95  BUN/Creat Ratio 12 - 28 22 - -  Sodium 134 - 144 mmol/L 137 140 141  Potassium 3.5 - 5.2 mmol/L 5.0 4.4 4.0  Chloride 96 - 106 mmol/L 103  102 102  CO2 18 - 29 mmol/L 21 28 29   Calcium 8.7 - 10.3 mg/dL 10.1 10.8(H) 9.6    Hepatic Function Latest Ref Rng & Units 06/15/2016 04/05/2016 10/05/2013  Total Protein 6.0 - 8.5 g/dL 7.3 7.7 6.9  Albumin 3.5 - 4.8 g/dL 4.4 4.4 4.1  AST 0 - 40 IU/L 17 15 14   ALT 0 - 32 IU/L 15 14 20   Alk Phosphatase 39 - 117 IU/L 83 87 86  Total Bilirubin 0.0 - 1.2 mg/dL 1.3(H) 0.7 0.8   CBC Latest Ref Rng & Units 06/15/2016 06/15/2016 04/05/2016  WBC 4.6 - 10.2 K/uL 20.2(A) 20.2(HH) 7.3  Hemoglobin 12.2 - 16.2 g/dL 11.4(A) - 11.5(L)  Hematocrit 37.7 - 47.9 % 34.7(A) 34.1 36.1  Platelets 150 - 379 x10E3/uL 230 - 264   Lab Results  Component Value Date   MCV 72.8 (A) 06/15/2016   MCV 70 (L) 06/15/2016   MCV 72.3 (L) 04/05/2016   Lab Results  Component Value Date   TSH 1.19 04/05/2016   Lab Results  Component Value Date   HGBA1C 8.4 (H) 10/08/2013   Lipid Panel     Component Value Date/Time   CHOL 297 (H) 04/05/2016 1104   TRIG 303 (H) 04/05/2016 1104   HDL 39 (L) 04/05/2016 1104   CHOLHDL 7.6 (H) 04/05/2016 1104   VLDL 61 (H) 04/05/2016 1104   LDLCALC 197 (H) 04/05/2016 1104    RADIOLOGY:  No results found.  IMPRESSION: 1. Benign essential HTN   2. Mixed hyperlipidemia   3. History of pancreatitis   4. OSA on CPAP   5. NICM (nonischemic cardiomyopathy) ([place])     ASSESSMENT AND PLAN: Ms. Cavanah is a 71 year old African-American female who has a history of morbid obesity and a history of a nonischemic cardiomyopathy with an ejection fraction of 20% which subsequently has normalized.  Her last  echo Doppler study in August 2016 continue to show normal systolic function and there was grade 1 diastolic dysfunction.  There was mild AR and mild MR.  Her left atrium was mildly dilated..  A nuclear perfusion study in September 2013 showed normal perfusion without scar or ischemia.  She has severe sleep apnea and continues to utilize her CPAP therapy with 100% compliance.  He recently  developed severe episode of pancreatitis leading to her [facility] hospitalization.  He was felt to be due to [facility] and she is no longer taking this.  He is scheduled to undergo laboratory by Dr. Marlou Sa, which include a CBC, chemistry profile, amylase and hemoglobin A1c.  I have recommended lipid panel will also be obtained.  She continues to have moderate obesity.  No signs of volume overload on exam.  Urine is now nontender.  Although initial blood pressure was mildly elevated at 140/70, repeat was 130/74.  On her medical regimen consisting of valsartan, furosemide, and carvedilol.  She is a adequate reference to sinus bradycardia 40 developed episodes of dizziness.  It may be necessary to reduce this down to 20 mg.  As long as she remains stable, I will see her in 6 months for cardiology reevaluation.   Time spent: 25 minutes  Troy Sine, MD, [facility]  07/02/2016 9:02 PM

## 2016-07-15 ENCOUNTER — Telehealth: Payer: Self-pay | Admitting: Cardiovascular Disease

## 2016-07-15 LAB — LIPID PANEL
CHOLESTEROL: 177 mg/dL (ref <200)
HDL: 36 mg/dL — AB (ref >50)
LDL Cholesterol: 104 mg/dL — ABNORMAL HIGH (ref <100)
Total CHOL/HDL Ratio: 4.9 Ratio (ref <5.0)
Triglycerides: 186 mg/dL — ABNORMAL HIGH (ref <150)
VLDL: 37 mg/dL — ABNORMAL HIGH (ref <30)

## 2016-07-15 NOTE — Telephone Encounter (Signed)
New message     Needs her results from blood work 07/14/16  Faxed to Dr Kevan Ny

## 2016-07-19 ENCOUNTER — Encounter: Payer: Self-pay | Admitting: *Deleted

## 2016-07-20 NOTE — Telephone Encounter (Signed)
Labs from 07/14/16 faxed to Dr Kevan Ny on 07/20/16. lp

## 2016-07-29 ENCOUNTER — Other Ambulatory Visit: Payer: Self-pay | Admitting: Gynecology

## 2016-07-29 DIAGNOSIS — Z1231 Encounter for screening mammogram for malignant neoplasm of breast: Secondary | ICD-10-CM

## 2016-08-04 ENCOUNTER — Ambulatory Visit: Payer: Medicare Other

## 2016-08-05 ENCOUNTER — Ambulatory Visit
Admission: RE | Admit: 2016-08-05 | Discharge: 2016-08-05 | Disposition: A | Discharge status: Home / Self Care | Payer: Medicare Other | Source: Ambulatory Visit | Attending: Gynecology | Admitting: Gynecology

## 2016-08-05 DIAGNOSIS — Z1231 Encounter for screening mammogram for malignant neoplasm of breast: Secondary | ICD-10-CM

## 2016-09-01 ENCOUNTER — Ambulatory Visit
Admission: RE | Admit: 2016-09-01 | Discharge: 2016-09-01 | Disposition: A | Discharge status: Home / Self Care | Payer: Medicare Other | Source: Ambulatory Visit | Attending: Gynecology | Admitting: Gynecology

## 2016-09-15 ENCOUNTER — Telehealth: Payer: Self-pay | Admitting: Cardiovascular Disease

## 2016-09-15 MED ORDER — FUROSEMIDE 40 MG PO TABS: ORAL_TABLET | ORAL | 1 refills | Status: DC

## 2016-09-15 NOTE — Telephone Encounter (Signed)
°*  STAT* If patient is at the pharmacy, call can be transferred to refill team.   1. Which medications need to be refilled? (please list name of each medication and dose if known) furosemide (LASIX) 40 MG tablet  2. Which pharmacy/location (including street and city if local pharmacy) is medication to be sent to?[address], [place], [place] [address]    [phone]     3. Do they need a 30 day or 90 day supply? 90 day  Patients that she received a letter from [facility], the letter stated that they did not have furosemide on hand and that she should contact doctor's office to refill,thanks.

## 2016-09-15 NOTE — Telephone Encounter (Signed)
Rx sent to pharmacy electronically-patient aware.

## 2016-10-18 ENCOUNTER — Telehealth: Payer: Self-pay | Admitting: *Deleted

## 2016-10-18 MED ORDER — NITROFURANTOIN MONOHYD MACRO 100 MG PO CAPS: 100.0000 mg | ORAL_CAPSULE | Freq: Two times a day (BID) | ORAL | 0 refills | Status: DC

## 2016-10-18 NOTE — Telephone Encounter (Signed)
Okay for Macrobid 100 mg twice a day 7 days 

## 2016-10-18 NOTE — Telephone Encounter (Signed)
Pt informed with the below note. Rx sent.

## 2016-10-18 NOTE — Telephone Encounter (Signed)
Pt called c/o UTI, chills, pressure, frequency urination, pt said she feels like she has a low grade temp. ( doesn't have thermometer to check temp) Taking tylenol, states she can't drive to come in for OV, doesn't have anyone to bring her. Pt said Macrobid was prescribed for UTI before and it helped. Please advise

## 2016-10-26 ENCOUNTER — Other Ambulatory Visit: Payer: Self-pay | Admitting: Cardiovascular Disease

## 2016-10-26 ENCOUNTER — Other Ambulatory Visit: Payer: Self-pay | Admitting: *Deleted

## 2016-10-26 MED ORDER — DIGOXIN 125 MCG PO TABS: 125.0000 ug | ORAL_TABLET | Freq: Every day | ORAL | 1 refills | Status: DC

## 2016-10-26 MED ORDER — VALSARTAN 160 MG PO TABS: 160.0000 mg | ORAL_TABLET | Freq: Two times a day (BID) | ORAL | 0 refills | Status: DC

## 2016-10-26 MED ORDER — CARVEDILOL PHOSPHATE ER 40 MG PO CP24: 40.0000 mg | ORAL_CAPSULE | Freq: Every evening | ORAL | 1 refills | Status: DC

## 2016-10-26 MED ORDER — ATORVASTATIN CALCIUM 10 MG PO TABS: 10.0000 mg | ORAL_TABLET | Freq: Every day | ORAL | 1 refills | Status: DC

## 2016-10-26 MED ORDER — VALSARTAN 160 MG PO TABS: 160.0000 mg | ORAL_TABLET | Freq: Two times a day (BID) | ORAL | 1 refills | Status: DC

## 2016-10-26 NOTE — Telephone Encounter (Signed)
New message     The pharmacy said she does not have any refills    *STAT* If patient is at the pharmacy, call can be transferred to refill team.   1. Which medications need to be refilled? (please list name of each medication and dose if known)   valsartan (DIOVAN) 160 MG tablet Take 1 tablet (160 mg total) by mouth 2 (two) times daily.     2. Which pharmacy/location (including street and city if local pharmacy) is medication to be sent to? [facility]  3. Do they need a 30 day or 90 day supply? [place]

## 2016-10-26 NOTE — Telephone Encounter (Signed)
Medication Detail    Disp Refills Start End   valsartan (DIOVAN) 160 MG tablet 180 tablet 1 10/26/2016    Sig - Route: Take 1 tablet (160 mg total) by mouth 2 (two) times daily. - Oral   E-Prescribing Status: Receipt confirmed by pharmacy (10/26/2016 10:12 AM EDT)   Pharmacy   [facility]   Rx(s) sent to pharmacy electronically to local pharmacy

## 2016-10-26 NOTE — Telephone Encounter (Signed)
New Message      *STAT* If patient is at the pharmacy, call can be transferred to refill team.   1. Which medications need to be refilled? (please list name of each medication and dose if known)   valsartan (DIOVAN) 160 MG tablet Take 1 tablet (160 mg total) by mouth 2 (two) times daily.     2. Which pharmacy/location (including street and city if local pharmacy) is medication to be sent to? [facility] on [address]   3. Do they need a 30 day or 90 day supply? Needs 2 week supply

## 2016-11-03 ENCOUNTER — Encounter: Payer: Self-pay | Admitting: Gynecology

## 2016-11-30 ENCOUNTER — Telehealth: Payer: Self-pay | Admitting: Cardiovascular Disease

## 2016-11-30 NOTE — Telephone Encounter (Signed)
Pt states that she found an empty bottle and thought that she was supposed to take. Informed pt of notation from OV noted she states that she was just making sure. She will discuss this and CoQ10 at upcoming visit 01-28-17

## 2016-11-30 NOTE — Telephone Encounter (Signed)
New message        *STAT* If patient is at the pharmacy, call can be transferred to refill team.   1. Which medications need to be refilled? (please list name of each medication and dose if known)  Spironolactone 25mg  2. Which pharmacy/location (including street and city if local pharmacy) is medication to be sent to? [facility] and [facility] 3. Do they need a 30 day or 90 day supply?  [facility] to [facility] and the rest to [facility]

## 2016-11-30 NOTE — Telephone Encounter (Signed)
Not on medication list.

## 2016-11-30 NOTE — Telephone Encounter (Signed)
Per 10-10-15 TK note: STOP spironalactone.  Increase fish oil to 2 tablets daily. Get some Co Q10 200 mg over the counter to take for the muscle discomfort.

## 2016-12-10 ENCOUNTER — Telehealth: Payer: Self-pay | Admitting: Cardiovascular Disease

## 2016-12-10 NOTE — Telephone Encounter (Signed)
New Message     Pt is having problem with sleep machine , she took it into [facility] and they said she cant get another one until November unless dr Claiborne Billings approves it

## 2016-12-10 NOTE — Telephone Encounter (Signed)
Pt notified that kelly is OOO

## 2016-12-12 NOTE — Telephone Encounter (Signed)
Okay for new machine.  She has severe sleep apnea and has been compliant with CPAP therapy over the years.

## 2016-12-13 NOTE — Telephone Encounter (Signed)
Pt notified-[facility] [phone] sx [phone]

## 2016-12-13 NOTE — Telephone Encounter (Signed)
[facility]

## 2017-01-20 ENCOUNTER — Telehealth: Payer: Self-pay | Admitting: Cardiovascular Disease

## 2017-01-20 NOTE — Telephone Encounter (Signed)
Returned call to patient no answer.LMTC. 

## 2017-01-20 NOTE — Telephone Encounter (Signed)
Pt c/o medication issue: ° °1. Name of Medication: Valsartan ° °2. How are you currently taking this medication (dosage and times per day)?  ° °3. Are you having a reaction (difficulty breathing--STAT)?  ° °4. What is your medication issue?  ° °

## 2017-01-24 NOTE — Telephone Encounter (Signed)
Attempted to return call. No answer.   Patient on valsartan 160mg  BID H/O: HTN, HF, NICM

## 2017-01-27 NOTE — Telephone Encounter (Signed)
Attempted to call pt 3 times will await call back

## 2017-01-27 NOTE — Telephone Encounter (Signed)
L/m v/m

## 2017-01-28 ENCOUNTER — Ambulatory Visit: Payer: Medicare Other | Admitting: Cardiovascular Disease

## 2017-02-14 ENCOUNTER — Ambulatory Visit (INDEPENDENT_AMBULATORY_CARE_PROVIDER_SITE_OTHER): Payer: Medicare Other | Admitting: Gynecology

## 2017-02-14 ENCOUNTER — Encounter: Payer: Self-pay | Admitting: Gynecology

## 2017-02-14 VITALS — BP 130/80 | Temp 98.4°F

## 2017-02-14 DIAGNOSIS — N390 Urinary tract infection, site not specified: Secondary | ICD-10-CM | POA: Diagnosis not present

## 2017-02-14 DIAGNOSIS — R350 Frequency of micturition: Secondary | ICD-10-CM | POA: Diagnosis not present

## 2017-02-14 DIAGNOSIS — R102 Pelvic and perineal pain: Secondary | ICD-10-CM | POA: Diagnosis not present

## 2017-02-14 DIAGNOSIS — N898 Other specified noninflammatory disorders of vagina: Secondary | ICD-10-CM

## 2017-02-14 LAB — URINALYSIS W MICROSCOPIC + REFLEX CULTURE
Bilirubin Urine: NEGATIVE
Casts: NONE SEEN [LPF]
Crystals: NONE SEEN [HPF]
Glucose, UA: NEGATIVE
Ketones, ur: NEGATIVE
Nitrite: NEGATIVE
Specific Gravity, Urine: 1.02 (ref 1.001–1.035)
Yeast: NONE SEEN [HPF]
pH: 6 (ref 5.0–8.0)

## 2017-02-14 LAB — WET PREP FOR TRICH, YEAST, CLUE
Clue Cells Wet Prep HPF POC: NONE SEEN
Trich, Wet Prep: NONE SEEN
WBC, Wet Prep HPF POC: NONE SEEN
Yeast Wet Prep HPF POC: NONE SEEN

## 2017-02-14 MED ORDER — NITROFURANTOIN MONOHYD MACRO 100 MG PO CAPS: 100.0000 mg | ORAL_CAPSULE | Freq: Two times a day (BID) | ORAL | 0 refills | Status: DC

## 2017-02-14 NOTE — Patient Instructions (Signed)
Take the antibiotic twice daily for 1 week. Follow up if your symptoms persist, worsen or recur.

## 2017-02-14 NOTE — Progress Notes (Addendum)
    Jamie Ayers 04-15-1946 [id number]        71 y.o.  [id number] presents with a history of last several weeks of feeling some lower abdominal discomfort and suprapubic pressure. Also having urinary frequency. Reports having some fevers although never took her temperature. No low back pain, urgency or dysuria. Slight vaginal discharge but no irritation or odor.  No nausea vomiting diarrhea constipation with regular bowel movements. 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  Past medical history,surgical history, problem list, medications, allergies, family history and social history were all reviewed and documented in the EPIC chart.  Directed ROS with pertinent positives and negatives documented in the history of present illness/assessment and plan.  Exam: [name] assistant Vitals:   02/14/17 1602  BP: 130/80  Temp: 98.4 F (36.9 C)  TempSrc: Oral   General appearance:  Normal Spine straight without CVA tenderness Abdomen soft nontender without masses guarding rebound Pelvic external BUS vagina with atrophic changes. Scant white discharge noted. Bimanual without masses or tenderness. Rectal exam is normal.  Assessment/Plan:  71 y.o. [name] with above history and exam. Urinalysis is consistent with an early UTI. Her prep is negative.  She does have a history of recurrent UTIs over the past several years and was treated most recently in April with Macrobid with good results. She asked if I could prescribe a 2 week supply so that she can keep a sample at home in case she has a recurrence which she will let us know before taking the antibiotic. Macrobid 100 mg twice a day 7 days #28 prescribed. Follow up if symptoms persist, worsen or recur.    Anastasio Auerbach MD, 4:20 PM 02/14/2017

## 2017-02-15 LAB — URINE CULTURE

## 2017-03-01 ENCOUNTER — Other Ambulatory Visit: Payer: Self-pay | Admitting: Cardiovascular Disease

## 2017-03-01 ENCOUNTER — Telehealth: Payer: Self-pay | Admitting: Cardiovascular Disease

## 2017-03-01 NOTE — Telephone Encounter (Signed)
REFILL 

## 2017-03-01 NOTE — Telephone Encounter (Signed)
New message        *STAT* If patient is at the pharmacy, call can be transferred to refill team.   1. Which medications need to be refilled? (please list name of each medication and dose if known)  Furosemide 40mg   2. Which pharmacy/location (including street and city if local pharmacy) is medication to be sent to? [facility] 3. Do they need a 30 day or 90 day supply? 30day

## 2017-03-02 MED ORDER — FUROSEMIDE 40 MG PO TABS
20.0000 mg | ORAL_TABLET | Freq: Every day | ORAL | 2 refills | Status: DC
Start: 2017-03-02 — End: 2017-03-18

## 2017-03-02 NOTE — Telephone Encounter (Signed)
Rx(s) sent to pharmacy electronically.  

## 2017-03-18 ENCOUNTER — Encounter: Payer: Self-pay | Admitting: Cardiovascular Disease

## 2017-03-18 ENCOUNTER — Ambulatory Visit (INDEPENDENT_AMBULATORY_CARE_PROVIDER_SITE_OTHER): Payer: Medicare Other | Admitting: Cardiovascular Disease

## 2017-03-18 VITALS — BP 177/88 | HR 64 | Ht 64.0 in | Wt 210.0 lb

## 2017-03-18 DIAGNOSIS — E668 Other obesity: Secondary | ICD-10-CM | POA: Diagnosis not present

## 2017-03-18 DIAGNOSIS — I739 Peripheral vascular disease, unspecified: Secondary | ICD-10-CM | POA: Diagnosis not present

## 2017-03-18 DIAGNOSIS — I428 Other cardiomyopathies: Secondary | ICD-10-CM

## 2017-03-18 DIAGNOSIS — I10 Essential (primary) hypertension: Secondary | ICD-10-CM

## 2017-03-18 DIAGNOSIS — G4733 Obstructive sleep apnea (adult) (pediatric): Secondary | ICD-10-CM

## 2017-03-18 DIAGNOSIS — E785 Hyperlipidemia, unspecified: Secondary | ICD-10-CM | POA: Diagnosis not present

## 2017-03-18 DIAGNOSIS — Z9989 Dependence on other enabling machines and devices: Secondary | ICD-10-CM

## 2017-03-18 DIAGNOSIS — M79605 Pain in left leg: Secondary | ICD-10-CM

## 2017-03-18 DIAGNOSIS — M79604 Pain in right leg: Secondary | ICD-10-CM

## 2017-03-18 MED ORDER — ATORVASTATIN CALCIUM 10 MG PO TABS: 10.0000 mg | ORAL_TABLET | Freq: Every day | ORAL | 3 refills | Status: DC

## 2017-03-18 MED ORDER — DIGOXIN 125 MCG PO TABS: 125.0000 ug | ORAL_TABLET | Freq: Every day | ORAL | 3 refills | Status: DC

## 2017-03-18 MED ORDER — FUROSEMIDE 40 MG PO TABS: 40.0000 mg | ORAL_TABLET | Freq: Every day | ORAL | 3 refills | Status: DC

## 2017-03-18 MED ORDER — CARVEDILOL PHOSPHATE ER 40 MG PO CP24: 40.0000 mg | ORAL_CAPSULE | Freq: Every evening | ORAL | 3 refills | Status: DC

## 2017-03-18 MED ORDER — VALSARTAN 160 MG PO TABS: 160.0000 mg | ORAL_TABLET | Freq: Two times a day (BID) | ORAL | 3 refills | Status: DC

## 2017-03-18 NOTE — Progress Notes (Addendum)
Patient ID: Jamie Ayers, female   DOB: 1946-04-12, 71 y.o.   MRN: [id number]     Primary  M.D.: Dr. Kevan Ayers  HPI: Jamie Ayers is a 71 y.o. female who presents to the office for an 8 month cardiology evaluation.  .  Ms. Jamie Ayers has a remote history of a nonischemic cardiomyopathy.  In December 2005 her ejection fraction was 20%. With aggressive medical therapy LV function normalized to approximately 55%. She has a history of obstructive sleep apnea and has been utilizing CPAP therapy since 2008. 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 She also has undergone papilloma from a breast duct removal at the time of the hysterectomy.  An echo Doppler study on 03/20/2013 showed an ejection fraction at 50-55% with mild concentric left ventricular hypertrophy. She had normal diastolic function. There was evidence for mild aortic insufficiency and mild mitral regurg patient with a centrally directed jet. Her left atrium was mildly dilated. She recently had repeat laboratory done by Dr. Kevan Ayers.  Laboratory on 10/05/2013 revealed hemoglobin11.6/ hematocrit 36.0.  She did have microcytic indices at 73.5.  She denies any history of sickle cell trait or thalassemia.  She is unaware of blood loss.  Glucose was elevated at 147.  She tells me in the past Dr. Marlou Ayers had tried metformin but she did not tolerate this.  TSH was 1.054.  She is continuing to use CPAP with 100% compliance.  She denies daytime fatigue or awareness of breakthrough snoring.   On 01/28/2015 a follow-up echo Doppler study revealed an ejection fraction at 60-65% without wall motion abnormalities.  There was grade 1 diastolic dysfunction, mild aortic and mild mitral regurgitation.  The left atrium was mildly dilated.  Recently, she has noticed some mild myalgias in her legs.    She was seen by Dr. Percival Ayers earlier this year with complaints of  fatigue and some shortness of breath.  BNP was normal at 12.7.  Blood work by Dr. Marlou Ayers from 03/02/2017revealed  a TSH of 0.65.  She had microcytic indices with an MCV of 73, hemoglobin 12.4.  Her creatinine had risen to 1.58 with a BUN of 44.  Calcium was mildly elevated at 10.9.  Saw her in follow-up one month later.  She continues to use CPAP therapy.  I was able to review a download from 08/13/2015 through 09/11/2015.  She has S9.  AutoSet CPAP ResMed unit, 95th percentile pressure is 15.5.  AHI is excellent at 0.5.  She is compliant with 97% of usage stays averaging approximately 8 hours sleep per night.    When I last saw her, she denied any episodes of chest pain or palpitations.  Repeat blood work revealed a BUN of 47 and creatinine of 1.24.  Her calcium was mildly increased at 10.8.  Lipid studies were markedly abnormal.  Apparently, she had stopped taking atorvastatin the past 2 months.  Her total cholesterol had risen to 297, triglycerides 303, HDL 39, VLDL 61, and LDL 197.  Her hemoglobin is 11.5 with hematocrit 36.1.  She has had consistent microcytosis.  TSH remained normal at 1.19.    She was hospitalized at [facility] with pancreatitis.  She had developed significant vomiting and nausea, and on 06/17/2016 she was seen at urgent care and was found to have an amylase of 1120 and a lipase of 624.  Her Januvia was stopped as well, as Zetia.  Since I last saw her, she has undergone laboratory by Dr. Marlou Ayers.  She denies any shortness of breath.  She has noticed some difficulty with her legs and feet.  She notes cramping when she walks but at times note some muscle aches, not exertionally.  She also has noticed a vague nonexertional short-lived chest sensation which resolved spontaneously.  She does note some occasional ankle swelling.  She has been using CPAP with 100% compliance and can't sleep without it.  She tells me she is potentially to get a new machine in November but will need my approval.  I  reviewed recent laboratory from January 2018 by Dr. Marlou Ayers but she tells me she had repeat blood work this week and has not yet heard the results.  She presents for evaluation.  Past Medical History:  Diagnosis Date  . Anemia   . Anxiety disorder   . Asthmatic bronchitis   . Benign essential HTN 08/06/11   ECHO-EF>55%  . CHF (congestive heart failure) (HCC)    HeFPEF  . Diabetes mellitus without complication ([place])   . Disorder of vocal cord   . GERD (gastroesophageal reflux disease)   . Hernia   . Hyperlipidemia 03/14/12   Lexiscan myoview-WNL; unchanged from previous study.  . OSA on CPAP 03/28/07   sleep study-[facility]-AHI 9.42/HR and during REM sleep '@37' .50/hr. The average 02 sat duringREM AND NREM was 97.0%  . Thyroid disease   . Upper respiratory infection     Past Surgical History:  Procedure Laterality Date  . ABDOMINAL HYSTERECTOMY  2013   TAH BSO  . BREAST EXCISIONAL BIOPSY Left   . BREAST RECONSTRUCTION Bilateral    Patient had fat pads removed from both sides   . BREAST SURGERY     Papilloma  . DILATION AND CURETTAGE OF UTERUS    . ENDOMETRIAL BIOPSY    . HERNIA REPAIR    . HYSTEROSCOPY    . KNEE SURGERY     arthroscopic  . NISSEN FUNDOPLICATION    . OOPHORECTOMY     BSO  . Parathyroid surg    . Vocal Cord Surg      Allergies  Allergen Reactions  . Midazolam Other (See Comments)    REACTION: cardiac arrest  . Midazolam Hcl     Cardiac arrest    VERSED  . Codeine Hives and Itching  . Morphine And Related Hives  . Paba Derivatives Nausea Only    PT REPORTS SEVERE NAUSEA AFTER SURGERY, DENIES VOMITING  . Sulfonamide Derivatives Hives and Itching  . Sulfamethoxazole Hives    Current Outpatient Prescriptions  Medication Sig Dispense Refill  . albuterol (PROVENTIL HFA;VENTOLIN HFA) 108 (90 BASE) MCG/ACT inhaler Inhale 2 puffs into the lungs every 4 (four) hours as needed for wheezing. 3.7 g 0  . atorvastatin (LIPITOR) 10 MG  tablet Take 1 tablet (10 mg total) by mouth daily. 90 tablet 3  . Calcium Carbonate-Vitamin D (CALCIUM + D PO) Take 1 tablet by mouth daily.     . carvedilol (COREG CR) 40 MG 24 hr capsule Take 1 capsule (40 mg total) by mouth every evening. 90 capsule 3  . cycloSPORINE (RESTASIS) 0.05 % ophthalmic emulsion Place 1 drop into both eyes daily.    . digoxin (LANOXIN) 0.125 MG tablet Take 1 tablet (125 mcg total) by mouth daily. 90 tablet 3  . esomeprazole (NEXIUM) 40 MG capsule Take 1 capsule (40 mg total) by mouth 2 (two) times daily before a  meal. 180 capsule 1  . fluconazole (DIFLUCAN) 150 MG tablet Take one tablet by mouth daily as needed for yeast 3 tablet 0  . furosemide (LASIX) 40 MG tablet Take 1 tablet (40 mg total) by mouth daily. 90 tablet 3  . montelukast (SINGULAIR) 10 MG tablet Take 10 mg by mouth at bedtime.    . Multiple Vitamin (MULTIVITAMIN) tablet Take 1 tablet by mouth daily.      . nitrofurantoin, macrocrystal-monohydrate, (MACROBID) 100 MG capsule Take 1 capsule (100 mg total) by mouth 2 (two) times daily. For 7 days 28 capsule 0  . NON FORMULARY CPAP THERAPY    . NONFORMULARY OR COMPOUNDED ITEM Boric acid vaginal suppositories 600 mg one per vagina as needed for yeast 30 each 2  . Oxycodone HCl 10 MG TABS Take 1 tablet by mouth as needed.  0  . polyethylene glycol (MIRALAX / GLYCOLAX) packet Take 17 g by mouth daily.    . valsartan (DIOVAN) 160 MG tablet Take 1 tablet (160 mg total) by mouth 2 (two) times daily. 180 tablet 3  . zolpidem (AMBIEN) 10 MG tablet Take 10 mg by mouth at bedtime as needed. For sleep     No current facility-administered medications for this visit.     Social History   Social History  . Marital status: Married    Spouse name: N/A  . Number of children: N/A  . Years of education: N/A   Occupational History  . Not on file.   Social History Main Topics  . Smoking status: Never Smoker  . Smokeless tobacco: Never Used  . Alcohol use No  .  Drug use: No  . Sexual activity: Yes    Birth control/ protection: Post-menopausal, Surgical     Comment: HYST-1st intercourse 71 yo-Fewer than 5 partners   Other Topics Concern  . Not on file   Social History Narrative  . No narrative on file    Family History  Problem Relation Age of Onset  . Diabetes Father   . Hypertension Father   . Cancer Father        Prostate cancer  . Arthritis Father   . Gout Father   . Hypertension Mother   . Stroke Mother   . Breast cancer Maternal Grandmother        Age 35's  . Ovarian cancer Paternal Grandmother   . Breast cancer Sister        Age 3  . Kidney disease Sister    Social history is notable in that she is married has 4 children 9 grandchildren. There is no tobacco or alcohol use.  ROS General: Negative; No fevers, chills, or night sweats;  HEENT: Occasional hoarseness.  In the past she has had difficulty with vocal cord papillomas. No changes in vision or hearing, sinus congestion, difficulty swallowing Pulmonary: Negative; No cough, wheezing, shortness of breath, hemoptysis Cardiovascular: Negative; No chest pain, presyncope, syncope, palpitations GI: Negative; No nausea, vomiting, diarrhea, or abdominal pain GU: Negative; No dysuria, hematuria, or difficulty voiding Musculoskeletal: Positive for mild myalgias; no joint pain, or weakness Hematologic/Oncology: Negative; no easy bruising, bleeding Endocrine: Positive for diabetes mellitus.  No cold or heat intolerance. Neuro: Negative; no changes in balance, headaches Skin: Negative; No rashes or skin lesions Psychiatric: Negative; No behavioral problems, depression Sleep: Positive for sleep apnea, for which she uses CPAP every night.  No residual snoring, daytime sleepiness, hypersomnolence, bruxism, restless legs, hypnogognic hallucinations, no cataplexy Other comprehensive 14 point system review is  negative.   PE BP (!) 177/88   Pulse 64   Ht '5\' 4"'  (1.626 m)   Wt 210 lb  (95.3 kg)   BMI 36.05 kg/m    The blood pressure was 148/84 in the right arm and 138/80 in the left arm using a large cuff  Wt Readings from Last 3 Encounters:  03/18/17 210 lb (95.3 kg)  06/30/16 214 lb (97.1 kg)  06/15/16 207 lb (93.9 kg)   General: Alert, oriented, no distress.  Skin: normal turgor, no rashes, warm and dry HEENT: Normocephalic, atraumatic. Pupils equal round and reactive to light; sclera anicteric; extraocular muscles intact;  Nose without nasal septal hypertrophy Mouth/Parynx benign; Mallinpatti scale 3 Neck: No JVD, no carotid bruits; normal carotid upstroke Lungs: clear to ausculatation and percussion; no wheezing or rales Chest wall: without tenderness to palpitation Heart: PMI not displaced, RRR, s1 s2 normal, 2/6 systolic murmur, no diastolic murmur, no rubs, gallops, thrills, or heaves Abdomen: soft, nontender; no hepatosplenomehaly, BS+; abdominal aorta nontender and not dilated by palpation. Back: no CVA tenderness Pulses 2+; femoral pulses are hard to palpate.  Distal pulses were present but questionably diminished. Musculoskeletal: full range of motion, normal strength, no joint deformities Extremities: Trace edema at ankles no clubbing cyanosis, Homan's sign negative  Neurologic: grossly nonfocal; Cranial nerves grossly wnl Psychologic: Normal mood and affect   ECG (independently read by me): Normal sinus rhythm at 64 bpm.  First-degree AV block.  PR interval 218 ms.  LVH by voltage criteria in aVL.  January 2018 ECG (independently read by me): Sinus bradycardia with first-degree AV block.  LVH by voltage criteria.  No significant ST-T changes.  October 2017 ECG (independently read by me): Normal sinus rhythm at 61 bpm.  LVH by voltage.  Borderline first-degree AV block with PR interval 206 ms.  April 2017 ECG (independently read by me): Normal sinus rhythm at 64 bpm.  First-degree AV block with a PR interval of 218 ms.  ECG (independently read by  me): Normal sinus rhythm at 68 bpm.  LVH by voltage in aVL.  July 2016 ECG (independently read by me): Normal sinus rhythm at 64 bpm..  Borderline LVH.  No cigarette ST changes.  August 2015 ECG (independently read by me): Normal sinus rhythm at 67 beats per minute.  Moderate voltage criteria for LVH.  No significant ST changes.  Borderline first degree AV block with a PR interval at 208 ms.  10/08/2013 ECG (independently read by me): Sinus rhythm at 59 beats per minute.  Borderline first-degree block with PR interval of 220 ms.  QTc interval normal.  Prior 04/17/2013 ECG: Normal sinus rhythm at 62 beats per minute. Borderline first degree AV block is no longer present with her PR interval now at 184 ms.  LABS: BMP Latest Ref Rng & Units 06/15/2016 04/05/2016 10/05/2013  Glucose 65 - 99 mg/dL 150(H) 139(H) 147(H)  BUN 8 - 27 mg/dL 29(H) 47(H) 17  Creatinine 0.57 - 1.00 mg/dL 1.33(H) 1.24(H) 0.95  BUN/Creat Ratio 12 - 28 22 - -  Sodium 134 - 144 mmol/L 137 140 141  Potassium 3.5 - 5.2 mmol/L 5.0 4.4 4.0  Chloride 96 - 106 mmol/L 103 102 102  CO2 18 - 29 mmol/L '21 28 29  ' Calcium 8.7 - 10.3 mg/dL 10.1 10.8(H) 9.6    Hepatic Function Latest Ref Rng & Units 06/15/2016 04/05/2016 10/05/2013  Total Protein 6.0 - 8.5 g/dL 7.3 7.7 6.9  Albumin 3.5 - 4.8 g/dL 4.4 4.4  4.1  AST 0 - 40 IU/L '17 15 14  ' ALT 0 - 32 IU/L '15 14 20  ' Alk Phosphatase 39 - 117 IU/L 83 87 86  Total Bilirubin 0.0 - 1.2 mg/dL 1.3(H) 0.7 0.8   CBC Latest Ref Rng & Units 06/15/2016 06/15/2016 04/05/2016  WBC 4.6 - 10.2 K/uL 20.2(A) 20.2(HH) 7.3  Hemoglobin 12.2 - 16.2 g/dL 11.4(A) 11.5 11.5(L)  Hematocrit 37.7 - 47.9 % 34.7(A) 34.1 36.1  Platelets 150 - 379 x10E3/uL 230 - 264   Lab Results  Component Value Date   MCV 72.8 (A) 06/15/2016   MCV 70 (L) 06/15/2016   MCV 72.3 (L) 04/05/2016   Lab Results  Component Value Date   TSH 1.19 04/05/2016   Lab Results  Component Value Date   HGBA1C 8.4 (H) 10/08/2013    Lipid Panel     Component Value Date/Time   CHOL 177 07/14/2016 1031   TRIG 186 (H) 07/14/2016 1031   HDL 36 (L) 07/14/2016 1031   CHOLHDL 4.9 07/14/2016 1031   VLDL 37 (H) 07/14/2016 1031   LDLCALC 104 (H) 07/14/2016 1031    RADIOLOGY: No results found.  IMPRESSION: 1. NICM (nonischemic cardiomyopathy) ([place])   2. Benign essential HTN   3. Claudication ([place])   4. OSA on CPAP   5. Hyperlipidemia, unspecified hyperlipidemia type   6. Moderate obesity   7. Pain in both lower extremities     ASSESSMENT AND PLAN: Ms. Carrigg is a 71 year old African-American female who has a history of morbid obesity and a history of a nonischemic cardiomyopathy with an ejection fraction of 20% which subsequently has normalized.  Her last  echo Doppler study in August 2016 continue to show normal systolic function with an EF of 60 to 60% and there was grade 1 diastolic dysfunction.  There was mild AR and mild MR.  Her left atrium was mildly dilated..  A nuclear perfusion study in September 2013 showed normal perfusion without scar or ischemia.  She has severe sleep apnea and continues to utilize her CPAP therapy with 100% compliance.  She states that she is due to potentially to get a new machine in November.  She has a CPAP auto unit.  And we will prescribe a ResMed air since 10 auto CPAP treatment.  We will need to find her minimum pressure and this can be increased to 20 cm pressure.  If necessary.  Her chief complaint today is that of leg discomfort, particularly with walking.  I am scheduling her for lower extremity arterial Doppler imaging to assess lower extremity circulation to make certain this is not having claudication symptomatology.  I have review blood work by Dr. Marlou Ayers from January 2018 and will try to obtain the blood work from this past week.  I am recommending she increase furosemide to 40 mg daily from her present dose of 20 mg, which will be helpful both for blood pressure as well as ankle  swelling.  She continues to be on valsartan 160 mg twice a day and received a new prescription from her pharmaceutical company and was advised to discontinue her prior valsartan prescription..  Presently, her lungs are clear.  There is no overt CHF.  She continues to be on atorvastatin for hyperlipidemia.  There is no wheezing on Singulair.  I will see her in approximately 2 months after she gets a new CPAP machine per Medicare requirements for compliance evaluation.    Time spent: 25 minutes  Troy Sine, MD, [facility]  03/18/2017 12:36 PM

## 2017-03-18 NOTE — Patient Instructions (Addendum)
Medication Instructions:  INCREASE furosemide (Lasix) to 40mg  daily (1 tablet)  Testing/Procedures: Your physician has requested that you have a lower extremity arterial duplex. This test is an ultrasound of the arteries in the legs. It looks at arterial blood flow in the legs. Allow one hour for Lower Arterial scans. There are no restrictions or special instructions  Follow-Up: Your physician wants you to follow-up in: January or February with Dr. Claiborne Billings. . You will receive a reminder letter in the mail two months in advance. If you don't receive a letter, please call our office to schedule the follow-up appointment.   Any Other Special Instructions Will Be Listed Below (If Applicable).  We will contact [place] with orders for new CPAP machine   If you need a refill on your cardiac medications before your next appointment, please call your pharmacy.

## 2017-03-21 ENCOUNTER — Other Ambulatory Visit: Payer: Self-pay | Admitting: Cardiovascular Disease

## 2017-03-21 ENCOUNTER — Ambulatory Visit (HOSPITAL_COMMUNITY)
Admission: RE | Admit: 2017-03-21 | Discharge: 2017-03-21 | Disposition: A | Discharge status: Home / Self Care | Payer: Medicare Other | Source: Ambulatory Visit | Attending: Cardiology | Admitting: Cardiology

## 2017-03-21 DIAGNOSIS — I739 Peripheral vascular disease, unspecified: Secondary | ICD-10-CM | POA: Diagnosis not present

## 2017-03-29 ENCOUNTER — Telehealth: Payer: Self-pay | Admitting: *Deleted

## 2017-03-29 NOTE — Telephone Encounter (Signed)
Orders for new CPAP machine faxed to [facility]

## 2017-04-12 ENCOUNTER — Telehealth: Payer: Self-pay | Admitting: Cardiovascular Disease

## 2017-04-12 MED ORDER — OMEGA-3-ACID ETHYL ESTERS 1 G PO CAPS: 2.0000 g | ORAL_CAPSULE | Freq: Two times a day (BID) | ORAL | 3 refills | Status: DC

## 2017-04-12 NOTE — Telephone Encounter (Signed)
I spoke with pt she states that Dr Claiborne Billings has always rx'd known) Omega 3 acid- ethylesters. She states that mail order needs new rx sent. Informed pt that he is at the hospital this week but will be in the office next week, she states that is ok. Will forward to Dr Claiborne Billings

## 2017-04-12 NOTE — Telephone Encounter (Signed)
Lm2cb(DPR) I do not see on any recent medication list. I think that this is [place].

## 2017-04-12 NOTE — Telephone Encounter (Signed)
New message    Patient calling requesting refill on Omega 3 acid, ethylesters 1000 mg twice a day.    *STAT* If patient is at the pharmacy, call can be transferred to refill team.   1. Which medications need to be refilled? (please list name of each medication and dose if known) Omega 3 acid- ethylesters   2. Which pharmacy/location (including street and city if local pharmacy) is medication to be sent to? [facility] mail  3. Do they need a 30 day or 90 day supply? [place]

## 2017-04-14 NOTE — Telephone Encounter (Signed)
Okay to renew her prescription

## 2017-04-15 NOTE — Telephone Encounter (Signed)
Rx sent 

## 2017-05-02 ENCOUNTER — Other Ambulatory Visit: Payer: Self-pay

## 2017-05-02 MED ORDER — FUROSEMIDE 40 MG PO TABS: 40.0000 mg | ORAL_TABLET | Freq: Every day | ORAL | 3 refills | Status: DC

## 2017-05-02 MED ORDER — CARVEDILOL PHOSPHATE ER 40 MG PO CP24: 40.0000 mg | ORAL_CAPSULE | Freq: Every evening | ORAL | 3 refills | Status: DC

## 2017-05-02 MED ORDER — DIGOXIN 125 MCG PO TABS: 125.0000 ug | ORAL_TABLET | Freq: Every day | ORAL | 3 refills | Status: DC

## 2017-05-02 MED ORDER — OMEGA-3-ACID ETHYL ESTERS 1 G PO CAPS: 2.0000 g | ORAL_CAPSULE | Freq: Two times a day (BID) | ORAL | 3 refills | Status: DC

## 2017-05-02 MED ORDER — ATORVASTATIN CALCIUM 20 MG PO TABS: 20.0000 mg | ORAL_TABLET | Freq: Every day | ORAL | 3 refills | Status: DC

## 2017-06-10 ENCOUNTER — Telehealth: Payer: Self-pay | Admitting: Cardiology

## 2017-06-10 NOTE — Telephone Encounter (Signed)
Sent #[id number] rx w/3 rf 05-02-17 left detailed message to call back if any other questions

## 2017-06-10 NOTE — Telephone Encounter (Signed)
New Message    *STAT* If patient is at the pharmacy, call can be transferred to refill team.   1. Which medications need to be refilled? (please list name of each medication and dose if known) carvedilol  2. Which pharmacy/location (including street and city if local pharmacy) is medication to be sent to? [facility]    3. Do they need a 30 day or 90 day supply? [place]

## 2017-06-15 ENCOUNTER — Encounter: Payer: Self-pay | Admitting: Gynecology

## 2017-06-15 ENCOUNTER — Ambulatory Visit (INDEPENDENT_AMBULATORY_CARE_PROVIDER_SITE_OTHER): Payer: Medicare Other | Admitting: Gynecology

## 2017-06-15 ENCOUNTER — Telehealth: Payer: Self-pay | Admitting: *Deleted

## 2017-06-15 VITALS — BP 134/80 | Ht 64.0 in | Wt 213.0 lb

## 2017-06-15 DIAGNOSIS — N952 Postmenopausal atrophic vaginitis: Secondary | ICD-10-CM

## 2017-06-15 DIAGNOSIS — Z01411 Encounter for gynecological examination (general) (routine) with abnormal findings: Secondary | ICD-10-CM

## 2017-06-15 DIAGNOSIS — Z87898 Personal history of other specified conditions: Secondary | ICD-10-CM

## 2017-06-15 NOTE — Telephone Encounter (Signed)
-----   Message from Anastasio Auerbach, MD sent at 06/15/2017  9:58 AM EST ----- Appointment with [facility] genetic counseling reference family history of cancers.  Patient with many questions about her risks

## 2017-06-15 NOTE — Telephone Encounter (Signed)
Referral placed in epic at genetic counselor  they will contact pt to schedule, pt aware.

## 2017-06-15 NOTE — Progress Notes (Signed)
    BLIMI GODBY [birth year] [id number]        71 y.o.  [id number] for breast and pelvic exam.  Doing well without complaints.  History of abdominal discomfort in August but this is totally resolved with antibiotic treatment.  Past medical history,surgical history, problem list, medications, allergies, family history and social history were all reviewed and documented as reviewed in the EPIC chart.  ROS:  Performed with pertinent positives and negatives included in the history, assessment and plan.   Additional significant findings : None   Exam: Wandra Scot assistant Vitals:   06/15/17 0928  BP: 134/80  Weight: 213 lb (96.6 kg)  Height: 5\' 4"  (1.626 m)   Body mass index is 36.56 kg/m.  General appearance:  Normal affect, orientation and appearance. Skin: Grossly normal HEENT: Without gross lesions.  No cervical or supraclavicular adenopathy. Thyroid normal.  Lungs:  Clear without wheezing, rales or rhonchi Cardiac: RR, without RMG Abdominal:  Soft, nontender, without masses, guarding, rebound, organomegaly or hernia Breasts:  Examined lying and sitting without masses, retractions, discharge or axillary adenopathy. Pelvic:  Ext, BUS, Vagina: With atrophic changes  Adnexa: Without masses or tenderness    Anus and perineum: Normal   Rectovaginal: Normal sphincter tone without palpated masses or tenderness.    Assessment/Plan:  71 y.o. [id number] female for breast and pelvic exam.   1. Postmenopausal/atrophic genital changes.  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  No significant hot flushes, night sweats or vaginal dryness. 2. Pap smear 05/2015.  No Pap smear done today.  No history of significant abnormal Pap smears.  Options to stop screening per current screening guidelines based on age and hysterectomy history reviewed.  Will readdress on an annual basis. 3. Mammography 08/2016.  Continue with annual mammography beginning of next year.  Breast exam normal  today. 4. Colonoscopy 2010.  Patient gives different years each year when we ask her as to when her last colonoscopy was.  She is going to call her gastroenterologist to confirm when she is due to have a repeat colonoscopy. 5. DEXA 2015 normal.  Plan repeat DEXA at 5-year interval. 6. Health maintenance.  No routine lab work done as patient does this elsewhere.  She does relate having breast cancer in a maternal grandmother, sister and cousins on her mother side and a history of ovarian cancer in her father's mother.  She had many questions as far as her risk for cancers in general.  I recommended she make an appointment to talk to a genetic counselor and we will make arrangements for this.  She knows to call if she does not hear from their office to make this appointment.  Otherwise follow-up in 1 year, sooner as needed.   Anastasio Auerbach MD, 9:54 AM 06/15/2017

## 2017-06-15 NOTE — Patient Instructions (Signed)
Genetic counseling office will call you to arrange an appointment to discuss your cancer family history questions.  If you do not hear from their office within the next several weeks call my office.  Follow-up in 1 year for annual exam.

## 2017-06-16 ENCOUNTER — Telehealth: Payer: Self-pay | Admitting: *Deleted

## 2017-06-16 NOTE — Telephone Encounter (Addendum)
Notification from [facility]  Set up date: 06/13/17  Airsense 10 CPAP Auto 8-20 cm H20  Mask: F20 for her-medium  Appt is currently scheduled 1/9-will need to reschedule atleast 30 days after set up date.    Message sent to scheduler.

## 2017-06-17 ENCOUNTER — Encounter: Payer: Medicare Other | Admitting: Gynecology

## 2017-06-18 ENCOUNTER — Telehealth: Payer: Self-pay | Admitting: Genetic Counselor

## 2017-06-18 NOTE — Telephone Encounter (Signed)
06/18/17 @ 9:07 am called and left message with patient to confirm appointment with [name] for genetic counseling on 08/03/17 @ 1:00.  Patient is aware to arrive 30 minutes prior to appointment to register.

## 2017-06-20 ENCOUNTER — Telehealth: Payer: Self-pay | Admitting: Cardiovascular Disease

## 2017-06-20 NOTE — Telephone Encounter (Signed)
Closed encounter °

## 2017-06-20 NOTE — Telephone Encounter (Signed)
Pt scheduled on 08/03/17

## 2017-06-29 ENCOUNTER — Ambulatory Visit: Payer: Medicare Other | Admitting: Cardiovascular Disease

## 2017-08-03 ENCOUNTER — Encounter: Payer: Self-pay | Admitting: Genetic Counselor

## 2017-08-03 ENCOUNTER — Other Ambulatory Visit: Payer: Medicare Other

## 2017-08-03 ENCOUNTER — Encounter: Payer: Medicare Other | Admitting: Genetic Counselor

## 2017-08-09 ENCOUNTER — Other Ambulatory Visit: Payer: Self-pay | Admitting: Cardiovascular Disease

## 2017-08-09 MED ORDER — VALSARTAN 160 MG PO TABS: 160.0000 mg | ORAL_TABLET | Freq: Two times a day (BID) | ORAL | 0 refills | Status: DC

## 2017-08-09 NOTE — Telephone Encounter (Signed)
Rx(s) sent to pharmacy electronically.  

## 2017-08-09 NOTE — Telephone Encounter (Signed)
NEW MESSAGE   Mail order delayed, patient request to send to local pharmacy. Patient is out of medication   *STAT* If patient is at the pharmacy, call can be transferred to refill team.   1. Which medications need to be refilled? (please list name of each medication and dose if known) valsartan (DIOVAN) 160 MG tablet  2. Which pharmacy/location (including street and city if local pharmacy) is medication to be sent to?[facility] ([place]),  - [address]  3. Do they need a 30 day or 90 day supply?  [place]

## 2017-08-22 ENCOUNTER — Other Ambulatory Visit: Payer: Self-pay

## 2017-08-22 MED ORDER — NONFORMULARY OR COMPOUNDED ITEM: 0 refills | Status: DC

## 2017-08-22 NOTE — Telephone Encounter (Signed)
Rx called in 

## 2017-08-22 NOTE — Telephone Encounter (Signed)
Received refill for boric acid suppositories.  This has not been addressed over the last several years office notes.  Is there an issue that she is dealing with now and asking for a refill and should she have an office visit first for evaluation?

## 2017-08-22 NOTE — Telephone Encounter (Signed)
Left message on cell phone voice mail for patient to call me.

## 2017-08-22 NOTE — Telephone Encounter (Signed)
I spoke with patient. She said she has been using these for years since Dr. Darnell Level prescribed them for her. She said she will get an odor and she uses them at bedtime for 3 days and it is resolved. She said Dr. Darnell Level told her the [facility] in her vagina was off when that happened.  She said the bottle will last her a whole year. The one she just finished using today was dated 09/2015.

## 2017-08-22 NOTE — Telephone Encounter (Signed)
Okay to refill boric acid vaginal suppositories 600 mg #30, one per vagina daily as needed for vaginal irritation

## 2017-08-22 NOTE — Telephone Encounter (Signed)
Left message for patient that Rx sent.

## 2017-08-29 ENCOUNTER — Encounter: Payer: Self-pay | Admitting: Cardiovascular Disease

## 2017-08-29 ENCOUNTER — Ambulatory Visit (INDEPENDENT_AMBULATORY_CARE_PROVIDER_SITE_OTHER): Payer: Medicare Other | Admitting: Cardiovascular Disease

## 2017-08-29 VITALS — BP 142/77 | HR 64 | Ht 65.0 in | Wt 225.0 lb

## 2017-08-29 DIAGNOSIS — Z9989 Dependence on other enabling machines and devices: Secondary | ICD-10-CM | POA: Diagnosis not present

## 2017-08-29 DIAGNOSIS — M79604 Pain in right leg: Secondary | ICD-10-CM

## 2017-08-29 DIAGNOSIS — I428 Other cardiomyopathies: Secondary | ICD-10-CM | POA: Diagnosis not present

## 2017-08-29 DIAGNOSIS — G4733 Obstructive sleep apnea (adult) (pediatric): Secondary | ICD-10-CM | POA: Diagnosis not present

## 2017-08-29 DIAGNOSIS — M79605 Pain in left leg: Secondary | ICD-10-CM | POA: Diagnosis not present

## 2017-08-29 DIAGNOSIS — E785 Hyperlipidemia, unspecified: Secondary | ICD-10-CM

## 2017-08-29 MED ORDER — ZOLPIDEM TARTRATE 10 MG PO TABS: 10.0000 mg | ORAL_TABLET | Freq: Every evening | ORAL | 3 refills | Status: AC | PRN

## 2017-08-29 NOTE — Progress Notes (Signed)
Patient ID: Jamie Ayers, female   DOB: Sep 17, 1945, 72 y.o.   MRN: [id number]     Primary  M.D.: Jamie Ayers  HPI: Jamie Ayers is a 72 y.o. female who presents to the office for a 6 month cardiology evaluation.  .  Jamie Ayers has a remote history of a nonischemic cardiomyopathy.  In December 2005 her ejection fraction was 20%. With aggressive medical therapy LV function normalized to approximately 55%. She has a history of obstructive sleep apnea and has been utilizing CPAP therapy since 2008. 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 She also has undergone papilloma from a breast duct removal at the time of the hysterectomy.  An echo Doppler study on 03/20/2013 showed an ejection fraction at 50-55% with mild concentric left ventricular hypertrophy. She had normal diastolic function. There was evidence for mild aortic insufficiency and mild mitral regurg patient with a centrally directed jet. Her left atrium was mildly dilated. She recently had repeat laboratory done by Jamie Ayers.  Laboratory on 10/05/2013 revealed hemoglobin11.6/ hematocrit 36.0.  She did have microcytic indices at 73.5.  She denies any history of sickle cell trait or thalassemia.  She is unaware of blood loss.  Glucose was elevated at 147.  She tells me in the past Jamie Ayers had tried metformin but she did not tolerate this.  TSH was 1.054.  She is continuing to use CPAP with 100% compliance.  She denies daytime fatigue or awareness of breakthrough snoring.   On 01/28/2015 a follow-up echo Doppler study revealed an ejection fraction at 60-65% without wall motion abnormalities.  There was grade 1 diastolic dysfunction, mild aortic and mild mitral regurgitation.  The left atrium was mildly dilated.  Recently, she has noticed some mild myalgias in her legs.    She was seen by Jamie Ayers in 2017 with complaints of fatigue and  some shortness of breath.  BNP was normal at 12.7.  Blood work by Jamie Ayers from 03/02/2017revealed  a TSH of 0.65.  She had microcytic indices with an MCV of 73, hemoglobin 12.4.  Her creatinine had risen to 1.58 with a BUN of 44.  Calcium was mildly elevated at 10.9.  Saw her in follow-up one month later.  She continues to use CPAP therapy.  I was able to review a download from 08/13/2015 through 09/11/2015.  She has S9.  AutoSet CPAP ResMed unit, 95th percentile pressure is 15.5.  AHI is excellent at 0.5.  She is compliant with 97% of usage stays averaging approximately 8 hours sleep per night.    Repeat blood work revealed a BUN of 47 and creatinine of 1.24.  Her calcium was mildly increased at 10.8.  Lipid studies were markedly abnormal.  Apparently, she had stopped taking atorvastatin the past 2 months.  Her total cholesterol had risen to 297, triglycerides 303, HDL 39, VLDL 61, and LDL 197.  Her hemoglobin is 11.5 with hematocrit 36.1.  She has had consistent microcytosis.  TSH remained normal at 1.19.    She was hospitalized at [facility] with pancreatitis.  She had developed significant vomiting and nausea, and on 06/17/2016 she was seen at urgent care and was found to have an amylase of 1120 and a lipase of 624.  Her Januvia was stopped as well, as Zetia.   I last saw her she denied any chest pain or  shortness of breath.  She has noticed some difficulty with her legs and feet.  She notes cramping when she walks but at times note some muscle aches, not exertionally.  She also has noticed a vague nonexertional short-lived chest sensation which resolved spontaneously.  She does note some occasional ankle swelling.  She has been using CPAP with 100% compliance and can't sleep without it.    She received a new CPAP machine on June 13, 2017.  A download was obtained in the office today from February 10 - August 29, 2017.  She is meeting Medicare compliance standards with 90% of usage days.  Usage greater  than 4 hours was 26 days or 87%.  Days that she did not use her machine was when she had received a soap clean machine and not realize when she was out of town the reservoir requirement.  She is averaging 8 hours and 30 minutes of CPAP use per night.  She has been set on a auto CPAP minimum pressure of 8 and a maximum of 20.  AHI is 0.6.  There is no significant leak.  She is requiring high pressures with 95th percentile pressure at 18.2 with maximum average pressure at 19.2.  She is unaware of breakthrough snoring.  Her sleep is restorative.  She continues to experience times sleepiness and Epworth sleepiness scale score was endorsed today and calculated at 18.  Has difficulty with sleep initiation.  Apically she goes to bed at 11 PM but cannot fall asleep for several hours and at times is still awake at 2 AM.  He has a peripheral neuropathy.  She has not been routinely exercising.  She presents for evaluation.   Past Medical History:  Diagnosis Date  . Anemia   . Anxiety disorder   . Asthmatic bronchitis   . Benign essential HTN 08/06/11   ECHO-EF>55%  . CHF (congestive heart failure) (HCC)    HeFPEF  . Diabetes mellitus without complication ([place])   . Disorder of vocal cord   . GERD (gastroesophageal reflux disease)   . Hernia   . Hyperlipidemia 03/14/12   Lexiscan myoview-WNL; unchanged from previous study.  . OSA on CPAP 03/28/07   sleep study-[facility]-AHI 9.42/HR and during REM sleep '@37' .50/hr. The average 02 sat duringREM AND NREM was 97.0%  . Thyroid disease   . Upper respiratory infection     Past Surgical History:  Procedure Laterality Date  . ABDOMINAL HYSTERECTOMY  2013   TAH BSO  . BREAST EXCISIONAL BIOPSY Left   . BREAST RECONSTRUCTION Bilateral    Patient had fat pads removed from both sides   . BREAST SURGERY     Papilloma  . DILATION AND CURETTAGE OF UTERUS    . ENDOMETRIAL BIOPSY    . HERNIA REPAIR    . HYSTEROSCOPY    . KNEE SURGERY      arthroscopic  . NISSEN FUNDOPLICATION    . OOPHORECTOMY     BSO  . Parathyroid surg    . Vocal Cord Surg      Allergies  Allergen Reactions  . Midazolam Other (See Comments)    REACTION: cardiac arrest  . Midazolam Hcl     Cardiac arrest    VERSED  . Codeine Hives and Itching  . Morphine And Related Hives  . Paba Derivatives Nausea Only    PT REPORTS SEVERE NAUSEA AFTER SURGERY, DENIES VOMITING  . Sulfonamide Derivatives Hives and Itching  . Sulfamethoxazole Hives    Current Outpatient Medications  Medication Sig Dispense Refill  . albuterol (PROVENTIL HFA;VENTOLIN HFA) 108 (90 BASE) MCG/ACT inhaler Inhale 2 puffs into the lungs every 4 (four) hours as needed for wheezing. 3.7 g 0  . atorvastatin (LIPITOR) 20 MG tablet Take 1 tablet (20 mg total) daily by mouth. 90 tablet 3  . Calcium Carbonate-Vitamin D (CALCIUM + D PO) Take 1 tablet by mouth daily.     . carvedilol (COREG CR) 40 MG 24 hr capsule Take 1 capsule (40 mg total) every evening by mouth. 90 capsule 3  . cycloSPORINE (RESTASIS) 0.05 % ophthalmic emulsion Place 1 drop into both eyes daily.    . digoxin (LANOXIN) 0.125 MG tablet Take 1 tablet (125 mcg total) daily by mouth. 90 tablet 3  . esomeprazole (NEXIUM) 40 MG capsule Take 1 capsule (40 mg total) by mouth 2 (two) times daily before a meal. 180 capsule 1  . furosemide (LASIX) 40 MG tablet Take 1 tablet (40 mg total) daily by mouth. 90 tablet 3  . montelukast (SINGULAIR) 10 MG tablet Take 10 mg by mouth at bedtime.    . Multiple Vitamin (MULTIVITAMIN) tablet Take 1 tablet by mouth daily.      . nitrofurantoin, macrocrystal-monohydrate, (MACROBID) 100 MG capsule Take 1 capsule (100 mg total) by mouth 2 (two) times daily. For 7 days (Patient taking differently: Take 100 mg by mouth daily. For 7 days) 28 capsule 0  . NON FORMULARY CPAP THERAPY    . NONFORMULARY OR COMPOUNDED ITEM Boric acid vaginal suppositories 600 mg one per vagina as needed for yeast 30 each 0  .  omega-3 acid ethyl esters (LOVAZA) 1 g capsule Take 2 capsules (2 g total) 2 (two) times daily by mouth. 360 capsule 3  . Oxycodone HCl 10 MG TABS Take 1 tablet by mouth as needed.  0  . polyethylene glycol (MIRALAX / GLYCOLAX) packet Take 17 g by mouth daily.    . valsartan (DIOVAN) 160 MG tablet Take 1 tablet (160 mg total) by mouth 2 (two) times daily. 60 tablet 0  . zolpidem (AMBIEN) 10 MG tablet Take 10 mg by mouth at bedtime as needed. For sleep     No current facility-administered medications for this visit.     Social History   Socioeconomic History  . Marital status: Married    Spouse name: Not on file  . Number of children: Not on file  . Years of education: Not on file  . Highest education level: Not on file  Social Needs  . Financial resource strain: Not on file  . Food insecurity - worry: Not on file  . Food insecurity - inability: Not on file  . Transportation needs - medical: Not on file  . Transportation needs - non-medical: Not on file  Occupational History  . Not on file  Tobacco Use  . Smoking status: Never Smoker  . Smokeless tobacco: Never Used  Substance and Sexual Activity  . Alcohol use: No    Alcohol/week: 0.0 oz  . Drug use: No  . Sexual activity: Yes    Birth control/protection: Post-menopausal, Surgical    Comment: HYST-1st intercourse 72 yo-Fewer than 5 partners,DES NEG  Other Topics Concern  . Not on file  Social History Narrative  . Not on file    Family History  Problem Relation Age of Onset  . Diabetes Father   . Hypertension Father   . Cancer Father        Prostate cancer  . Arthritis Father   .  Gout Father   . Hypertension Mother   . Stroke Mother   . Breast cancer Maternal Grandmother        Age 22's  . Ovarian cancer Paternal Grandmother   . Breast cancer Sister        Age 33  . Kidney disease Sister    Social history is notable in that she is married has 4 children 9 grandchildren. There is no tobacco or alcohol  use.  ROS General: Negative; No fevers, chills, or night sweats;  HEENT: Occasional hoarseness.  In the past she has had difficulty with vocal cord papillomas. No changes in vision or hearing, sinus congestion, difficulty swallowing Pulmonary: Negative; No cough, wheezing, shortness of breath, hemoptysis Cardiovascular: Negative; No chest pain, presyncope, syncope, palpitations GI: Negative; No nausea, vomiting, diarrhea, or abdominal pain GU: Negative; No dysuria, hematuria, or difficulty voiding Musculoskeletal: Positive for mild myalgias; no joint pain, or weakness Hematologic/Oncology: Negative; no easy bruising, bleeding Endocrine: Positive for diabetes mellitus.  No cold or heat intolerance. Neuro: Negative; no changes in balance, headaches Skin: Negative; No rashes or skin lesions Psychiatric: Negative; No behavioral problems, depression Sleep: Positive for sleep apnea, for which she uses CPAP every night.  No residual snoring, daytime sleepiness, hypersomnolence, bruxism, restless legs, hypnogognic hallucinations, no cataplexy Other comprehensive 14 point system review is negative.   PE BP (!) 142/77   Pulse 64   Ht '5\' 5"'  (1.651 m)   Wt 225 lb (102.1 kg)   BMI 37.44 kg/m    Repeat blood pressure today was 138/78.  Wt Readings from Last 3 Encounters:  08/29/17 225 lb (102.1 kg)  06/15/17 213 lb (96.6 kg)  03/18/17 210 lb (95.3 kg)   General: Alert, oriented, no distress.  Lee obese Skin: normal turgor, no rashes, warm and dry HEENT: Normocephalic, atraumatic. Pupils equal round and reactive to light; sclera anicteric; extraocular muscles intact;  Nose without nasal septal hypertrophy Mouth/Parynx benign; Mallinpatti scale 3/4 Neck: No JVD, no carotid bruits; normal carotid upstroke Lungs: clear to ausculatation and percussion; no wheezing or rales Chest wall: without tenderness to palpitation Heart: PMI not displaced, RRR, s1 s2 normal, 1/6 systolic murmur, no  diastolic murmur, no rubs, gallops, thrills, or heaves Abdomen: soft, nontender; no hepatosplenomehaly, BS+; abdominal aorta nontender and not dilated by palpation. Back: no CVA tenderness Pulses 2+ Musculoskeletal: full range of motion, normal strength, no joint deformities Extremities: no clubbing cyanosis or edema, Homan's sign negative  Neurologic: grossly nonfocal; Cranial nerves grossly wnl Psychologic: Normal mood and affect   September 2018 ECG (independently read by me): Normal sinus rhythm at 64 bpm.  First-degree AV block.  PR interval 218 ms.  LVH by voltage criteria in aVL.  January 2018 ECG (independently read by me): Sinus bradycardia with first-degree AV block.  LVH by voltage criteria.  No significant ST-T changes.  October 2017 ECG (independently read by me): Normal sinus rhythm at 61 bpm.  LVH by voltage.  Borderline first-degree AV block with PR interval 206 ms.  April 2017 ECG (independently read by me): Normal sinus rhythm at 64 bpm.  First-degree AV block with a PR interval of 218 ms.  ECG (independently read by me): Normal sinus rhythm at 68 bpm.  LVH by voltage in aVL.  July 2016 ECG (independently read by me): Normal sinus rhythm at 64 bpm..  Borderline LVH.  No cigarette ST changes.  August 2015 ECG (independently read by me): Normal sinus rhythm at 67 beats per minute.  Moderate voltage criteria for LVH.  No significant ST changes.  Borderline first degree AV block with a PR interval at 208 ms.  10/08/2013 ECG (independently read by me): Sinus rhythm at 59 beats per minute.  Borderline first-degree block with PR interval of 220 ms.  QTc interval normal.  Prior 04/17/2013 ECG: Normal sinus rhythm at 62 beats per minute. Borderline first degree AV block is no longer present with her PR interval now at 184 ms.  LABS: BMP Latest Ref Rng & Units 06/15/2016 04/05/2016 10/05/2013  Glucose 65 - 99 mg/dL 150(H) 139(H) 147(H)  BUN 8 - 27 mg/dL 29(H) 47(H) 17   Creatinine 0.57 - 1.00 mg/dL 1.33(H) 1.24(H) 0.95  BUN/Creat Ratio 12 - 28 22 - -  Sodium 134 - 144 mmol/L 137 140 141  Potassium 3.5 - 5.2 mmol/L 5.0 4.4 4.0  Chloride 96 - 106 mmol/L 103 102 102  CO2 18 - 29 mmol/L '21 28 29  ' Calcium 8.7 - 10.3 mg/dL 10.1 10.8(H) 9.6    Hepatic Function Latest Ref Rng & Units 06/15/2016 04/05/2016 10/05/2013  Total Protein 6.0 - 8.5 g/dL 7.3 7.7 6.9  Albumin 3.5 - 4.8 g/dL 4.4 4.4 4.1  AST 0 - 40 IU/L '17 15 14  ' ALT 0 - 32 IU/L '15 14 20  ' Alk Phosphatase 39 - 117 IU/L 83 87 86  Total Bilirubin 0.0 - 1.2 mg/dL 1.3(H) 0.7 0.8   CBC Latest Ref Rng & Units 06/15/2016 06/15/2016 04/05/2016  WBC 4.6 - 10.2 K/uL 20.2(A) 20.2(HH) 7.3  Hemoglobin 12.2 - 16.2 g/dL 11.4(A) 11.5 11.5(L)  Hematocrit 37.7 - 47.9 % 34.7(A) 34.1 36.1  Platelets 150 - 379 x10E3/uL 230 - 264   Lab Results  Component Value Date   MCV 72.8 (A) 06/15/2016   MCV 70 (L) 06/15/2016   MCV 72.3 (L) 04/05/2016   Lab Results  Component Value Date   TSH 1.19 04/05/2016   Lab Results  Component Value Date   HGBA1C 8.4 (H) 10/08/2013   Lipid Panel     Component Value Date/Time   CHOL 177 07/14/2016 1031   TRIG 186 (H) 07/14/2016 1031   HDL 36 (L) 07/14/2016 1031   CHOLHDL 4.9 07/14/2016 1031   VLDL 37 (H) 07/14/2016 1031   LDLCALC 104 (H) 07/14/2016 1031    RADIOLOGY: No results found.  IMPRESSION: No diagnosis found.  ASSESSMENT AND PLAN: Ms. Jupiter is a 72 year old African-American female who has a history of morbid obesity and a history of a nonischemic cardiomyopathy with an ejection fraction of 20% which subsequently has normalized.  Her last  echo Doppler study in August 2016  showed normal systolic function with an EF of 60 to 60% and there was grade 1 diastolic dysfunction.  There was mild AR and mild MR.  Her left atrium was mildly dilated. A nuclear perfusion study in September 2013 showed normal perfusion without scar or ischemia.  She has severe sleep apnea and  continues to utilize her CPAP therapy.she recently received a new CPAP machine.  Typically she admits to 100% compliance.  The 3 days that she did not use the machine she had purchased a [employer] machine and there was some confusion about set up with her having to go out of town.  He is meeting Medicare compliance standards.  However she continues to have significant daytime sleepiness which most likely is contributed by her difficulty with sleep initiation.  I have renewed a prescription for zolpidem which she has taken in the  past with benefit.  She has peripheral neuropathy and pain in both lower extremities which at times has been making it difficult to walk.  As result she also has gained an additional 15 pounds since her last evaluation.  Increased exercise and weight loss was strongly recommended.  She continues to be on valsartan 160 mg twice a day, long-acting carvedilol 40 mg daily furosemide in addition to low-dose Lanoxin at 0.125 mg for her prior cardiomyopathy.  She is on atorvastatin 20 mg for hyperlipidemia and tolerating this well.  I believe she has had recent laboratory and this may need to be done to assess optimal efficacy.  Jamie Ayers is her primary MD who typically checks her blood work.  I reviewed her download with reference to her sleep apnea.  She is requiring very high pressures and as a result I am changing her CPAP minimum pressure from 8 up to 11 cm water pressure with maximum of 20.  She has difficulty with exercise due to her peripheral neuropathy.  I have suggested water aerobics.  She will follow-up with Jamie Ayers.  I will see her in 6 months for reevaluation.   Troy Sine, MD, [facility]  08/29/2017 12:29 PM

## 2017-08-29 NOTE — Patient Instructions (Signed)

## 2017-08-30 ENCOUNTER — Encounter: Payer: Self-pay | Admitting: Cardiovascular Disease

## 2017-09-01 ENCOUNTER — Other Ambulatory Visit: Payer: Self-pay | Admitting: Gynecology

## 2017-09-01 ENCOUNTER — Telehealth: Payer: Self-pay | Admitting: *Deleted

## 2017-09-01 DIAGNOSIS — Z1231 Encounter for screening mammogram for malignant neoplasm of breast: Secondary | ICD-10-CM

## 2017-09-01 NOTE — Telephone Encounter (Signed)
Faxed 08/29/17 compliance face to face office note to [place].

## 2017-09-09 ENCOUNTER — Telehealth: Payer: Self-pay | Admitting: Cardiovascular Disease

## 2017-09-09 NOTE — Telephone Encounter (Signed)
Tried to call pt back [id number]

## 2017-09-09 NOTE — Telephone Encounter (Signed)
New Message:   Pt had a recall on valsartan (DIOVAN) 160 MG tablet and pt's pharmacy stated they would send her more but pt needs more until she can get some. Pt will be leaving home around 12 so call on sell  Phone

## 2017-09-13 ENCOUNTER — Ambulatory Visit (INDEPENDENT_AMBULATORY_CARE_PROVIDER_SITE_OTHER): Payer: Medicare Other | Admitting: Sports Medicine

## 2017-09-13 ENCOUNTER — Encounter: Payer: Self-pay | Admitting: Sports Medicine

## 2017-09-13 DIAGNOSIS — M2142 Flat foot [pes planus] (acquired), left foot: Secondary | ICD-10-CM

## 2017-09-13 DIAGNOSIS — M2141 Flat foot [pes planus] (acquired), right foot: Secondary | ICD-10-CM

## 2017-09-13 DIAGNOSIS — M2041 Other hammer toe(s) (acquired), right foot: Secondary | ICD-10-CM | POA: Diagnosis not present

## 2017-09-13 DIAGNOSIS — Z872 Personal history of diseases of the skin and subcutaneous tissue: Secondary | ICD-10-CM

## 2017-09-13 DIAGNOSIS — E119 Type 2 diabetes mellitus without complications: Secondary | ICD-10-CM

## 2017-09-13 DIAGNOSIS — M2042 Other hammer toe(s) (acquired), left foot: Secondary | ICD-10-CM | POA: Diagnosis not present

## 2017-09-13 NOTE — Progress Notes (Signed)
Subjective: Jamie Ayers is a 72 y.o. female patient with history of diabetes who presents to office today for diabetic foot exam; desires diabetic shoes; referred by PCP Dr. Marlou Sa Patient states that the glucose reading this morning was 161  mg/dl. Patient denies any new changes in medication or new problems. Patient denies any new cramping, numbness, burning or tingling in the legs on Gabapentin.  Last a1c 6 something per patient  Review of Systems  All other systems reviewed and are negative.    Patient Active Problem List   Diagnosis Date Noted  . Exertional shortness of breath 01/03/2015  . Chest pain 01/03/2015  . Morbid obesity ([place]) 10/08/2013  . Microcytic anemia 10/08/2013  . OSA on CPAP 10/08/2013  . NICM (nonischemic cardiomyopathy) ([place]) 03/11/2013  . Hyperlipemia 07/10/2012  . Allergic rhinitis 07/10/2012  . Sleep apnea 07/10/2012  . 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 Upper respiratory infection   . Asthmatic bronchitis   . CHF (congestive heart failure) ([place])   . Benign essential HTN   . GERD (gastroesophageal reflux disease)   . Intraductal papilloma of breast 08/31/2011  . 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/2011  . Fibroid   . Endocervical polyp   . CONSTIPATION, CHRONIC 08/26/2010  . INTESTINAL GAS 08/26/2010  . FULL INCONTINENCE OF FECES 08/26/2010  . COLONIC POLYPS, ADENOMATOUS, HX OF 08/24/2010   Current Outpatient Medications on File Prior to Visit  Medication Sig Dispense Refill  . Calcium Carbonate-Vitamin D (CALCIUM + D PO) Take 1 tablet by mouth daily.     . carvedilol (COREG CR) 40 MG 24 hr capsule Take 1 capsule (40 mg total) every evening by mouth. 90 capsule 3  . cycloSPORINE (RESTASIS) 0.05 % ophthalmic emulsion Place 1 drop into both eyes daily.    . digoxin (LANOXIN) 0.125 MG tablet Take 1 tablet (125 mcg total) daily by mouth. 90 tablet 3  . esomeprazole (NEXIUM) 40 MG capsule Take 1 capsule (40 mg total) by mouth 2 (two)  times daily before a meal. 180 capsule 1  . furosemide (LASIX) 40 MG tablet Take 1 tablet (40 mg total) daily by mouth. 90 tablet 3  . montelukast (SINGULAIR) 10 MG tablet Take 10 mg by mouth at bedtime.    . Multiple Vitamin (MULTIVITAMIN) tablet Take 1 tablet by mouth daily.      . nitrofurantoin, macrocrystal-monohydrate, (MACROBID) 100 MG capsule Take 1 capsule (100 mg total) by mouth 2 (two) times daily. For 7 days (Patient taking differently: Take 100 mg by mouth daily. For 7 days) 28 capsule 0  . NON FORMULARY CPAP THERAPY    . NONFORMULARY OR COMPOUNDED ITEM Boric acid vaginal suppositories 600 mg one per vagina as needed for yeast 30 each 0  . omega-3 acid ethyl esters (LOVAZA) 1 g capsule Take 2 capsules (2 g total) 2 (two) times daily by mouth. 360 capsule 3  . Oxycodone HCl 10 MG TABS Take 1 tablet by mouth as needed.  0  . polyethylene glycol (MIRALAX / GLYCOLAX) packet Take 17 g by mouth daily.    . valsartan (DIOVAN) 160 MG tablet Take 1 tablet (160 mg total) by mouth 2 (two) times daily. 60 tablet 0  . zolpidem (AMBIEN) 10 MG tablet Take 1 tablet (10 mg total) by mouth at bedtime as needed. For sleep 30 tablet 3  . albuterol (PROVENTIL HFA;VENTOLIN HFA) 108 (90 BASE) MCG/ACT inhaler Inhale 2 puffs into the lungs every 4 (four) hours as needed for wheezing. 3.7  g 0  . atorvastatin (LIPITOR) 20 MG tablet Take 1 tablet (20 mg total) daily by mouth. 90 tablet 3   No current facility-administered medications on file prior to visit.    Allergies  Allergen Reactions  . Midazolam Other (See Comments)    REACTION: cardiac arrest  . Midazolam Hcl     Cardiac arrest    VERSED  . Codeine Hives and Itching  . Morphine And Related Hives  . Paba Derivatives Nausea Only    PT REPORTS SEVERE NAUSEA AFTER SURGERY, DENIES VOMITING  . Sulfonamide Derivatives Hives and Itching  . Sulfamethoxazole Hives    No results found for this or any previous visit (from the past 2160  hour(s)).  Objective: General: Patient is awake, alert, and oriented x 3 and in no acute distress.  Integument: Skin is warm, dry and supple bilateral. Nails are short and thick, 1-5 bilateral. No signs of infection. Minimal corn to 5th toes bilateral. Remaining integument unremarkable.  Vasculature:  Dorsalis Pedis pulse 2/4 bilateral. Posterior Tibial pulse  1/4 bilateral.  Capillary fill time <3 sec 1-5 bilateral. Positive hair growth to the level of the digits. Temperature gradient within normal limits. No varicosities present bilateral. No edema present bilateral.   Neurology: The patient has intact sensation measured with a 5.07/10g Semmes Weinstein Monofilament at all pedal sites bilateral. Vibratory sensation diminished bilateral with tuning fork. No Babinski sign present bilateral.   Musculoskeletal: Hammertoe and pes planus asymptomatic pedal deformities noted bilateral. Muscular strength 5/5 in all lower extremity muscular groups bilateral without pain on range of motion . No tenderness with calf compression bilateral.  Assessment and Plan: Problem List Items Addressed This Visit    None    Visit Diagnoses    Comprehensive diabetic foot examination, type 2 DM, encounter for [facility])    -  Primary   Diabetes mellitus without complication ([place])       Hammer toes of both feet       H/O corn of toe       Pes planus of both feet         -Examined patient. -Discussed and educated patient on diabetic foot care, especially with  regards to the vascular, neurological and musculoskeletal systems.  -Stressed the importance of good glycemic control and the detriment of not  controlling glucose levels in relation to the foot. -[employer] diabetic shoe order form was completed; office to contact primary care for approval / certification;  Office to arrange shoe fitting and dispensing. -Answered all patient questions -Patient to return for diabetic shoe measurements  -Patient advised to  call the office if any problems or questions arise in the meantime.  Landis Martins, DPM

## 2017-09-19 ENCOUNTER — Ambulatory Visit
Admission: RE | Admit: 2017-09-19 | Discharge: 2017-09-19 | Disposition: A | Discharge status: Home / Self Care | Payer: Medicare Other | Source: Ambulatory Visit | Attending: Gynecology | Admitting: Gynecology

## 2017-09-19 DIAGNOSIS — Z1231 Encounter for screening mammogram for malignant neoplasm of breast: Secondary | ICD-10-CM

## 2017-09-21 ENCOUNTER — Encounter: Payer: Medicare Other | Admitting: Genetic Counselor

## 2017-09-21 ENCOUNTER — Other Ambulatory Visit: Payer: Medicare Other

## 2017-09-27 ENCOUNTER — Other Ambulatory Visit: Payer: Self-pay | Admitting: Internal Medicine

## 2017-09-27 ENCOUNTER — Ambulatory Visit
Admission: RE | Admit: 2017-09-27 | Discharge: 2017-09-27 | Disposition: A | Discharge status: Home / Self Care | Payer: Medicare Other | Source: Ambulatory Visit | Attending: Internal Medicine | Admitting: Internal Medicine

## 2017-09-27 DIAGNOSIS — R109 Unspecified abdominal pain: Secondary | ICD-10-CM

## 2017-10-03 ENCOUNTER — Encounter: Payer: Self-pay | Admitting: Gynecology

## 2017-10-03 ENCOUNTER — Ambulatory Visit (INDEPENDENT_AMBULATORY_CARE_PROVIDER_SITE_OTHER): Payer: Medicare Other | Admitting: Gynecology

## 2017-10-03 VITALS — BP 124/74

## 2017-10-03 DIAGNOSIS — N952 Postmenopausal atrophic vaginitis: Secondary | ICD-10-CM | POA: Diagnosis not present

## 2017-10-03 DIAGNOSIS — N3000 Acute cystitis without hematuria: Secondary | ICD-10-CM | POA: Diagnosis not present

## 2017-10-03 LAB — WET PREP FOR TRICH, YEAST, CLUE

## 2017-10-03 MED ORDER — NITROFURANTOIN MONOHYD MACRO 100 MG PO CAPS: 100.0000 mg | ORAL_CAPSULE | Freq: Two times a day (BID) | ORAL | 0 refills | Status: DC

## 2017-10-03 NOTE — Progress Notes (Signed)
    Jamie Ayers 04/24/1946 [id number]        71 y.o.  [name] presents complaining of pelvic pressure and urinary frequency.  Also notes some vaginal burning with intercourse.  Had taken a Diflucan last week for vaginal irritation.  No significant discharge or odor.  No urinary urgency, dysuria, low back pain, fever or chills.  Saw her primary physician who ordered nominal films which showed a lot of stool in the colon but otherwise unremarkable.  Patient is status post TAH/BSO in the past.  Past medical history,surgical history, problem list, medications, allergies, family history and social history were all reviewed and documented in the EPIC chart.  Directed ROS with pertinent positives and negatives documented in the history of present illness/assessment and plan.  Exam: Jamie Ayers assistant Vitals:   10/03/17 1017  BP: 124/74   General appearance:  Normal Abdomen soft nontender without masses guarding rebound Pelvic external BUS vagina with atrophic changes.  Bimanual without masses or tenderness.  Assessment/Plan:  72 y.o. [id number] with history as above.  Urine analysis does show some bacteriuria and 6-10 WBC.  Will cover as a low-grade UTI with Macrobid 100 mg twice daily times 7 days.  Follow-up on urine culture.  Wet prep was negative.  We discussed her vaginal burning symptoms.  I suspect that these are due to atrophy.  I recommended she try an over-the-counter vaginal moisturizers such as Replens twice weekly and see if this does not help with those symptoms.  Will follow up with her primary physician if her lower abdominal pain continues as I do not think it is of GYN etiology given her TAH/BSO history in the past.    Jamie Auerbach MD, 10:30 AM 10/03/2017

## 2017-10-03 NOTE — Patient Instructions (Signed)
Take the antibiotic twice daily as prescribed for 7 days.    Follow-up with your primary physician if your symptoms persist.

## 2017-10-03 NOTE — Addendum Note (Signed)
Addended by: Nelva Nay on: 10/03/2017 10:57 AM   Modules accepted: Orders

## 2017-10-05 LAB — URINALYSIS, COMPLETE W/RFL CULTURE
Bilirubin Urine: NEGATIVE
Glucose, UA: NEGATIVE
HGB URINE DIPSTICK: NEGATIVE
Hyaline Cast: NONE SEEN /LPF
Ketones, ur: NEGATIVE
NITRITES URINE, INITIAL: NEGATIVE
RBC / HPF: NONE SEEN /HPF (ref 0–2)
Specific Gravity, Urine: 1.01 (ref 1.001–1.03)
pH: 5.5 (ref 5.0–8.0)

## 2017-10-05 LAB — URINE CULTURE
MICRO NUMBER:: 90466711
SPECIMEN QUALITY:: ADEQUATE

## 2017-10-05 LAB — CULTURE INDICATED

## 2017-10-19 ENCOUNTER — Telehealth: Payer: Self-pay | Admitting: *Deleted

## 2017-10-19 NOTE — Telephone Encounter (Signed)
Faxed order for CPAP mask ordered on 10/06/17 to [facility].

## 2017-11-04 ENCOUNTER — Other Ambulatory Visit: Payer: Self-pay | Admitting: Cardiovascular Disease

## 2017-11-04 MED ORDER — ESOMEPRAZOLE MAGNESIUM 40 MG PO CPDR: 40.0000 mg | DELAYED_RELEASE_CAPSULE | Freq: Two times a day (BID) | ORAL | 1 refills | Status: DC

## 2017-11-04 NOTE — Telephone Encounter (Signed)
New message     *STAT* If patient is at the pharmacy, call can be transferred to refill team.   1. Which medications need to be refilled? (please list name of each medication and dose if known) esomeprazole (NEXIUM) 40 MG capsule  2. Which pharmacy/location (including street and city if local pharmacy) is medication to be sent to?[facility] - [place], [address]  3. Do they need a 30 day or 90 day supply? [place]

## 2017-11-04 NOTE — Telephone Encounter (Signed)
Rx request sent to pharmacy.  

## 2018-01-02 ENCOUNTER — Other Ambulatory Visit: Payer: Medicare Other | Admitting: Orthotics

## 2018-01-11 ENCOUNTER — Telehealth: Payer: Self-pay | Admitting: Cardiovascular Disease

## 2018-01-11 MED ORDER — FUROSEMIDE 40 MG PO TABS: 40.0000 mg | ORAL_TABLET | Freq: Every day | ORAL | 1 refills | Status: DC

## 2018-01-11 MED ORDER — OMEGA-3-ACID ETHYL ESTERS 1 G PO CAPS: 2.0000 g | ORAL_CAPSULE | Freq: Two times a day (BID) | ORAL | 1 refills | Status: DC

## 2018-01-11 MED ORDER — ATORVASTATIN CALCIUM 20 MG PO TABS: 20.0000 mg | ORAL_TABLET | Freq: Every day | ORAL | 1 refills | Status: DC

## 2018-01-11 MED ORDER — VALSARTAN 160 MG PO TABS: 160.0000 mg | ORAL_TABLET | Freq: Two times a day (BID) | ORAL | 1 refills | Status: DC

## 2018-01-11 NOTE — Telephone Encounter (Signed)
New Message        *STAT* If patient is at the pharmacy, call can be transferred to refill team.   1. Which medications need to be refilled? (please list name of each medication and dose if known) Valsartan 160/Atorvastain 20 mg Omega-3 1000 mg/furosemide 40   2. Which pharmacy/location (including street and city if local pharmacy) is medication to be sent to?[facility]  3. Do they need a 30 day or 90 day supply? 90 with refill

## 2018-01-16 ENCOUNTER — Other Ambulatory Visit: Payer: Self-pay | Admitting: Cardiovascular Disease

## 2018-01-16 ENCOUNTER — Telehealth: Payer: Self-pay | Admitting: Cardiovascular Disease

## 2018-01-16 MED ORDER — VALSARTAN 160 MG PO TABS: 160.0000 mg | ORAL_TABLET | Freq: Two times a day (BID) | ORAL | 1 refills | Status: DC

## 2018-01-16 NOTE — Telephone Encounter (Signed)
Refill sent to the pharmacy electronically.  

## 2018-01-16 NOTE — Telephone Encounter (Signed)
New Messagse        *STAT* If patient is at the pharmacy, call can be transferred to refill team.   1. Which medications need to be refilled? (please list name of each medication and dose if known) Valsartan  2. Which pharmacy/location (including street and city if local pharmacy) is medication to be sent to?[facility] on [address]   3. Do they need a 30 day or 90 day supply? 30   Patient pharmacy is (mail order) out of the current medication and she needs a Rx called in to the local pharmacy today.

## 2018-02-02 ENCOUNTER — Telehealth: Payer: Self-pay | Admitting: Sports Medicine

## 2018-02-02 NOTE — Telephone Encounter (Signed)
Called pt and she was asking if she could get an appt while she was in town. I have changed her appt on 8.19.19 and she said thank you. Her auth for the shoes expires 8.24.19

## 2018-02-06 ENCOUNTER — Ambulatory Visit (INDEPENDENT_AMBULATORY_CARE_PROVIDER_SITE_OTHER): Payer: Medicare Other | Admitting: Orthotics

## 2018-02-06 DIAGNOSIS — E119 Type 2 diabetes mellitus without complications: Secondary | ICD-10-CM | POA: Diagnosis not present

## 2018-02-06 DIAGNOSIS — M2041 Other hammer toe(s) (acquired), right foot: Secondary | ICD-10-CM | POA: Diagnosis not present

## 2018-02-06 DIAGNOSIS — Z872 Personal history of diseases of the skin and subcutaneous tissue: Secondary | ICD-10-CM | POA: Diagnosis not present

## 2018-02-06 DIAGNOSIS — M2142 Flat foot [pes planus] (acquired), left foot: Secondary | ICD-10-CM

## 2018-02-06 DIAGNOSIS — M2141 Flat foot [pes planus] (acquired), right foot: Secondary | ICD-10-CM

## 2018-02-06 DIAGNOSIS — M2042 Other hammer toe(s) (acquired), left foot: Secondary | ICD-10-CM | POA: Diagnosis not present

## 2018-02-06 NOTE — Progress Notes (Signed)

## 2018-02-13 ENCOUNTER — Other Ambulatory Visit: Payer: Medicare Other | Admitting: Orthotics

## 2018-03-17 ENCOUNTER — Ambulatory Visit: Payer: Medicare Other | Admitting: Registered"

## 2018-03-18 ENCOUNTER — Telehealth: Payer: Self-pay | Admitting: Obstetrics and Gynecology

## 2018-03-18 NOTE — Telephone Encounter (Signed)
Phone call - Saturday am  Patient reporting pain with urination, back pain, and lower leg pain.   I recommend evaluation and treatment through urgent care.   Her husband can bring her to [place] which is close to her home.

## 2018-03-21 ENCOUNTER — Other Ambulatory Visit: Payer: Self-pay | Admitting: Internal Medicine

## 2018-03-21 DIAGNOSIS — IMO0002 Reserved for concepts with insufficient information to code with codable children: Secondary | ICD-10-CM

## 2018-03-21 DIAGNOSIS — R229 Localized swelling, mass and lump, unspecified: Principal | ICD-10-CM

## 2018-03-28 ENCOUNTER — Other Ambulatory Visit: Payer: Medicare Other | Admitting: Orthotics

## 2018-04-04 ENCOUNTER — Ambulatory Visit: Payer: Medicare Other | Admitting: *Deleted

## 2018-04-10 ENCOUNTER — Telehealth: Payer: Self-pay | Admitting: Cardiovascular Disease

## 2018-04-10 NOTE — Telephone Encounter (Signed)
Patient states that she has noticed increased fatigue and SOB. SOB is with exertion only. Patient denies fever, chills, weight gain, edema etc. Please discuss with Dr. Claiborne Billings.

## 2018-04-10 NOTE — Telephone Encounter (Signed)
New Message   Pt c/o Shortness Of Breath: STAT if SOB developed within the last 24 hours or pt is noticeably SOB on the phone  1. Are you currently SOB (can you hear that pt is SOB on the phone)? No, did not hear any on the phone   2. How long have you been experiencing SOB? Since the weekend   3. Are you SOB when sitting or when up moving around? Moving around   4. Are you currently experiencing any other symptoms? Very fatigued

## 2018-04-10 NOTE — Telephone Encounter (Signed)
Arrange for follow-up office visit with either APP or myself as schedule allows

## 2018-04-11 NOTE — Telephone Encounter (Signed)
Left detailed message (ok per DPR) with recommendations.  Advised to call back to schedule appt with Dr. Claiborne Billings or APP.

## 2018-04-13 ENCOUNTER — Ambulatory Visit (INDEPENDENT_AMBULATORY_CARE_PROVIDER_SITE_OTHER): Payer: Medicare Other | Admitting: Cardiovascular Disease

## 2018-04-13 ENCOUNTER — Encounter: Payer: Self-pay | Admitting: Cardiovascular Disease

## 2018-04-13 VITALS — BP 142/82 | HR 70 | Ht 65.0 in | Wt 230.4 lb

## 2018-04-13 DIAGNOSIS — I739 Peripheral vascular disease, unspecified: Secondary | ICD-10-CM

## 2018-04-13 DIAGNOSIS — M79604 Pain in right leg: Secondary | ICD-10-CM

## 2018-04-13 DIAGNOSIS — E669 Obesity, unspecified: Secondary | ICD-10-CM

## 2018-04-13 DIAGNOSIS — G4733 Obstructive sleep apnea (adult) (pediatric): Secondary | ICD-10-CM

## 2018-04-13 DIAGNOSIS — I10 Essential (primary) hypertension: Secondary | ICD-10-CM | POA: Diagnosis not present

## 2018-04-13 DIAGNOSIS — M79605 Pain in left leg: Secondary | ICD-10-CM

## 2018-04-13 DIAGNOSIS — R0602 Shortness of breath: Secondary | ICD-10-CM

## 2018-04-13 DIAGNOSIS — I428 Other cardiomyopathies: Secondary | ICD-10-CM | POA: Diagnosis not present

## 2018-04-13 DIAGNOSIS — Z9989 Dependence on other enabling machines and devices: Secondary | ICD-10-CM

## 2018-04-13 DIAGNOSIS — E668 Other obesity: Secondary | ICD-10-CM

## 2018-04-13 NOTE — Progress Notes (Signed)
Patient ID: Jamie Ayers, female   DOB: [birth year], 72 y.o.   MRN: [id number]     Primary  M.D.: Dr. Kevan Ny  HPI: Jamie Ayers is a 72 y.o. female who presents to the office for a 7 month cardiology evaluation.  .  Ms. Markiewicz has a remote history of a nonischemic cardiomyopathy.  In December 2005 her ejection fraction was 20%. With aggressive medical therapy LV function normalized to approximately 55%. She has a history of obstructive sleep apnea and has been utilizing CPAP therapy since 2008. 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 She also has undergone papilloma from a breast duct removal at the time of the hysterectomy.  An echo Doppler study on 03/20/2013 showed an ejection fraction at 50-55% with mild concentric left ventricular hypertrophy. She had normal diastolic function. There was evidence for mild aortic insufficiency and mild mitral regurg patient with a centrally directed jet. Her left atrium was mildly dilated. She recently had repeat laboratory done by Dr. Kevan Ny.  Laboratory on 10/05/2013 revealed hemoglobin11.6/ hematocrit 36.0.  She did have microcytic indices at 73.5.  She denies any history of sickle cell trait or thalassemia.  She is unaware of blood loss.  Glucose was elevated at 147.  She tells me in the past Dr. Marlou Sa had tried metformin but she did not tolerate this.  TSH was 1.054.  She is continuing to use CPAP with 100% compliance.  She denies daytime fatigue or awareness of breakthrough snoring.   On 01/28/2015 a follow-up echo Doppler study revealed an ejection fraction at 60-65% without wall motion abnormalities.  There was grade 1 diastolic dysfunction, mild aortic and mild mitral regurgitation.  The left atrium was mildly dilated.  Recently, she has noticed some mild myalgias in her legs.    She was seen by Dr. Percival Spanish in 2017 with complaints of fatigue and  some shortness of breath.  BNP was normal at 12.7.  Blood work by Dr. Marlou Sa from 03/02/2017revealed  a TSH of 0.65.  She had microcytic indices with an MCV of 73, hemoglobin 12.4.  Her creatinine had risen to 1.58 with a BUN of 44.  Calcium was mildly elevated at 10.9.  Saw her in follow-up one month later.  She continues to use CPAP therapy.  I was able to review a download from 08/13/2015 through 09/11/2015.  She has S9.  AutoSet CPAP ResMed unit, 95th percentile pressure is 15.5.  AHI is excellent at 0.5.  She is compliant with 97% of usage stays averaging approximately 8 hours sleep per night.    Repeat blood work revealed a BUN of 47 and creatinine of 1.24.  Her calcium was mildly increased at 10.8.  Lipid studies were markedly abnormal.  Apparently, she had stopped taking atorvastatin the past 2 months.  Her total cholesterol had risen to 297, triglycerides 303, HDL 39, VLDL 61, and LDL 197.  Her hemoglobin is 11.5 with hematocrit 36.1.  She has had consistent microcytosis.  TSH remained normal at 1.19.    She was hospitalized at [facility] with pancreatitis.  She had developed significant vomiting and nausea, and on 06/17/2016 she was seen at urgent care and was found to have an amylase of 1120 and a lipase of 624.  Her Januvia was stopped as well, as Zetia.   I last saw her she denied any chest pain or  shortness of breath.  She has noticed some difficulty with her legs and feet.  She notes cramping when she walks but at times note some muscle aches, not exertionally.  She also has noticed a vague nonexertional short-lived chest sensation which resolved spontaneously.  She does note some occasional ankle swelling.  She has been using CPAP with 100% compliance and can't sleep without it.    She received a new CPAP machine on June 13, 2017.  A download was obtained in the office today from February 10 - August 29, 2017.  She is meeting Medicare compliance standards with 90% of usage days.  Usage greater  than 4 hours was 26 days or 87%.  Days that she did not use her machine was when she had received a soap clean machine and not realize when she was out of town the reservoir requirement.  She is averaging 8 hours and 30 minutes of CPAP use per night.  She has been set on a auto CPAP minimum pressure of 8 and a maximum of 20.  AHI is 0.6.  There is no significant leak.  She is requiring high pressures with 95th percentile pressure at 18.2 with maximum average pressure at 19.2.  She is unaware of breakthrough snoring.  Her sleep is restorative.  She continues to experience times sleepiness and Epworth sleepiness scale score was endorsed  and calculated at 18.  She has difficulty with sleep initiation.  She often goes to bed at 11 PM but cannot fall asleep for several hours and at times is still awake at 2 AM.  She has a peripheral neuropathy.    When I last saw her in March 2019 she was having more fatigue and a download of her CPAP unit indicated that she was requiring very high pressures.  As result I changed her CPAP minimum pressure from 8 up to 11 cm of water pressure with a maximum of 20.  She had recently called our office with complaints of increasing fatigue and shortness of breath with exertion.  She denies chest tightness fever chills or night sweats.  She was worked into my office today.  She admits to feeling coldness of her feet.  She has paresthesias from her neuropathy.  She has issues with lumbar disc disease.  She had blood work by her primary physician, Dr. Marlou Sa last week.  She was having issues with spinal stenosis and she received a short course of prednisone therapy.  She admits to exertional dyspnea and palpitations.  She admits to 100% CPAP use.  She presents for evaluation   Past Medical History:  Diagnosis Date  . Anemia   . Anxiety disorder   . Asthmatic bronchitis   . Benign essential HTN 08/06/11   ECHO-EF>55%  . CHF (congestive heart failure) (HCC)    HeFPEF  . Diabetes  mellitus without complication ([place])   . Disorder of vocal cord   . GERD (gastroesophageal reflux disease)   . Hernia   . Hyperlipidemia 03/14/12   Lexiscan myoview-WNL; unchanged from previous study.  . OSA on CPAP 03/28/07   sleep study-[facility]-AHI 9.42/HR and during REM sleep _0 .50/hr. The average 02 sat duringREM AND NREM was 97.0%  . Thyroid disease   . Upper respiratory infection     Past Surgical History:  Procedure Laterality Date  . ABDOMINAL HYSTERECTOMY  2013   TAH BSO  . BREAST EXCISIONAL BIOPSY Left 2017   papilloma  . BREAST RECONSTRUCTION Bilateral    Patient had fat  pads removed from both sides   . BREAST SURGERY     Papilloma  . DILATION AND CURETTAGE OF UTERUS    . ENDOMETRIAL BIOPSY    . HERNIA REPAIR    . HYSTEROSCOPY    . KNEE SURGERY     arthroscopic  . NISSEN FUNDOPLICATION    . OOPHORECTOMY     BSO  . Parathyroid surg    . Vocal Cord Surg      Allergies  Allergen Reactions  . Midazolam Other (See Comments)    REACTION: cardiac arrest  . Midazolam Hcl     Cardiac arrest    VERSED  . Codeine Hives and Itching  . Morphine And Related Hives  . Paba Derivatives Nausea Only    PT REPORTS SEVERE NAUSEA AFTER SURGERY, DENIES VOMITING  . Sulfonamide Derivatives Hives and Itching  . Sulfamethoxazole Hives    Current Outpatient Medications  Medication Sig Dispense Refill  . Calcium Carbonate-Vitamin D (CALCIUM + D PO) Take 1 tablet by mouth daily.     . carvedilol (COREG CR) 40 MG 24 hr capsule Take 1 capsule (40 mg total) every evening by mouth. 90 capsule 3  . cycloSPORINE (RESTASIS) 0.05 % ophthalmic emulsion Place 1 drop into both eyes daily.    . digoxin (LANOXIN) 0.125 MG tablet Take 1 tablet (125 mcg total) daily by mouth. 90 tablet 3  . esomeprazole (NEXIUM) 40 MG capsule Take 1 capsule (40 mg total) by mouth 2 (two) times daily before a meal. 180 capsule 1  . furosemide (LASIX) 40 MG tablet Take 1 tablet (40 mg  total) by mouth daily. 90 tablet 1  . montelukast (SINGULAIR) 10 MG tablet Take 10 mg by mouth at bedtime.    . Multiple Vitamin (MULTIVITAMIN) tablet Take 1 tablet by mouth daily.      . nitrofurantoin, macrocrystal-monohydrate, (MACROBID) 100 MG capsule Take 1 capsule (100 mg total) by mouth 2 (two) times daily. For 7 days 14 capsule 0  . NON FORMULARY CPAP THERAPY    . NONFORMULARY OR COMPOUNDED ITEM Boric acid vaginal suppositories 600 mg one per vagina as needed for yeast 30 each 0  . omega-3 acid ethyl esters (LOVAZA) 1 g capsule Take 2 capsules (2 g total) by mouth 2 (two) times daily. 360 capsule 1  . Oxycodone HCl 10 MG TABS Take 1 tablet by mouth as needed.  0  . polyethylene glycol (MIRALAX / GLYCOLAX) packet Take 17 g by mouth daily.    . valsartan (DIOVAN) 160 MG tablet TAKE 1 TABLET BY MOUTH TWICE DAILY 180 tablet 1  . zolpidem (AMBIEN) 10 MG tablet Take 1 tablet (10 mg total) by mouth at bedtime as needed. For sleep 30 tablet 3  . albuterol (PROVENTIL HFA;VENTOLIN HFA) 108 (90 BASE) MCG/ACT inhaler Inhale 2 puffs into the lungs every 4 (four) hours as needed for wheezing. 3.7 g 0  . atorvastatin (LIPITOR) 20 MG tablet Take 1 tablet (20 mg total) by mouth daily. 90 tablet 1   No current facility-administered medications for this visit.     Social History   Socioeconomic History  . Marital status: Married    Spouse name: Not on file  . Number of children: Not on file  . Years of education: Not on file  . Highest education level: Not on file  Occupational History  . Not on file  Social Needs  . Financial resource strain: Not on file  . Food insecurity:    Worry: Not  on file    Inability: Not on file  . Transportation needs:    Medical: Not on file    Non-medical: Not on file  Tobacco Use  . Smoking status: Never Smoker  . Smokeless tobacco: Never Used  Substance and Sexual Activity  . Alcohol use: No    Alcohol/week: 0.0 standard drinks  . Drug use: No  . Sexual  activity: Yes    Birth control/protection: Post-menopausal, Surgical    Comment: HYST-1st intercourse 72 yo-Fewer than 5 partners,DES NEG  Lifestyle  . Physical activity:    Days per week: Not on file    Minutes per session: Not on file  . Stress: Not on file  Relationships  . Social connections:    Talks on phone: Not on file    Gets together: Not on file    Attends religious service: Not on file    Active member of club or organization: Not on file    Attends meetings of clubs or organizations: Not on file    Relationship status: Not on file  . Intimate partner violence:    Fear of current or ex partner: Not on file    Emotionally abused: Not on file    Physically abused: Not on file    Forced sexual activity: Not on file  Other Topics Concern  . Not on file  Social History Narrative  . Not on file    Family History  Problem Relation Age of Onset  . Diabetes Father   . Hypertension Father   . Cancer Father        Prostate cancer  . Arthritis Father   . Gout Father   . Hypertension Mother   . Stroke Mother   . Breast cancer Maternal Grandmother        Age 67's  . Ovarian cancer Paternal Grandmother   . Breast cancer Sister        Age 108  . Kidney disease Sister    Social history is notable in that she is married has 4 children 9 grandchildren. There is no tobacco or alcohol use.  ROS General: Negative; No fevers, chills, or night sweats; positive for fatigue HEENT: Occasional hoarseness.  In the past she has had difficulty with vocal cord papillomas. No changes in vision or hearing, sinus congestion, difficulty swallowing Pulmonary: Negative; No cough, wheezing, shortness of breath, hemoptysis Cardiovascular: Negative; No chest pain, presyncope, syncope, palpitations GI: Negative; No nausea, vomiting, diarrhea, or abdominal pain GU: Negative; No dysuria, hematuria, or difficulty voiding Musculoskeletal: Positive for mild myalgias; spinal  stenosis Hematologic/Oncology: Negative; no easy bruising, bleeding Endocrine: Positive for diabetes mellitus.  No cold or heat intolerance. Neuro: Negative; no changes in balance, headaches Skin: Negative; No rashes or skin lesions Psychiatric: Negative; No behavioral problems, depression Sleep: Positive for sleep apnea, for which she uses CPAP every night.  No residual snoring, daytime sleepiness, hypersomnolence, bruxism, restless legs, hypnogognic hallucinations, no cataplexy Other comprehensive 14 point system review is negative.   PE BP (!) 142/82   Pulse 70   Ht _0  (1.651 m)   Wt 230 lb 6.4 oz (104.5 kg)   BMI 38.34 kg/m    Repeat blood pressure by me 114/78.  Wt Readings from Last 3 Encounters:  04/13/18 230 lb 6.4 oz (104.5 kg)  08/29/17 225 lb (102.1 kg)  06/15/17 213 lb (96.6 kg)   General: Alert, oriented, no distress.  Skin: normal turgor, no rashes, warm and dry HEENT: Normocephalic, atraumatic. Pupils  equal round and reactive to light; sclera anicteric; extraocular muscles intact;  Nose without nasal septal hypertrophy Mouth/Parynx benign; Mallinpatti scale 3/4 Neck: No JVD, no carotid bruits; normal carotid upstroke Lungs: clear to ausculatation and percussion; no wheezing or rales Chest wall: without tenderness to palpitation Heart: PMI not displaced, RRR, s1 s2 normal, 1/6 systolic murmur, no diastolic murmur, no rubs, gallops, thrills, or heaves Abdomen: soft, nontender; no hepatosplenomehaly, BS+; abdominal aorta nontender and not dilated by palpation. Back: no CVA tenderness Pulses slightly decreased pulses in her feet. Musculoskeletal: full range of motion, normal strength, no joint deformities Extremities: no clubbing cyanosis or edema, Homan's sign negative  Neurologic: grossly nonfocal; Cranial nerves grossly wnl Psychologic: Normal mood and affect   ECG (independently read by me): Normal sinus rhythm at 70 bpm.  LVH by voltage criteria probable  left atrial enlargement.  Non-specific ST changes.  September 2018 ECG (independently read by me): Normal sinus rhythm at 64 bpm.  First-degree AV block.  PR interval 218 ms.  LVH by voltage criteria in aVL.  January 2018 ECG (independently read by me): Sinus bradycardia with first-degree AV block.  LVH by voltage criteria.  No significant ST-T changes.  October 2017 ECG (independently read by me): Normal sinus rhythm at 61 bpm.  LVH by voltage.  Borderline first-degree AV block with PR interval 206 ms.  April 2017 ECG (independently read by me): Normal sinus rhythm at 64 bpm.  First-degree AV block with a PR interval of 218 ms.  ECG (independently read by me): Normal sinus rhythm at 68 bpm.  LVH by voltage in aVL.  July 2016 ECG (independently read by me): Normal sinus rhythm at 64 bpm..  Borderline LVH.  No cigarette ST changes.  August 2015 ECG (independently read by me): Normal sinus rhythm at 67 beats per minute.  Moderate voltage criteria for LVH.  No significant ST changes.  Borderline first degree AV block with a PR interval at 208 ms.  10/08/2013 ECG (independently read by me): Sinus rhythm at 59 beats per minute.  Borderline first-degree block with PR interval of 220 ms.  QTc interval normal.  Prior 04/17/2013 ECG: Normal sinus rhythm at 62 beats per minute. Borderline first degree AV block is no longer present with her PR interval now at 184 ms.  LABS: BMP Latest Ref Rng & Units 06/15/2016 04/05/2016 10/05/2013  Glucose 65 - 99 mg/dL 150(H) 139(H) 147(H)  BUN 8 - 27 mg/dL 29(H) 47(H) 17  Creatinine 0.57 - 1.00 mg/dL 1.33(H) 1.24(H) 0.95  BUN/Creat Ratio 12 - 28 22 - -  Sodium 134 - 144 mmol/L 137 140 141  Potassium 3.5 - 5.2 mmol/L 5.0 4.4 4.0  Chloride 96 - 106 mmol/L 103 102 102  CO2 18 - 29 mmol/L _0 Calcium 8.7 - 10.3 mg/dL 10.1 10.8(H) 9.6    Hepatic Function Latest Ref Rng & Units 06/15/2016 04/05/2016 10/05/2013  Total Protein 6.0 - 8.5 g/dL 7.3 7.7 6.9   Albumin 3.5 - 4.8 g/dL 4.4 4.4 4.1  AST 0 - 40 IU/L _1 ALT 0 - 32 IU/L _2 Alk Phosphatase 39 - 117 IU/L 83 87 86  Total Bilirubin 0.0 - 1.2 mg/dL 1.3(H) 0.7 0.8   CBC Latest Ref Rng & Units 06/15/2016 06/15/2016 04/05/2016  WBC 4.6 - 10.2 K/uL 20.2(A) 20.2(HH) 7.3  Hemoglobin 12.2 - 16.2 g/dL 11.4(A) 11.5 11.5(L)  Hematocrit 37.7 - 47.9 % 34.7(A) 34.1 36.1  Platelets 150 -  379 x10E3/uL 230 - 264   Lab Results  Component Value Date   MCV 72.8 (A) 06/15/2016   MCV 70 (L) 06/15/2016   MCV 72.3 (L) 04/05/2016   Lab Results  Component Value Date   TSH 1.19 04/05/2016   Lab Results  Component Value Date   HGBA1C 8.4 (H) 10/08/2013   Lipid Panel     Component Value Date/Time   CHOL 177 07/14/2016 1031   TRIG 186 (H) 07/14/2016 1031   HDL 36 (L) 07/14/2016 1031   CHOLHDL 4.9 07/14/2016 1031   VLDL 37 (H) 07/14/2016 1031   LDLCALC 104 (H) 07/14/2016 1031    RADIOLOGY: No results found.  IMPRESSION: 1. NICM (nonischemic cardiomyopathy) ([place])   2. SOB (shortness of breath)   3. Claudication ([place])   4. Benign essential HTN   5. Pain in both lower extremities   6. OSA on CPAP   7. Moderate obesity     ASSESSMENT AND PLAN: Ms. Delao is a 72 year old African-American female who has a history of morbid obesity and a history of a nonischemic cardiomyopathy with an ejection fraction of 20% initially documented in 2005 which subsequently has normalized.  Her last  echo Doppler study in August 2016  showed normal systolic function with an EF of 60 to 60% and there was grade 1 diastolic dysfunction.  There was mild AR and mild MR.  Her left atrium was mildly dilated. A nuclear perfusion study in September 2013 showed normal perfusion without scar or ischemia.  She has severe sleep apnea and continues to utilize her CPAP therapy.she admits to 100% compliance.  When I last saw her due to her high pressure requirement I increased her minimum pressure.  She is sleeping  better.  Her major complaint today is that of coldness of her feet and she was wondering about obtaining Doppler evaluations.  She recently had issues with spinal stenosis and myelopathy for which she received steroids.  She admits to exertional shortness of breath.  She has not had a recent echo since 2016.  I am recommending she undergo a 2D echo Doppler study to reassess systolic and diastolic function as well as valvular architecture.  I will also obtain lower extremity arterial Doppler studies in light of her sensation of cold lower extremities with questionable claudication.  Her blood pressure today is controlled on current therapy consisting of valsartan 160 mg, furosemide 40 mg and carvedilol CR 40 mg.  She continues to be obese with a BMI of 38.34.  She is on atorvastatin 20 mg for hyperlipidemia.  I will try to obtain the results of the blood work which she had done from Dr. Marlou Sa last week.  I will contact her regarding her noninvasive studies and see her back in the office in 6 weeks.  Time spent: 25 minutes Troy Sine, MD, [facility]  04/14/2018 5:14 PM

## 2018-04-13 NOTE — Patient Instructions (Addendum)
Medication Instructions:  Your physician recommends that you continue on your current medications as directed. Please refer to the Current Medication list given to you today.  If you need a refill on your cardiac medications before your next appointment, please call your pharmacy.   Testing/Procedures: Your physician has requested that you have an echocardiogram. Echocardiography is a painless test that uses sound waves to create images of your heart. It provides your doctor with information about the size and shape of your heart and how well your heart's chambers and valves are working. This procedure takes approximately one hour. There are no restrictions for this procedure.  This will be done at our [facility] location:  [place] has requested that you have a lower extremity arterial duplex. This test is an ultrasound of the arteries in the legs. It looks at arterial blood flow in the legs. Allow one hour for Lower Arterial scans. There are no restrictions or special instructions  Follow-Up: Monday 12/2 at 11:20 AM

## 2018-04-14 ENCOUNTER — Encounter: Payer: Self-pay | Admitting: Cardiovascular Disease

## 2018-04-18 ENCOUNTER — Other Ambulatory Visit: Payer: Self-pay | Admitting: Cardiovascular Disease

## 2018-04-18 DIAGNOSIS — I739 Peripheral vascular disease, unspecified: Secondary | ICD-10-CM

## 2018-04-19 ENCOUNTER — Ambulatory Visit: Payer: Medicare Other | Admitting: Registered"

## 2018-04-24 ENCOUNTER — Other Ambulatory Visit (HOSPITAL_COMMUNITY): Payer: Medicare Other

## 2018-04-27 ENCOUNTER — Ambulatory Visit (HOSPITAL_COMMUNITY): Payer: Medicare Other | Attending: Cardiovascular Disease

## 2018-04-27 ENCOUNTER — Other Ambulatory Visit: Payer: Self-pay

## 2018-04-27 DIAGNOSIS — I428 Other cardiomyopathies: Secondary | ICD-10-CM | POA: Diagnosis present

## 2018-04-27 DIAGNOSIS — R0602 Shortness of breath: Secondary | ICD-10-CM | POA: Diagnosis present

## 2018-04-28 ENCOUNTER — Ambulatory Visit: Payer: Medicare Other | Admitting: Registered"

## 2018-05-02 ENCOUNTER — Ambulatory Visit (HOSPITAL_COMMUNITY)
Admission: RE | Admit: 2018-05-02 | Discharge: 2018-05-02 | Disposition: A | Discharge status: Home / Self Care | Payer: Medicare Other | Source: Ambulatory Visit | Attending: Internal Medicine | Admitting: Internal Medicine

## 2018-05-02 DIAGNOSIS — I739 Peripheral vascular disease, unspecified: Secondary | ICD-10-CM

## 2018-05-05 ENCOUNTER — Other Ambulatory Visit: Payer: Self-pay

## 2018-05-05 ENCOUNTER — Telehealth: Payer: Self-pay | Admitting: *Deleted

## 2018-05-05 MED ORDER — NONFORMULARY OR COMPOUNDED ITEM: 0 refills | Status: DC

## 2018-05-05 NOTE — Telephone Encounter (Signed)
Okay for refill?  

## 2018-05-05 NOTE — Telephone Encounter (Signed)
CE scheduled 06/16/18.

## 2018-05-05 NOTE — Telephone Encounter (Signed)
Called into pharmacy

## 2018-05-05 NOTE — Telephone Encounter (Signed)
Rx called in, pt aware

## 2018-05-05 NOTE — Telephone Encounter (Signed)
Patient called requesting refill for boric acid supp,annual exam scheduled on 06/16/18. Please advise

## 2018-05-10 ENCOUNTER — Ambulatory Visit: Payer: Medicare Other | Admitting: Registered"

## 2018-05-22 ENCOUNTER — Ambulatory Visit (INDEPENDENT_AMBULATORY_CARE_PROVIDER_SITE_OTHER): Payer: Medicare Other | Admitting: Cardiovascular Disease

## 2018-05-22 ENCOUNTER — Encounter: Payer: Self-pay | Admitting: Cardiovascular Disease

## 2018-05-22 VITALS — BP 126/72 | HR 66 | Ht 64.5 in | Wt 225.2 lb

## 2018-05-22 DIAGNOSIS — I428 Other cardiomyopathies: Secondary | ICD-10-CM | POA: Diagnosis not present

## 2018-05-22 DIAGNOSIS — M79605 Pain in left leg: Secondary | ICD-10-CM

## 2018-05-22 DIAGNOSIS — I10 Essential (primary) hypertension: Secondary | ICD-10-CM | POA: Diagnosis not present

## 2018-05-22 DIAGNOSIS — Z9989 Dependence on other enabling machines and devices: Secondary | ICD-10-CM

## 2018-05-22 DIAGNOSIS — G4733 Obstructive sleep apnea (adult) (pediatric): Secondary | ICD-10-CM

## 2018-05-22 DIAGNOSIS — D141 Benign neoplasm of larynx: Secondary | ICD-10-CM

## 2018-05-22 DIAGNOSIS — E782 Mixed hyperlipidemia: Secondary | ICD-10-CM

## 2018-05-22 DIAGNOSIS — M79604 Pain in right leg: Secondary | ICD-10-CM | POA: Diagnosis not present

## 2018-05-22 MED ORDER — CARVEDILOL PHOSPHATE ER 40 MG PO CP24: 40.0000 mg | ORAL_CAPSULE | Freq: Every evening | ORAL | 3 refills | Status: DC

## 2018-05-22 MED ORDER — VALSARTAN 160 MG PO TABS: 160.0000 mg | ORAL_TABLET | Freq: Two times a day (BID) | ORAL | 3 refills | Status: DC

## 2018-05-22 MED ORDER — OMEGA-3-ACID ETHYL ESTERS 1 G PO CAPS: 2.0000 g | ORAL_CAPSULE | Freq: Two times a day (BID) | ORAL | 3 refills | Status: DC

## 2018-05-22 MED ORDER — ATORVASTATIN CALCIUM 20 MG PO TABS: 20.0000 mg | ORAL_TABLET | Freq: Every day | ORAL | 3 refills | Status: DC

## 2018-05-22 MED ORDER — FUROSEMIDE 40 MG PO TABS: 40.0000 mg | ORAL_TABLET | Freq: Every day | ORAL | 3 refills | Status: DC

## 2018-05-22 MED ORDER — DIGOXIN 125 MCG PO TABS: 125.0000 ug | ORAL_TABLET | Freq: Every day | ORAL | 3 refills | Status: DC

## 2018-05-22 NOTE — Patient Instructions (Signed)
Medication Instructions:  Your physician recommends that you continue on your current medications as directed. Please refer to the Current Medication list given to you today.  If you need a refill on your cardiac medications before your next appointment, please call your pharmacy.   Follow-Up: At [facility], you and your health needs are our priority.  As part of our continuing mission to provide you with exceptional heart care, we have created designated Provider Care Teams.  These Care Teams include your primary Cardiologist (physician) and Advanced Practice Providers (APPs -  Physician Assistants and Nurse Practitioners) who all work together to provide you with the care you need, when you need it. You will need a follow up appointment in 6 months.  Please call our office 2 months in advance to schedule this appointment.  You may see Thomas Kelly, MD or one of the following Advanced Practice Providers on your designated Care Team: Hao Meng, PA-C . Angela Duke, PA-C   

## 2018-05-22 NOTE — Progress Notes (Signed)
Patient ID: Jamie Ayers, female   DOB: [birth year], 72 y.o.   MRN: [id number]     Primary  M.D.: Dr. Kevan Ny  HPI: Jamie Ayers is a 72 y.o. female who presents to the office for a 2 month cardiology evaluation.  .  Jamie Ayers has a remote history of a nonischemic cardiomyopathy.  In December 2005 her ejection fraction was 20%. With aggressive medical therapy LV function normalized to approximately 55%. She has a history of obstructive sleep apnea and has been utilizing CPAP therapy since 2008. 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 She also has undergone papilloma from a breast duct removal at the time of the hysterectomy.  An echo Doppler study on 03/20/2013 showed an ejection fraction at 50-55% with mild concentric left ventricular hypertrophy. She had normal diastolic function. There was evidence for mild aortic insufficiency and mild mitral regurg patient with a centrally directed jet. Her left atrium was mildly dilated. She recently had repeat laboratory done by Dr. Kevan Ny.  Laboratory on 10/05/2013 revealed hemoglobin11.6/ hematocrit 36.0.  She did have microcytic indices at 73.5.  She denies any history of sickle cell trait or thalassemia.  She is unaware of blood loss.  Glucose was elevated at 147.  She tells me in the past Dr. Marlou Sa had tried metformin but she did not tolerate this.  TSH was 1.054.  She is continuing to use CPAP with 100% compliance.  She denies daytime fatigue or awareness of breakthrough snoring.   On 01/28/2015 a follow-up echo Doppler study revealed an ejection fraction at 60-65% without wall motion abnormalities.  There was grade 1 diastolic dysfunction, mild aortic and mild mitral regurgitation.  The left atrium was mildly dilated.  Recently, she has noticed some mild myalgias in her legs.    She was seen by Dr. Percival Spanish in 2017 with complaints of fatigue and  some shortness of breath.  BNP was normal at 12.7.  Blood work by Dr. Marlou Sa from 03/02/2017revealed  a TSH of 0.65.  She had microcytic indices with an MCV of 73, hemoglobin 12.4.  Her creatinine had risen to 1.58 with a BUN of 44.  Calcium was mildly elevated at 10.9.  Saw her in follow-up one month later.  She continues to use CPAP therapy.  I was able to review a download from 08/13/2015 through 09/11/2015.  She has S9.  AutoSet CPAP ResMed unit, 95th percentile pressure is 15.5.  AHI is excellent at 0.5.  She is compliant with 97% of usage stays averaging approximately 8 hours sleep per night.    Repeat blood work revealed a BUN of 47 and creatinine of 1.24.  Her calcium was mildly increased at 10.8.  Lipid studies were markedly abnormal.  Apparently, she had stopped taking atorvastatin the past 2 months.  Her total cholesterol had risen to 297, triglycerides 303, HDL 39, VLDL 61, and LDL 197.  Her hemoglobin is 11.5 with hematocrit 36.1.  She has had consistent microcytosis.  TSH remained normal at 1.19.    She was hospitalized at [facility] with pancreatitis.  She had developed significant vomiting and nausea, and on 06/17/2016 she was seen at urgent care and was found to have an amylase of 1120 and a lipase of 624.  Her Januvia was stopped as well, as Zetia.   When I saw her in F/U she denied any chest pain or  shortness of  breath.  She has noticed some difficulty with her legs and feet.  She notes cramping when she walks but at times note some muscle aches, not exertionally.  She also has noticed a vague nonexertional short-lived chest sensation which resolved spontaneously.  She does note some occasional ankle swelling.  She has been using CPAP with 100% compliance and can't sleep without it.    She received a new CPAP machine on June 13, 2017.  A download was obtained in the office today from February 10 - August 29, 2017.  She is meeting Medicare compliance standards with 90% of usage days.  Usage  greater than 4 hours was 26 days or 87%.  Days that she did not use her machine was when she had received a soap clean machine and not realize when she was out of town the reservoir requirement.  She is averaging 8 hours and 30 minutes of CPAP use per night.  She has been set on a auto CPAP minimum pressure of 8 and a maximum of 20.  AHI is 0.6.  There is no significant leak.  She is requiring high pressures with 95th percentile pressure at 18.2 with maximum average pressure at 19.2.  She is unaware of breakthrough snoring.  Her sleep is restorative.  She continues to experience times sleepiness and Epworth sleepiness scale score was endorsed  and calculated at 18.  She has difficulty with sleep initiation.  She often goes to bed at 11 PM but cannot fall asleep for several hours and at times is still awake at 2 AM.  She has a peripheral neuropathy.    When seen  in March 2019 she was having more fatigue and a download of her CPAP unit indicated that she was requiring very high pressures.  As result I changed her CPAP minimum pressure from 8 up to 11 cm of water pressure with a maximum of 20.  She had  called our office with complaints of increasing fatigue and shortness of breath with exertion.  She denies chest tightness fever chills or night sweats.  She was worked into my office on April 13, 2018 and admitted to illness in her feet, paresthesias from neuropathy and was having issues with back discomfort and lumbar disc disease.  She was having issues with spinal stenosis and had received a short course of prednisone therapy.  She admitted to exertional dyspnea and palpitations.  She was using CPAP with 100% compliance.   She underwent an echo Doppler study on April 27, 2018 which continued to show normal LV function with an EF of 60 to 65%.  There was grade 1 diastolic dysfunction and mild aortic regurgitation.  Lower extremity Doppler study with ABIs that were similar to her prior study 1 year  previously.  ABIs were within normal limits.  She has continued to use CPAP with 100% compliance.  A download was obtained in the office today from November 2 through May 21, 2018.  She has an air sense 10 AutoSet unit with a minimum pressure at 11 with maximum up to 20.  Her 95th percentile pressure is 18.7 with a maximum pressure of 19.4.  AHI is excellent at 0.6.  She presents for reevaluation.   Past Medical History:  Diagnosis Date  . Anemia   . Anxiety disorder   . Asthmatic bronchitis   . Benign essential HTN 08/06/11   ECHO-EF>55%  . CHF (congestive heart failure) (HCC)    HeFPEF  . Diabetes mellitus without complication (  [place])   . Disorder of vocal cord   . GERD (gastroesophageal reflux disease)   . Hernia   . Hyperlipidemia 03/14/12   Lexiscan myoview-WNL; unchanged from previous study.  . OSA on CPAP 03/28/07   sleep study-[facility]-AHI 9.42/HR and during REM sleep '@37' .50/hr. The average 02 sat duringREM AND NREM was 97.0%  . Thyroid disease   . Upper respiratory infection     Past Surgical History:  Procedure Laterality Date  . ABDOMINAL HYSTERECTOMY  2013   TAH BSO  . BREAST EXCISIONAL BIOPSY Left 2017   papilloma  . BREAST RECONSTRUCTION Bilateral    Patient had fat pads removed from both sides   . BREAST SURGERY     Papilloma  . DILATION AND CURETTAGE OF UTERUS    . ENDOMETRIAL BIOPSY    . HERNIA REPAIR    . HYSTEROSCOPY    . KNEE SURGERY     arthroscopic  . NISSEN FUNDOPLICATION    . OOPHORECTOMY     BSO  . Parathyroid surg    . Vocal Cord Surg      Allergies  Allergen Reactions  . Midazolam Other (See Comments)    REACTION: cardiac arrest  . Midazolam Hcl     Cardiac arrest    VERSED  . Codeine Hives and Itching  . Morphine And Related Hives  . Paba Derivatives Nausea Only    PT REPORTS SEVERE NAUSEA AFTER SURGERY, DENIES VOMITING  . Sulfonamide Derivatives Hives and Itching  . Sulfamethoxazole Hives    Current  Outpatient Medications  Medication Sig Dispense Refill  . albuterol (PROVENTIL HFA;VENTOLIN HFA) 108 (90 BASE) MCG/ACT inhaler Inhale 2 puffs into the lungs every 4 (four) hours as needed for wheezing. 3.7 g 0  . atorvastatin (LIPITOR) 20 MG tablet Take 1 tablet (20 mg total) by mouth daily. 90 tablet 3  . Calcium Carbonate-Vitamin D (CALCIUM + D PO) Take 1 tablet by mouth daily.     . carvedilol (COREG CR) 40 MG 24 hr capsule Take 1 capsule (40 mg total) by mouth every evening. 90 capsule 3  . cycloSPORINE (RESTASIS) 0.05 % ophthalmic emulsion Place 1 drop into both eyes daily.    . digoxin (LANOXIN) 0.125 MG tablet Take 1 tablet (125 mcg total) by mouth daily. 90 tablet 3  . esomeprazole (NEXIUM) 40 MG capsule Take 1 capsule (40 mg total) by mouth 2 (two) times daily before a meal. 180 capsule 1  . furosemide (LASIX) 40 MG tablet Take 1 tablet (40 mg total) by mouth daily. 90 tablet 3  . montelukast (SINGULAIR) 10 MG tablet Take 10 mg by mouth at bedtime.    . Multiple Vitamin (MULTIVITAMIN) tablet Take 1 tablet by mouth daily.      . nitrofurantoin, macrocrystal-monohydrate, (MACROBID) 100 MG capsule Take 1 capsule (100 mg total) by mouth 2 (two) times daily. For 7 days 14 capsule 0  . NON FORMULARY CPAP THERAPY    . NONFORMULARY OR COMPOUNDED ITEM Boric acid vaginal suppositories 600 mg one per vagina as needed for yeast 30 each 0  . omega-3 acid ethyl esters (LOVAZA) 1 g capsule Take 2 capsules (2 g total) by mouth 2 (two) times daily. 360 capsule 3  . Oxycodone HCl 10 MG TABS Take 1 tablet by mouth as needed.  0  . polyethylene glycol (MIRALAX / GLYCOLAX) packet Take 17 g by mouth daily.    . valsartan (DIOVAN) 160 MG tablet Take 1 tablet (160 mg  total) by mouth 2 (two) times daily. 180 tablet 3  . zolpidem (AMBIEN) 10 MG tablet Take 1 tablet (10 mg total) by mouth at bedtime as needed. For sleep 30 tablet 3   No current facility-administered medications for this visit.     Social  History   Socioeconomic History  . Marital status: Married    Spouse name: Not on file  . Number of children: Not on file  . Years of education: Not on file  . Highest education level: Not on file  Occupational History  . Not on file  Social Needs  . Financial resource strain: Not on file  . Food insecurity:    Worry: Not on file    Inability: Not on file  . Transportation needs:    Medical: Not on file    Non-medical: Not on file  Tobacco Use  . Smoking status: Never Smoker  . Smokeless tobacco: Never Used  Substance and Sexual Activity  . Alcohol use: No    Alcohol/week: 0.0 standard drinks  . Drug use: No  . Sexual activity: Yes    Birth control/protection: Post-menopausal, Surgical    Comment: HYST-1st intercourse 72 yo-Fewer than 5 partners,DES NEG  Lifestyle  . Physical activity:    Days per week: Not on file    Minutes per session: Not on file  . Stress: Not on file  Relationships  . Social connections:    Talks on phone: Not on file    Gets together: Not on file    Attends religious service: Not on file    Active member of club or organization: Not on file    Attends meetings of clubs or organizations: Not on file    Relationship status: Not on file  . Intimate partner violence:    Fear of current or ex partner: Not on file    Emotionally abused: Not on file    Physically abused: Not on file    Forced sexual activity: Not on file  Other Topics Concern  . Not on file  Social History Narrative  . Not on file    Family History  Problem Relation Age of Onset  . Diabetes Father   . Hypertension Father   . Cancer Father        Prostate cancer  . Arthritis Father   . Gout Father   . Hypertension Mother   . Stroke Mother   . Breast cancer Maternal Grandmother        Age 7's  . Ovarian cancer Paternal Grandmother   . Breast cancer Sister        Age 68  . Kidney disease Sister    Social history is notable in that she is married has 4 children 9  grandchildren. There is no tobacco or alcohol use.  ROS General: Negative; No fevers, chills, or night sweats; positive for fatigue HEENT: Occasional hoarseness.  In the past she has had difficulty with vocal cord papillomas. No changes in vision or hearing, sinus congestion, difficulty swallowing Pulmonary: Negative; No cough, wheezing, shortness of breath, hemoptysis Cardiovascular: Negative; No chest pain, presyncope, syncope, palpitations GI: Negative; No nausea, vomiting, diarrhea, or abdominal pain GU: Negative; No dysuria, hematuria, or difficulty voiding Musculoskeletal: Positive for mild myalgias; spinal stenosis Hematologic/Oncology: Negative; no easy bruising, bleeding Endocrine: Positive for diabetes mellitus.  No cold or heat intolerance. Neuro: Negative; no changes in balance, headaches Skin: Negative; No rashes or skin lesions Psychiatric: Negative; No behavioral problems, depression Sleep: Positive for  sleep apnea, for which she uses CPAP every night.  No residual snoring, daytime sleepiness, hypersomnolence, bruxism, restless legs, hypnogognic hallucinations, no cataplexy Other comprehensive 14 point system review is negative.   PE BP 126/72   Pulse 66   Ht 5' 4.5" (1.638 m)   Wt 225 lb 3.2 oz (102.2 kg)   BMI 38.06 kg/m    Repeat blood pressure by me was excellent at 124/70  Wt Readings from Last 3 Encounters:  05/22/18 225 lb 3.2 oz (102.2 kg)  04/13/18 230 lb 6.4 oz (104.5 kg)  08/29/17 225 lb (102.1 kg)   General: Alert, oriented, no distress.  Obesity Skin: normal turgor, no rashes, warm and dry HEENT: Normocephalic, atraumatic. Pupils equal round and reactive to light; sclera anicteric; extraocular muscles intact; Nose without nasal septal hypertrophy Mouth/Parynx benign; Mallinpatti scale 3/4 Neck: No JVD, no carotid bruits; normal carotid upstroke Lungs: clear to ausculatation and percussion; no wheezing or rales Chest wall: without tenderness to  palpitation Heart: PMI not displaced, RRR, s1 s2 normal, 1/6 systolic murmur, no diastolic murmur, no rubs, gallops, thrills, or heaves Abdomen: soft, nontender; no hepatosplenomehaly, BS+; abdominal aorta nontender and not dilated by palpation. Back: no CVA tenderness Pulses 2+ Musculoskeletal: full range of motion, normal strength, no joint deformities Extremities: no clubbing cyanosis or edema, Homan's sign negative  Neurologic: grossly nonfocal; Cranial nerves grossly wnl Psychologic: Normal mood and affect   ECG (independently read by me): Normal sinus rhythm at 66 bpm.  First-degree AV block with appeared normal to 10 ms.  LVH by voltage criteria.  No ectopy.  October 2019 ECG (independently read by me): Normal sinus rhythm at 70 bpm.  LVH by voltage criteria probable left atrial enlargement.  Non-specific ST changes.  September 2018 ECG (independently read by me): Normal sinus rhythm at 64 bpm.  First-degree AV block.  PR interval 218 ms.  LVH by voltage criteria in aVL.  January 2018 ECG (independently read by me): Sinus bradycardia with first-degree AV block.  LVH by voltage criteria.  No significant ST-T changes.  October 2017 ECG (independently read by me): Normal sinus rhythm at 61 bpm.  LVH by voltage.  Borderline first-degree AV block with PR interval 206 ms.  April 2017 ECG (independently read by me): Normal sinus rhythm at 64 bpm.  First-degree AV block with a PR interval of 218 ms.  ECG (independently read by me): Normal sinus rhythm at 68 bpm.  LVH by voltage in aVL.  July 2016 ECG (independently read by me): Normal sinus rhythm at 64 bpm..  Borderline LVH.  No cigarette ST changes.  August 2015 ECG (independently read by me): Normal sinus rhythm at 67 beats per minute.  Moderate voltage criteria for LVH.  No significant ST changes.  Borderline first degree AV block with a PR interval at 208 ms.  10/08/2013 ECG (independently read by me): Sinus rhythm at 59 beats per  minute.  Borderline first-degree block with PR interval of 220 ms.  QTc interval normal.  Prior 04/17/2013 ECG: Normal sinus rhythm at 62 beats per minute. Borderline first degree AV block is no longer present with her PR interval now at 184 ms.  LABS: BMP Latest Ref Rng & Units 06/15/2016 04/05/2016 10/05/2013  Glucose 65 - 99 mg/dL 150(H) 139(H) 147(H)  BUN 8 - 27 mg/dL 29(H) 47(H) 17  Creatinine 0.57 - 1.00 mg/dL 1.33(H) 1.24(H) 0.95  BUN/Creat Ratio 12 - 28 22 - -  Sodium 134 - 144 mmol/L 137 140  141  Potassium 3.5 - 5.2 mmol/L 5.0 4.4 4.0  Chloride 96 - 106 mmol/L 103 102 102  CO2 18 - 29 mmol/L '21 28 29  ' Calcium 8.7 - 10.3 mg/dL 10.1 10.8(H) 9.6    Hepatic Function Latest Ref Rng & Units 06/15/2016 04/05/2016 10/05/2013  Total Protein 6.0 - 8.5 g/dL 7.3 7.7 6.9  Albumin 3.5 - 4.8 g/dL 4.4 4.4 4.1  AST 0 - 40 IU/L '17 15 14  ' ALT 0 - 32 IU/L '15 14 20  ' Alk Phosphatase 39 - 117 IU/L 83 87 86  Total Bilirubin 0.0 - 1.2 mg/dL 1.3(H) 0.7 0.8   CBC Latest Ref Rng & Units 06/15/2016 06/15/2016 04/05/2016  WBC 4.6 - 10.2 K/uL 20.2(A) 20.2(HH) 7.3  Hemoglobin 12.2 - 16.2 g/dL 11.4(A) 11.5 11.5(L)  Hematocrit 37.7 - 47.9 % 34.7(A) 34.1 36.1  Platelets 150 - 379 x10E3/uL 230 - 264   Lab Results  Component Value Date   MCV 72.8 (A) 06/15/2016   MCV 70 (L) 06/15/2016   MCV 72.3 (L) 04/05/2016   Lab Results  Component Value Date   TSH 1.19 04/05/2016   Lab Results  Component Value Date   HGBA1C 8.4 (H) 10/08/2013   Lipid Panel     Component Value Date/Time   CHOL 177 07/14/2016 1031   TRIG 186 (H) 07/14/2016 1031   HDL 36 (L) 07/14/2016 1031   CHOLHDL 4.9 07/14/2016 1031   VLDL 37 (H) 07/14/2016 1031   LDLCALC 104 (H) 07/14/2016 1031    RADIOLOGY: No results found.  IMPRESSION: 1. NICM (nonischemic cardiomyopathy) ([place])   2. OSA on CPAP   3. Benign essential HTN   4. Pain in both lower extremities   5. Morbid obesity ([place])   6. Mixed hyperlipidemia   7. Papilloma  of larynx     ASSESSMENT AND PLAN: Jamie Ayers is a 72 year old African-American female who has a history of morbid obesity and a history of a nonischemic cardiomyopathy with an ejection fraction of 20% initially documented in 2005 with subsequent normalization. Her echo Doppler study in August 2016  showed normal systolic function with an EF of 60 to 60% and there was grade 1 diastolic dysfunction.  There was mild AR and mild MR.  Her left atrium was mildly dilated. A nuclear perfusion study in September 2013 showed normal perfusion without scar or ischemia.  She has severe sleep apnea and continues to utilize her CPAP therapy.she admits to 100% compliance.  I reviewed her most recent echo Doppler study from April 27, 2018 and this continues to be stable with an EF of 60 to 65% without wall motion abnormalities.  There is grade 1 diastolic dysfunction and mild aortic insufficiency.  When last seen she was complaining of cold lower extremities with questionable claudication symptoms.  I reviewed her lower extremity Doppler assessment which essentially was unchanged from previously and she had normal ABIs.  Her blood pressure today is stable on her current regimen consisting of Coreg SR 40 mg, furosemide 40 mg, valsartan 160 mg.  He has continued to be on low-dose digoxin which may be able to be discontinued.  I reviewed her download.  She is requiring high pressures of CPAP therapy with her 95th percentile pressure at 18.7 and maximum average pressure at 19.4, but AHI is excellent at 0.6.  She continues to be obese with a BMI of 38.06.  She is on atorvastatin for hyperlipidemia and tolerates this well without myalgias.  She will be having papillomas  removed from her vocal cords later this week by Dr. Denyse Dago at [facility].  I will see her in 6 months for cardiology reevaluation.  Time spent: 25 minutes Troy Sine, MD, [facility]  05/24/2018 8:15 AM

## 2018-05-24 ENCOUNTER — Encounter: Payer: Self-pay | Admitting: Cardiovascular Disease

## 2018-06-16 ENCOUNTER — Encounter: Payer: Medicare Other | Admitting: Gynecology

## 2018-06-16 DIAGNOSIS — Z0289 Encounter for other administrative examinations: Secondary | ICD-10-CM

## 2018-06-25 ENCOUNTER — Other Ambulatory Visit: Payer: Self-pay | Admitting: Cardiology

## 2018-06-25 MED ORDER — CARVEDILOL PHOSPHATE ER 40 MG PO CP24: 40.0000 mg | ORAL_CAPSULE | Freq: Every evening | ORAL | 0 refills | Status: DC

## 2018-06-25 NOTE — Progress Notes (Signed)
Pt is out of her carvedilol and has not received new supply from mail order. I have sent in 30 days to [facility].

## 2018-07-15 ENCOUNTER — Other Ambulatory Visit: Payer: Self-pay | Admitting: Cardiology

## 2018-07-17 NOTE — Telephone Encounter (Signed)
This is Dr. Kelly's pt. °

## 2018-08-02 ENCOUNTER — Ambulatory Visit (INDEPENDENT_AMBULATORY_CARE_PROVIDER_SITE_OTHER): Payer: Medicare Other | Admitting: Gynecology

## 2018-08-02 ENCOUNTER — Encounter: Payer: Self-pay | Admitting: Gynecology

## 2018-08-02 VITALS — BP 126/84 | Ht 63.0 in | Wt 220.0 lb

## 2018-08-02 DIAGNOSIS — Z01419 Encounter for gynecological examination (general) (routine) without abnormal findings: Secondary | ICD-10-CM

## 2018-08-02 DIAGNOSIS — N952 Postmenopausal atrophic vaginitis: Secondary | ICD-10-CM | POA: Diagnosis not present

## 2018-08-02 DIAGNOSIS — N898 Other specified noninflammatory disorders of vagina: Secondary | ICD-10-CM | POA: Diagnosis not present

## 2018-08-02 DIAGNOSIS — N3000 Acute cystitis without hematuria: Secondary | ICD-10-CM

## 2018-08-02 LAB — WET PREP FOR TRICH, YEAST, CLUE

## 2018-08-02 MED ORDER — FLUCONAZOLE 150 MG PO TABS: 150.0000 mg | ORAL_TABLET | Freq: Once | ORAL | 1 refills | Status: AC

## 2018-08-02 MED ORDER — NITROFURANTOIN MONOHYD MACRO 100 MG PO CAPS: 100.0000 mg | ORAL_CAPSULE | Freq: Two times a day (BID) | ORAL | 0 refills | Status: DC

## 2018-08-02 NOTE — Patient Instructions (Signed)
Take the Macrodantin antibiotic twice daily for 7 days.  Take a Diflucan pill for the vaginal itching.  Follow-up for the bone density as scheduled

## 2018-08-02 NOTE — Addendum Note (Signed)
Addended by: Nelva Nay on: 08/02/2018 02:26 PM   Modules accepted: Orders

## 2018-08-02 NOTE — Progress Notes (Signed)
    Jamie Ayers 1945-11-19 [id number]        73 y.o.  [id number] for breast and pelvic exam.  Patient also notes some vaginal discharge with irritation of the last several weeks.  Also has urinary frequency.  No dysuria urgency low back pain fever or chills.  She is having problems regulating her glucoses.  Past medical history,surgical history, problem list, medications, allergies, family history and social history were all reviewed and documented as reviewed in the EPIC chart.  ROS:  Performed with pertinent positives and negatives included in the history, assessment and plan.   Additional significant findings : None   Exam: Jamie Ayers assistant Vitals:   08/02/18 1158  BP: 126/84  Weight: 220 lb (99.8 kg)  Height: 5\' 3"  (1.6 m)   Body mass index is 38.97 kg/m.  General appearance:  Normal affect, orientation and appearance. Skin: Grossly normal HEENT: Without gross lesions.  No cervical or supraclavicular adenopathy. Thyroid normal.  Lungs:  Clear without wheezing, rales or rhonchi Cardiac: RR, without RMG Abdominal:  Soft, nontender, without masses, guarding, rebound, organomegaly or hernia Breasts:  Examined lying and sitting without masses, retractions, discharge or axillary adenopathy. Pelvic:  Ext, BUS, Vagina: With atrophic changes  Adnexa: Without masses or tenderness    Anus and perineum: Normal   Rectovaginal: Normal sphincter tone without palpated masses or tenderness.    Assessment/Plan:  73 y.o. [name] female for breast and pelvic exam  1. Vaginal irritation.  History of recurrent yeast infections in the past.  Exam with scant discharge today.  Wet prep is negative.  Will cover with Diflucan 150 mg x 1 dose.  I gave her a total of 5 to have available with 1 refill as she does get yeast infections throughout the year. 2. Urinary frequency.  Urine analysis is consistent with UTI.  Will treat with Macrobid 100 mg twice daily x7 days.  Follow-up if symptoms  persist, worsen or recur. 3. Postmenopausal.  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  No significant menopausal symptoms. 4. Mammography coming due in April and I reminded her to schedule this.  Breast exam normal today.  She does have a strong family history of breast cancer and questionable ovarian cancer.  Had discussed and recommended genetic counseling last year.  Patient decided against this and does not want referral.  Breast exam normal today. 5. Pap smear 2016.  No Pap smear done today.  No history of abnormal Pap smears.  Reviewed current screening guidelines and we both agree to stop screening based on age and hysterectomy history. 6. Colonoscopy 2016.  Repeat at their recommended interval. 7. DEXA 2015 normal.  Recommend follow-up DEXA now at 5-year interval and patient agrees to schedule. 8. Health maintenance.  No routine lab work drawn.  Patient has blood work done through her primary physician.  Follow-up 1 year, sooner as needed.  Additional time in excess of her breast and pelvic exam was spent in direct face to face counseling and coordination of care in regards to her vaginitis symptoms and UTI.    Anastasio Auerbach MD, 12:26 PM 08/02/2018

## 2018-08-04 LAB — URINE CULTURE
MICRO NUMBER:: 190877
Result:: NO GROWTH
SPECIMEN QUALITY:: ADEQUATE

## 2018-08-04 LAB — URINALYSIS, COMPLETE W/RFL CULTURE
Bilirubin Urine: NEGATIVE
HYALINE CAST: NONE SEEN /LPF
Hgb urine dipstick: NEGATIVE
Ketones, ur: NEGATIVE
Nitrites, Initial: NEGATIVE
RBC / HPF: NONE SEEN /HPF (ref 0–2)
Specific Gravity, Urine: 1.01 (ref 1.001–1.03)
pH: 7 (ref 5.0–8.0)

## 2018-08-04 LAB — CULTURE INDICATED

## 2018-08-11 ENCOUNTER — Other Ambulatory Visit: Payer: Self-pay | Admitting: Gynecology

## 2018-08-11 ENCOUNTER — Telehealth: Payer: Self-pay | Admitting: *Deleted

## 2018-08-11 DIAGNOSIS — Z1231 Encounter for screening mammogram for malignant neoplasm of breast: Secondary | ICD-10-CM

## 2018-08-11 DIAGNOSIS — N644 Mastodynia: Secondary | ICD-10-CM

## 2018-08-11 NOTE — Telephone Encounter (Signed)
Okay for right breast diagnostic mammography and ultrasound reference breast pain with normal exam

## 2018-08-11 NOTE — Telephone Encounter (Signed)
Orders placed at breast center, patient will call to schedule.

## 2018-08-11 NOTE — Telephone Encounter (Signed)
Patient called c/o right breast nipple and breast tenderness requesting orders at breast center to [place] [place]. I explained OV, patient was seen for annual last week and normal breast exam. Patient said she forgot to mention breast tenderness. Declined to schedule visit with appointment. Please advise

## 2018-08-14 NOTE — Telephone Encounter (Signed)
Appointment on 08/17/18 @ 1:40pm

## 2018-08-15 ENCOUNTER — Other Ambulatory Visit: Payer: Self-pay | Admitting: Cardiovascular Disease

## 2018-08-15 NOTE — Telephone Encounter (Signed)
°*  STAT* If patient is at the pharmacy, call can be transferred to refill team.   1. Which medications need to be refilled? (please list name of each medication and dose if known) Esomeprazole 40 mg  2. Which pharmacy/location (including street and city if local pharmacy) is medication to be sent to? Meds by mail   3. Do they need a 30 day or 90 day supply? 90 patient would like to can her other medication be check as well.

## 2018-08-15 NOTE — Telephone Encounter (Signed)
LMTCB... who is following her GI meds and what other RX does she need sent in?

## 2018-08-16 NOTE — Telephone Encounter (Signed)
UNABLE TO LMVM

## 2018-08-17 ENCOUNTER — Other Ambulatory Visit: Payer: Medicare Other

## 2018-08-22 ENCOUNTER — Other Ambulatory Visit: Payer: Medicare Other

## 2018-09-13 ENCOUNTER — Other Ambulatory Visit: Payer: Self-pay | Admitting: Cardiovascular Disease

## 2018-09-13 MED ORDER — ESOMEPRAZOLE MAGNESIUM 40 MG PO CPDR: 40.0000 mg | DELAYED_RELEASE_CAPSULE | Freq: Two times a day (BID) | ORAL | 1 refills | Status: DC

## 2018-09-13 NOTE — Telephone Encounter (Signed)
°*  STAT* If patient is at the pharmacy, call can be transferred to refill team.   1. Which medications need to be refilled? (please list name of each medication and dose if known) Esomeprazole  2. Which pharmacy/location (including street and city if local pharmacy) is medication to be sent to? Meds by Mail  3. Do they need a 30 day or 90 day supply? 180 and refills

## 2018-09-21 ENCOUNTER — Ambulatory Visit: Payer: Medicare Other

## 2018-09-21 ENCOUNTER — Other Ambulatory Visit: Payer: Medicare Other

## 2018-09-21 ENCOUNTER — Other Ambulatory Visit: Payer: Self-pay

## 2018-09-21 ENCOUNTER — Ambulatory Visit
Admission: RE | Admit: 2018-09-21 | Discharge: 2018-09-21 | Disposition: A | Discharge status: Home / Self Care | Payer: Medicare Other | Source: Ambulatory Visit | Attending: Gynecology | Admitting: Gynecology

## 2018-09-21 DIAGNOSIS — N644 Mastodynia: Secondary | ICD-10-CM

## 2018-10-03 ENCOUNTER — Ambulatory Visit (INDEPENDENT_AMBULATORY_CARE_PROVIDER_SITE_OTHER): Payer: Medicare Other | Admitting: Gynecology

## 2018-10-03 ENCOUNTER — Encounter: Payer: Self-pay | Admitting: Gynecology

## 2018-10-03 ENCOUNTER — Other Ambulatory Visit: Payer: Self-pay

## 2018-10-03 DIAGNOSIS — N3000 Acute cystitis without hematuria: Secondary | ICD-10-CM | POA: Diagnosis not present

## 2018-10-03 MED ORDER — NITROFURANTOIN MONOHYD MACRO 100 MG PO CAPS: 100.0000 mg | ORAL_CAPSULE | Freq: Two times a day (BID) | ORAL | 0 refills | Status: DC

## 2018-10-03 NOTE — Patient Instructions (Signed)
Take the antibiotic twice daily for 7 days.  Follow-up if your symptoms persist or worsen.

## 2018-10-03 NOTE — Progress Notes (Signed)
    MIKEYA TOMASETTI 1946/06/03 [id number]        73 y.o.  [id number] patient calls from her home to my office for a telemedicine consult.  She agrees to the telemedicine encounter understanding the limitations.  She was identified by 2 identifiers.  The patient notes urinary frequency starting last week which has progressively gotten worse now with urgency, suprapubic pressure and discomfort with voiding and low back pain.  Has felt feverish but did not take her temperature.  Is using Tylenol for the discomfort.  No significant vaginal discharge, irritation or odor.  Was treated for UTI in February with Macrodantin with good results and resolution of her symptoms although her urine culture did not grow out a significant bacteria.  Past medical history,surgical history, problem list, medications, allergies, family history and social history were all reviewed and documented in the EPIC chart.  Directed ROS with pertinent positives and negatives documented in the history of present illness/assessment and plan.   Assessment/Plan:  73 y.o. [id number] with history consistent with UTI.  Options for management were reviewed and ultimately will go ahead with Macrobid 100 mg twice daily x7 days since she did well with this previously.  ASAP call precautions were reviewed and she will follow-up if her symptoms do not resolve.  12 minutes of my time was spent in direct patient communication and review of her medical records with ultimate prescription of medication.   Anastasio Auerbach MD, 12:36 PM 10/03/2018

## 2018-11-07 ENCOUNTER — Telehealth: Payer: Self-pay | Admitting: Cardiovascular Disease

## 2018-11-07 NOTE — Telephone Encounter (Signed)
Mychart pending, smartphone, consent, pre reg complete 11/07/18 AF

## 2018-11-09 ENCOUNTER — Telehealth (INDEPENDENT_AMBULATORY_CARE_PROVIDER_SITE_OTHER): Payer: Medicare Other | Admitting: Cardiovascular Disease

## 2018-11-09 ENCOUNTER — Encounter: Payer: Self-pay | Admitting: Cardiovascular Disease

## 2018-11-09 VITALS — Ht 64.5 in | Wt 215.0 lb

## 2018-11-09 DIAGNOSIS — E782 Mixed hyperlipidemia: Secondary | ICD-10-CM

## 2018-11-09 DIAGNOSIS — Z9989 Dependence on other enabling machines and devices: Secondary | ICD-10-CM

## 2018-11-09 DIAGNOSIS — I428 Other cardiomyopathies: Secondary | ICD-10-CM | POA: Diagnosis not present

## 2018-11-09 DIAGNOSIS — I10 Essential (primary) hypertension: Secondary | ICD-10-CM | POA: Diagnosis not present

## 2018-11-09 DIAGNOSIS — G4733 Obstructive sleep apnea (adult) (pediatric): Secondary | ICD-10-CM

## 2018-11-09 DIAGNOSIS — E118 Type 2 diabetes mellitus with unspecified complications: Secondary | ICD-10-CM
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
## 2018-11-09 NOTE — Patient Instructions (Addendum)
Medication Instructions:  TAKE COREG IN THE AM  DECREASE DIGOXIN-TAKE 1/2 OF THE 0.125MG  TABLET DAILY   If you need a refill on your cardiac medications before your next appointment, please call your pharmacy.  Follow-Up: You will need a follow up appointment in 6 months.  Please call our office 2 months in advance, September 2020 to schedule this NOVEMBER 2020  appointment.  You may see Shelva Majestic, MD or one of the following Advanced Practice Providers on your designated Care Team:  Almyra Deforest, PA-C  Fabian Sharp, PA-C   At [facility], you and your health needs are our priority.  As part of our continuing mission to provide you with exceptional heart care, we have created designated Provider Care Teams.  These Care Teams include your primary Cardiologist (physician) and Advanced Practice Providers (APPs -  Physician Assistants and Nurse Practitioners) who all work together to provide you with the care you need, when you need it.  Thank you for choosing [facility] at [facility]!!

## 2019-02-02 ENCOUNTER — Telehealth: Payer: Self-pay | Admitting: *Deleted

## 2019-02-02 ENCOUNTER — Other Ambulatory Visit: Payer: Self-pay

## 2019-02-02 NOTE — Telephone Encounter (Signed)
Patient called asking for return call I called and her phone and was unable to leave a voicemail because it was full.

## 2019-02-05 ENCOUNTER — Other Ambulatory Visit: Payer: Self-pay

## 2019-02-05 ENCOUNTER — Encounter: Payer: Self-pay | Admitting: Gynecology

## 2019-02-05 ENCOUNTER — Ambulatory Visit (INDEPENDENT_AMBULATORY_CARE_PROVIDER_SITE_OTHER): Payer: Medicare Other | Admitting: Gynecology

## 2019-02-05 VITALS — BP 124/82

## 2019-02-05 DIAGNOSIS — R350 Frequency of micturition: Secondary | ICD-10-CM | POA: Diagnosis not present

## 2019-02-05 DIAGNOSIS — N898 Other specified noninflammatory disorders of vagina: Secondary | ICD-10-CM | POA: Diagnosis not present

## 2019-02-05 LAB — WET PREP FOR TRICH, YEAST, CLUE

## 2019-02-05 NOTE — Patient Instructions (Signed)
Take another dose of Diflucan.  Call if your symptoms persist over the next several days.

## 2019-02-05 NOTE — Progress Notes (Signed)
    [place] JON [birth year] [id number]        72 y.o.  [id number] presents with vaginal discharge and odor over the past week.  Took a Diflucan last week and notes improvement in her symptoms but not totally gone.  Some urinary frequency.  No dysuria urgency low back pain fever or chills.  Past medical history,surgical history, problem list, medications, allergies, family history and social history were all reviewed and documented in the EPIC chart.  Directed ROS with pertinent positives and negatives documented in the history of present illness/assessment and plan.  Exam: [name] assistant Vitals:   02/05/19 1213  BP: 124/82   General appearance:  Normal Abdomen soft nontender without masses guarding rebound Pelvic external BUS vagina with atrophic changes.  Scant discharge noted.  Bimanual without masses or tenderness.  Assessment/Plan:  73 y.o. [id number] with history and exam as above.  Wet prep is negative.  Historically sounds as if patient has yeast with partial improvement after Diflucan.  She has Diflucan at home and I recommended she take another dose of the Diflucan.  If her symptoms persist despite this she will call.  Urine analysis is negative today.  If frequency continues will follow-up for recheck.    Anastasio Auerbach MD, 2:02 PM 02/05/2019

## 2019-02-07 LAB — URINALYSIS, COMPLETE W/RFL CULTURE
Bacteria, UA: NONE SEEN /HPF
Bilirubin Urine: NEGATIVE
Hgb urine dipstick: NEGATIVE
Hyaline Cast: NONE SEEN /LPF
Ketones, ur: NEGATIVE
Nitrites, Initial: NEGATIVE
Protein, ur: NEGATIVE
RBC / HPF: NONE SEEN /HPF (ref 0–2)
Specific Gravity, Urine: 1.01 (ref 1.001–1.03)
pH: 6 (ref 5.0–8.0)

## 2019-02-07 LAB — URINE CULTURE
MICRO NUMBER:: 783191
SPECIMEN QUALITY:: ADEQUATE

## 2019-02-07 LAB — CULTURE INDICATED

## 2019-03-07 ENCOUNTER — Telehealth: Payer: Self-pay | Admitting: Cardiovascular Disease

## 2019-03-07 MED ORDER — ATORVASTATIN CALCIUM 20 MG PO TABS: 20.0000 mg | ORAL_TABLET | Freq: Every day | ORAL | 0 refills | Status: DC

## 2019-03-07 MED ORDER — FUROSEMIDE 40 MG PO TABS: 40.0000 mg | ORAL_TABLET | Freq: Every day | ORAL | 0 refills | Status: DC

## 2019-03-07 MED ORDER — VALSARTAN 160 MG PO TABS: 160.0000 mg | ORAL_TABLET | Freq: Two times a day (BID) | ORAL | 0 refills | Status: DC

## 2019-03-07 MED ORDER — ESOMEPRAZOLE MAGNESIUM 40 MG PO CPDR: 40.0000 mg | DELAYED_RELEASE_CAPSULE | Freq: Two times a day (BID) | ORAL | 0 refills | Status: DC

## 2019-03-07 MED ORDER — CARVEDILOL PHOSPHATE ER 40 MG PO CP24: ORAL_CAPSULE | ORAL | 0 refills | Status: DC

## 2019-03-07 NOTE — Telephone Encounter (Signed)
Medication sent to pharmacy  

## 2019-03-07 NOTE — Telephone Encounter (Signed)
°  Patient called stating she needs all her medication refilled if we can please call her pharmacy.

## 2019-03-12 ENCOUNTER — Encounter: Payer: Self-pay | Admitting: Cardiovascular Disease

## 2019-03-12 NOTE — Telephone Encounter (Signed)
Error

## 2019-03-28 ENCOUNTER — Encounter: Payer: Self-pay | Admitting: Gynecology

## 2019-04-04 ENCOUNTER — Telehealth: Payer: Self-pay

## 2019-04-04 MED ORDER — TERCONAZOLE 0.4 % VA CREA: 1.0000 | TOPICAL_CREAM | Freq: Every day | VAGINAL | 0 refills | Status: DC

## 2019-04-04 NOTE — Telephone Encounter (Signed)
Patient saw you 02/05/19 with yeast infection and said it feels like it has never gone completely away. Feels itchy up inside vagina.  She asked if you will call her something in because she is having problems with her right leg and not sure she could get up on table for exam.

## 2019-04-04 NOTE — Telephone Encounter (Signed)
Recommend Terazol 7-day cream nightly x7 nights

## 2019-04-04 NOTE — Telephone Encounter (Signed)
Spoke with patient and informed her and reviewed directions. Rx sent.

## 2019-05-09 ENCOUNTER — Other Ambulatory Visit: Payer: Self-pay

## 2019-05-09 ENCOUNTER — Encounter: Payer: Self-pay | Admitting: Gynecology

## 2019-05-09 ENCOUNTER — Ambulatory Visit (INDEPENDENT_AMBULATORY_CARE_PROVIDER_SITE_OTHER): Payer: Medicare Other | Admitting: Gynecology

## 2019-05-09 ENCOUNTER — Telehealth: Payer: Self-pay | Admitting: *Deleted

## 2019-05-09 VITALS — BP 124/80

## 2019-05-09 DIAGNOSIS — R21 Rash and other nonspecific skin eruption: Secondary | ICD-10-CM | POA: Diagnosis not present

## 2019-05-09 MED ORDER — NYSTATIN-TRIAMCINOLONE 100000-0.1 UNIT/GM-% EX CREA: 1.0000 "application " | TOPICAL_CREAM | Freq: Two times a day (BID) | CUTANEOUS | 1 refills | Status: AC

## 2019-05-09 NOTE — Progress Notes (Signed)
    Jamie Ayers 1945-08-31 [id number]        73 y.o.  [id number] presents complaining of a skin rash on both breasts over the last several weeks.  History of same before.  Currently using OTC antifungal with some relief.  No masses or other abnormalities on self breast exam.  Mammography 09/2018 -.  Past medical history,surgical history, problem list, medications, allergies, family history and social history were all reviewed and documented in the EPIC chart.  Directed ROS with pertinent positives and negatives documented in the history of present illness/assessment and plan.  Exam: Jamie Ayers assistant Vitals:   05/09/19 1545  BP: 124/80   General appearance:  Normal Both breasts examined lying and sitting without masses retractions discharge adenopathy.  Faint rash noted superior aspect of both breasts.  Assessment/Plan:  73 y.o. [id number] with rash on both breasts suspect fungal given history.  No concerning changes.  Recommend Mycolog cream twice daily.  She will follow-up if her symptoms do not totally resolve.    Anastasio Auerbach MD, 4:03 PM 05/09/2019

## 2019-05-09 NOTE — Telephone Encounter (Signed)
Patient called c/o breast rash has used antifungal cream and no relief, patient also mention breast feel achy. I recommended patient schedule OV.  Patient called the breast center and was told to call our office.

## 2019-05-09 NOTE — Patient Instructions (Signed)
Apply the prescribed cream to both breasts twice daily.  Follow-up if your symptoms do not totally resolve.

## 2019-05-29 ENCOUNTER — Ambulatory Visit: Payer: Medicare Other | Admitting: Cardiovascular Disease

## 2019-06-11 ENCOUNTER — Other Ambulatory Visit: Payer: Self-pay | Admitting: Cardiovascular Disease

## 2019-06-11 MED ORDER — DIGOXIN 125 MCG PO TABS: 125.0000 ug | ORAL_TABLET | Freq: Every day | ORAL | 1 refills | Status: DC

## 2019-06-11 MED ORDER — FUROSEMIDE 40 MG PO TABS: 40.0000 mg | ORAL_TABLET | Freq: Every day | ORAL | 0 refills | Status: DC

## 2019-06-11 MED ORDER — FUROSEMIDE 40 MG PO TABS: 40.0000 mg | ORAL_TABLET | Freq: Every day | ORAL | 1 refills | Status: DC

## 2019-06-11 MED ORDER — ATORVASTATIN CALCIUM 20 MG PO TABS: 20.0000 mg | ORAL_TABLET | Freq: Every day | ORAL | 0 refills | Status: DC

## 2019-06-11 MED ORDER — ATORVASTATIN CALCIUM 20 MG PO TABS: 20.0000 mg | ORAL_TABLET | Freq: Every day | ORAL | 1 refills | Status: DC

## 2019-06-11 MED ORDER — ESOMEPRAZOLE MAGNESIUM 40 MG PO CPDR: 40.0000 mg | DELAYED_RELEASE_CAPSULE | Freq: Two times a day (BID) | ORAL | 0 refills | Status: DC

## 2019-06-11 MED ORDER — OMEGA-3-ACID ETHYL ESTERS 1 G PO CAPS: 2.0000 g | ORAL_CAPSULE | Freq: Two times a day (BID) | ORAL | 0 refills | Status: DC

## 2019-06-11 MED ORDER — DIGOXIN 125 MCG PO TABS: 125.0000 ug | ORAL_TABLET | Freq: Every day | ORAL | 0 refills | Status: DC

## 2019-06-11 MED ORDER — OMEGA-3-ACID ETHYL ESTERS 1 G PO CAPS: 2.0000 g | ORAL_CAPSULE | Freq: Two times a day (BID) | ORAL | 1 refills | Status: DC

## 2019-06-11 MED ORDER — ESOMEPRAZOLE MAGNESIUM 40 MG PO CPDR: 40.0000 mg | DELAYED_RELEASE_CAPSULE | Freq: Two times a day (BID) | ORAL | 1 refills | Status: DC

## 2019-06-11 NOTE — Telephone Encounter (Signed)
Rerouted to refills

## 2019-06-11 NOTE — Telephone Encounter (Signed)
Rx(s) sent to pharmacy electronically.  

## 2019-06-11 NOTE — Telephone Encounter (Signed)
*  STAT* If patient is at the pharmacy, call can be transferred to refill team.   1. Which medications need to be refilled? (please list name of each medication and dose if known)  atorvastatin (LIPITOR) 20 MG tablet digoxin (LANOXIN) 0.125 MG tablet esomeprazole (NEXIUM) 40 MG capsule furosemide (LASIX) 40 MG tablet omega-3 acid ethyl esters (LOVAZA) 1 g capsule  2. Which pharmacy/location (including street and city if local pharmacy) is medication to be sent to? [facility] - [place], [address]  3. Do they need a 30 day or 90 day supply? [place]

## 2019-07-10 ENCOUNTER — Ambulatory Visit (INDEPENDENT_AMBULATORY_CARE_PROVIDER_SITE_OTHER): Payer: Medicare Other | Admitting: Cardiovascular Disease

## 2019-07-10 ENCOUNTER — Encounter (INDEPENDENT_AMBULATORY_CARE_PROVIDER_SITE_OTHER): Payer: Self-pay

## 2019-07-10 ENCOUNTER — Other Ambulatory Visit: Payer: Self-pay

## 2019-07-10 DIAGNOSIS — E782 Mixed hyperlipidemia: Secondary | ICD-10-CM

## 2019-07-10 DIAGNOSIS — I428 Other cardiomyopathies: Secondary | ICD-10-CM | POA: Diagnosis not present

## 2019-07-10 DIAGNOSIS — U071 COVID-19: Secondary | ICD-10-CM

## 2019-07-10 DIAGNOSIS — E118 Type 2 diabetes mellitus with unspecified complications: Secondary | ICD-10-CM

## 2019-07-10 DIAGNOSIS — D141 Benign neoplasm of larynx: Secondary | ICD-10-CM

## 2019-07-10 DIAGNOSIS — E668 Other obesity: Secondary | ICD-10-CM

## 2019-07-10 DIAGNOSIS — I10 Essential (primary) hypertension: Secondary | ICD-10-CM

## 2019-07-10 DIAGNOSIS — Z9989 Dependence on other enabling machines and devices: Secondary | ICD-10-CM

## 2019-07-10 DIAGNOSIS — G4733 Obstructive sleep apnea (adult) (pediatric): Secondary | ICD-10-CM

## 2019-07-10 MED ORDER — CARVEDILOL PHOSPHATE ER 40 MG PO CP24: ORAL_CAPSULE | ORAL | 3 refills | Status: DC

## 2019-07-10 MED ORDER — FUROSEMIDE 40 MG PO TABS: 40.0000 mg | ORAL_TABLET | Freq: Every day | ORAL | 3 refills | Status: DC

## 2019-07-10 MED ORDER — ATORVASTATIN CALCIUM 20 MG PO TABS: 20.0000 mg | ORAL_TABLET | Freq: Every day | ORAL | 3 refills | Status: DC

## 2019-07-10 MED ORDER — VALSARTAN 160 MG PO TABS: 160.0000 mg | ORAL_TABLET | Freq: Two times a day (BID) | ORAL | 3 refills | Status: DC

## 2019-07-10 NOTE — Progress Notes (Signed)
Patient ID: Jamie Ayers, female   DOB: 02-May-1946, 74 y.o.   MRN: [id number]     Primary  M.D.: Dr. Kevan Ny  HPI: Jamie Ayers is a 74 y.o. female who presents to the office for an 8 month cardiology evaluation.  .  Ms. Vahle has a remote history of a nonischemic cardiomyopathy.  In December 2005 her ejection fraction was 20%. With aggressive medical therapy LV function normalized to approximately 55%. She has a history of obstructive sleep apnea and has been utilizing CPAP therapy since 2008. 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 She also has undergone papilloma from a breast duct removal at the time of the hysterectomy.  An echo Doppler study on 03/20/2013 showed an ejection fraction at 50-55% with mild concentric left ventricular hypertrophy. She had normal diastolic function. There was evidence for mild aortic insufficiency and mild mitral regurg patient with a centrally directed jet. Her left atrium was mildly dilated. She recently had repeat laboratory done by Dr. Kevan Ny.  Laboratory on 10/05/2013 revealed hemoglobin11.6/ hematocrit 36.0.  She did have microcytic indices at 73.5.  She denies any history of sickle cell trait or thalassemia.  She is unaware of blood loss.  Glucose was elevated at 147.  She tells me in the past Dr. Marlou Sa had tried metformin but she did not tolerate this.  TSH was 1.054.  She is continuing to use CPAP with 100% compliance.  She denies daytime fatigue or awareness of breakthrough snoring.   On 01/28/2015 a follow-up echo Doppler study revealed an ejection fraction at 60-65% without wall motion abnormalities.  There was grade 1 diastolic dysfunction, mild aortic and mild mitral regurgitation.  The left atrium was mildly dilated.  Recently, she has noticed some mild myalgias in her legs.    She was seen by Dr. Percival Spanish in 2017 with complaints of fatigue and  some shortness of breath.  BNP was normal at 12.7.  Blood work by Dr. Marlou Sa from 03/02/2017revealed  a TSH of 0.65.  She had microcytic indices with an MCV of 73, hemoglobin 12.4.  Her creatinine had risen to 1.58 with a BUN of 44.  Calcium was mildly elevated at 10.9.  Saw her in follow-up one month later.  She continues to use CPAP therapy.  I was able to review a download from 08/13/2015 through 09/11/2015.  She has S9.  AutoSet CPAP ResMed unit, 95th percentile pressure is 15.5.  AHI is excellent at 0.5.  She is compliant with 97% of usage stays averaging approximately 8 hours sleep per night.    Repeat blood work revealed a BUN of 47 and creatinine of 1.24.  Her calcium was mildly increased at 10.8.  Lipid studies were markedly abnormal.  Apparently, she had stopped taking atorvastatin the past 2 months.  Her total cholesterol had risen to 297, triglycerides 303, HDL 39, VLDL 61, and LDL 197.  Her hemoglobin is 11.5 with hematocrit 36.1.  She has had consistent microcytosis.  TSH remained normal at 1.19.    She was hospitalized at [facility] with pancreatitis.  She had developed significant vomiting and nausea, and on 06/17/2016 she was seen at urgent care and was found to have an amylase of 1120 and a lipase of 624.  Her Januvia was stopped as well, as Zetia.   When I saw her in F/U she denied any chest pain or  shortness of  breath.  She has noticed some difficulty with her legs and feet.  She notes cramping when she walks but at times note some muscle aches, not exertionally.  She also has noticed a vague nonexertional short-lived chest sensation which resolved spontaneously.  She does note some occasional ankle swelling.  She has been using CPAP with 100% compliance and can't sleep without it.    She received a new CPAP machine on June 13, 2017.  A download was obtained in the office today from February 10 - August 29, 2017.  She is meeting Medicare compliance standards with 90% of usage days.  Usage  greater than 4 hours was 26 days or 87%.  Days that she did not use her machine was when she had received a soap clean machine and not realize when she was out of town the reservoir requirement.  She is averaging 8 hours and 30 minutes of CPAP use per night.  She has been set on a auto CPAP minimum pressure of 8 and a maximum of 20.  AHI is 0.6.  There is no significant leak.  She is requiring high pressures with 95th percentile pressure at 18.2 with maximum average pressure at 19.2.  She is unaware of breakthrough snoring.  Her sleep is restorative.  She continues to experience times sleepiness and Epworth sleepiness scale score was endorsed  and calculated at 18.  She has difficulty with sleep initiation.  She often goes to bed at 11 PM but cannot fall asleep for several hours and at times is still awake at 2 AM.  She has a peripheral neuropathy.    When seen  in March 2019 she was having more fatigue and a download of her CPAP unit indicated that she was requiring very high pressures.  As result I changed her CPAP minimum pressure from 8 up to 11 cm of water pressure with a maximum of 20.  She had  called our office with complaints of increasing fatigue and shortness of breath with exertion.  She denies chest tightness fever chills or night sweats.  She was worked into my office on April 13, 2018 and admitted to illness in her feet, paresthesias from neuropathy and was having issues with back discomfort and lumbar disc disease.  She was having issues with spinal stenosis and had received a short course of prednisone therapy.  She admitted to exertional dyspnea and palpitations.  She was using CPAP with 100% compliance.   She underwent an echo Doppler study on April 27, 2018 which continued to show normal LV function with an EF of 60 to 65%.  There was grade 1 diastolic dysfunction and mild aortic regurgitation.  Lower extremity Doppler study with ABIs that were similar to her prior study 1 year  previously.  ABIs were within normal limits.  She has continued to use CPAP with 100% compliance.  A download was obtained in the office today from November 2 through May 21, 2018.  She has an air sense 10 AutoSet unit with a minimum pressure at 11 with maximum up to 20.  Her 95th percentile pressure is 18.7 with a maximum pressure of 19.4.  AHI is excellent at 0.6.    When I last saw her in December 2019 I reviewed her lower extremity Doppler assessment which was unchanged from previously and she had normal ABIs.  Her blood pressure was stable on Coreg SR 40 mg, furosemide 40 mg, valsartan 160 mg.  At that time I discussed the possibility  of discontinuing her digoxin.    She was last evaluated in a telemedicine visit in May 2020.  She has continued to use CPAP with 100% compliance since her last evaluation.  However, she does admit to difficulty in falling asleep.  Dr. Kevan Ny has given her a prescription for Ambien to take as needed.  At times it may take her 1 to 2 hours to fall asleep but she believes when she sleeps she is sleeping well.  A download from April 21 through Nov 08, 2018 showed average usage 7 hours and 59 minutes.  She was 100% compliant.    95th percentile pressure was 18.4 with a maximum average pressure of 19.5.  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  Since her last evaluation, she tells me that she developed Covid in November and was quarantined for 3 weeks.  Her symptoms included fevers chills fatigue and loss of appetite.  Presently, she denies any chest pain.  Her last echo Doppler study in November 2019 showed an EF of 60 to 65% with grade 1 diastolic dysfunction.  She continues to use CPAP with 100% compliance.  A download was obtained her 95th percentile CPAP pressure is 16.3 with a maximum average pressure of 17.8.  She denies any PND  orthopnea.  There is no chest pain.  She presents for reevaluation.   Past Medical History:  Diagnosis Date  . Anemia   . Anxiety disorder   . Asthmatic bronchitis   . Benign essential HTN 08/06/11   ECHO-EF>55%  . CHF (congestive heart failure) (HCC)    HeFPEF  . Diabetes mellitus without complication ([place])   . Disorder of vocal cord   . GERD (gastroesophageal reflux disease)   . Hernia   . Hyperlipidemia 03/14/12   Lexiscan myoview-WNL; unchanged from previous study.  . OSA on CPAP 03/28/07   sleep study-[facility]-AHI 9.42/HR and during REM sleep '@37' .50/hr. The average 02 sat duringREM AND NREM was 97.0%  . Thyroid disease   . Upper respiratory infection     Past Surgical History:  Procedure Laterality Date  . ABDOMINAL HYSTERECTOMY  2013   TAH BSO  . BREAST EXCISIONAL BIOPSY Left 2017   papilloma  . BREAST RECONSTRUCTION Bilateral    Patient had fat pads removed from both sides   . BREAST SURGERY     Papilloma  . DILATION AND CURETTAGE OF UTERUS    . ENDOMETRIAL BIOPSY    . HERNIA REPAIR    . HYSTEROSCOPY    . KNEE SURGERY     arthroscopic  . NISSEN FUNDOPLICATION    . OOPHORECTOMY     BSO  . Parathyroid surg    . Vocal Cord Surg      Allergies  Allergen Reactions  . Midazolam Other (See Comments)    REACTION: cardiac arrest  . Midazolam Hcl     Cardiac arrest    VERSED  . Codeine Hives and Itching  . Morphine And Related Hives  . Paba Derivatives Nausea Only    PT REPORTS SEVERE NAUSEA AFTER SURGERY, DENIES VOMITING  . Sulfonamide Derivatives Hives and Itching  . Sulfamethoxazole Hives    Current Outpatient Medications  Medication Sig Dispense Refill  . atorvastatin (LIPITOR) 20 MG tablet Take 1 tablet (20 mg total) by mouth daily. 90 tablet 3  . Calcium Carbonate-Vitamin D (CALCIUM +  D PO) Take 1 tablet by mouth daily.     . carvedilol (COREG CR) 40 MG 24 hr capsule TAKE 1 CAPSULE BY MOUTH EVERY DAY IN THE EVENING 90  capsule 3  . cycloSPORINE (RESTASIS) 0.05 % ophthalmic emulsion Place 1 drop into both eyes daily.    . Empagliflozin (JARDIANCE PO) Take by mouth.    . esomeprazole (NEXIUM) 40 MG capsule Take 1 capsule (40 mg total) by mouth 2 (two) times daily before a meal. 180 capsule 1  . furosemide (LASIX) 40 MG tablet Take 1 tablet (40 mg total) by mouth daily. 90 tablet 3  . metFORMIN (GLUCOPHAGE) 500 MG tablet Take by mouth 2 (two) times daily with a meal.    . montelukast (SINGULAIR) 10 MG tablet Take 10 mg by mouth at bedtime.    . Multiple Vitamin (MULTIVITAMIN) tablet Take 1 tablet by mouth daily.      . NON FORMULARY CPAP THERAPY    . NONFORMULARY OR COMPOUNDED ITEM Boric acid vaginal suppositories 600 mg one per vagina as needed for yeast 30 each 0  . nystatin-triamcinolone (MYCOLOG II) cream Apply 1 application topically 2 (two) times daily. 30 g 1  . omega-3 acid ethyl esters (LOVAZA) 1 g capsule Take 2 capsules (2 g total) by mouth 2 (two) times daily. 360 capsule 1  . Oxycodone HCl 10 MG TABS Take 1 tablet by mouth as needed.  0  . polyethylene glycol (MIRALAX / GLYCOLAX) packet Take 17 g by mouth daily.    . valsartan (DIOVAN) 160 MG tablet Take 1 tablet (160 mg total) by mouth 2 (two) times daily. 180 tablet 3  . zolpidem (AMBIEN) 10 MG tablet Take 1 tablet (10 mg total) by mouth at bedtime as needed. For sleep 30 tablet 3  . albuterol (PROVENTIL HFA;VENTOLIN HFA) 108 (90 BASE) MCG/ACT inhaler Inhale 2 puffs into the lungs every 4 (four) hours as needed for wheezing. 3.7 g 0   No current facility-administered medications for this visit.    Social History   Socioeconomic History  . Marital status: Married    Spouse name: Not on file  . Number of children: Not on file  . Years of education: Not on file  . Highest education level: Not on file  Occupational History  . Not on file  Tobacco Use  . Smoking status: Never Smoker  . Smokeless tobacco: Never Used  Substance and Sexual  Activity  . Alcohol use: No    Alcohol/week: 0.0 standard drinks  . Drug use: No  . Sexual activity: Yes    Birth control/protection: Post-menopausal, Surgical    Comment: HYST-1st intercourse 74 yo-Fewer than 5 partners,DES NEG  Other Topics Concern  . Not on file  Social History Narrative  . Not on file   Social Determinants of Health   Financial Resource Strain:   . Difficulty of Paying Living Expenses: Not on file  Food Insecurity:   . Worried About Charity fundraiser in the Last Year: Not on file  . Ran Out of Food in the Last Year: Not on file  Transportation Needs:   . Lack of Transportation (Medical): Not on file  . Lack of Transportation (Non-Medical): Not on file  Physical Activity:   . Days of Exercise per Week: Not on file  . Minutes of Exercise per Session: Not on file  Stress:   . Feeling of Stress : Not on file  Social Connections:   . Frequency of Communication with  Friends and Family: Not on file  . Frequency of Social Gatherings with Friends and Family: Not on file  . Attends Religious Services: Not on file  . Active Member of Clubs or Organizations: Not on file  . Attends Archivist Meetings: Not on file  . Marital Status: Not on file  Intimate Partner Violence:   . Fear of Current or Ex-Partner: Not on file  . Emotionally Abused: Not on file  . Physically Abused: Not on file  . Sexually Abused: Not on file    Family History  Problem Relation Age of Onset  . Diabetes Father   . Hypertension Father   . Cancer Father        Prostate cancer  . Arthritis Father   . Gout Father   . Hypertension Mother   . Stroke Mother   . Breast cancer Maternal Grandmother        Age 39's  . Ovarian cancer Paternal Grandmother   . Breast cancer Sister        Age 69  . Kidney disease Sister    Social history is notable in that she is married has 4 children 9 grandchildren. There is no tobacco or alcohol use.  ROS General: Negative; No fevers,  chills, or night sweats; positive for fatigue HEENT: Occasional hoarseness.  In the past she has had difficulty with vocal cord papillomas. No changes in vision or hearing, sinus congestion, difficulty swallowing Pulmonary: Negative; No cough, wheezing, shortness of breath, hemoptysis Cardiovascular: Negative; No chest pain, presyncope, syncope, palpitations GI: Negative; No nausea, vomiting, diarrhea, or abdominal pain GU: Negative; No dysuria, hematuria, or difficulty voiding Musculoskeletal: Positive for mild myalgias; spinal stenosis Hematologic/Oncology: Negative; no easy bruising, bleeding Endocrine: Positive for diabetes mellitus.  No cold or heat intolerance. Neuro: Negative; no changes in balance, headaches Skin: Negative; No rashes or skin lesions Psychiatric: Negative; No behavioral problems, depression Sleep: Positive for sleep apnea, for which she uses CPAP every night.  No residual snoring, daytime sleepiness, hypersomnolence, bruxism, restless legs, hypnogognic hallucinations, no cataplexy Other comprehensive 14 point system review is negative.   PE BP (!) 147/82   Pulse 62   Temp (!) 96.8 F (36 C)   Ht 5' 4.5" (1.638 m)   Wt 199 lb (90.3 kg)   SpO2 98%   BMI 33.63 kg/m    Repeat blood pressure was 122/68  Wt Readings from Last 3 Encounters:  07/10/19 199 lb (90.3 kg)  11/09/18 215 lb (97.5 kg)  08/02/18 220 lb (99.8 kg)   General: Alert, oriented, no distress.  Skin: normal turgor, no rashes, warm and dry HEENT: Normocephalic, atraumatic. Pupils equal round and reactive to light; sclera anicteric; extraocular muscles intact;  Nose without nasal septal hypertrophy Mouth/Parynx benign; Mallinpatti scale 3/4 Neck: No JVD, no carotid bruits; normal carotid upstroke Lungs: clear to ausculatation and percussion; no wheezing or rales Chest wall: without tenderness to palpitation Heart: PMI not displaced, RRR, s1 s2 normal, 1/6 systolic murmur, no diastolic murmur,  no rubs, gallops, thrills, or heaves Abdomen: soft, nontender; no hepatosplenomehaly, BS+; abdominal aorta nontender and not dilated by palpation. Back: no CVA tenderness Pulses 2+ Musculoskeletal: full range of motion, normal strength, no joint deformities Extremities: no clubbing cyanosis or edema, Homan's sign negative  Neurologic: grossly nonfocal; Cranial nerves grossly wnl Psychologic: Normal mood and affect   ECG (independently read by me): Normal sinus rhythm at 62 bpm.  Borderline first-degree block with a PR normal at 202 ms.  No ectopy.  Normal QTc interval at 381 ms  December 2019 ECG (independently read by me): Normal sinus rhythm at 66 bpm.  First-degree AV block with appeared normal to 10 ms.  LVH by voltage criteria.  No ectopy.  October 2019 ECG (independently read by me): Normal sinus rhythm at 70 bpm.  LVH by voltage criteria probable left atrial enlargement.  Non-specific ST changes.  September 2018 ECG (independently read by me): Normal sinus rhythm at 64 bpm.  First-degree AV block.  PR interval 218 ms.  LVH by voltage criteria in aVL.  January 2018 ECG (independently read by me): Sinus bradycardia with first-degree AV block.  LVH by voltage criteria.  No significant ST-T changes.  October 2017 ECG (independently read by me): Normal sinus rhythm at 61 bpm.  LVH by voltage.  Borderline first-degree AV block with PR interval 206 ms.  April 2017 ECG (independently read by me): Normal sinus rhythm at 64 bpm.  First-degree AV block with a PR interval of 218 ms.  ECG (independently read by me): Normal sinus rhythm at 68 bpm.  LVH by voltage in aVL.  July 2016 ECG (independently read by me): Normal sinus rhythm at 64 bpm..  Borderline LVH.  No cigarette ST changes.  August 2015 ECG (independently read by me): Normal sinus rhythm at 67 beats per minute.  Moderate voltage criteria for LVH.  No significant ST changes.  Borderline first degree AV block with a PR interval at 208  ms.  10/08/2013 ECG (independently read by me): Sinus rhythm at 59 beats per minute.  Borderline first-degree block with PR interval of 220 ms.  QTc interval normal.  Prior 04/17/2013 ECG: Normal sinus rhythm at 62 beats per minute. Borderline first degree AV block is no longer present with her PR interval now at 184 ms.  LABS: BMP Latest Ref Rng & Units 06/15/2016 04/05/2016 10/05/2013  Glucose 65 - 99 mg/dL 150(H) 139(H) 147(H)  BUN 8 - 27 mg/dL 29(H) 47(H) 17  Creatinine 0.57 - 1.00 mg/dL 1.33(H) 1.24(H) 0.95  BUN/Creat Ratio 12 - 28 22 - -  Sodium 134 - 144 mmol/L 137 140 141  Potassium 3.5 - 5.2 mmol/L 5.0 4.4 4.0  Chloride 96 - 106 mmol/L 103 102 102  CO2 18 - 29 mmol/L '21 28 29  ' Calcium 8.7 - 10.3 mg/dL 10.1 10.8(H) 9.6    Hepatic Function Latest Ref Rng & Units 06/15/2016 04/05/2016 10/05/2013  Total Protein 6.0 - 8.5 g/dL 7.3 7.7 6.9  Albumin 3.5 - 4.8 g/dL 4.4 4.4 4.1  AST 0 - 40 IU/L '17 15 14  ' ALT 0 - 32 IU/L '15 14 20  ' Alk Phosphatase 39 - 117 IU/L 83 87 86  Total Bilirubin 0.0 - 1.2 mg/dL 1.3(H) 0.7 0.8   CBC Latest Ref Rng & Units 06/15/2016 06/15/2016 04/05/2016  WBC 4.6 - 10.2 K/uL 20.2(A) 20.2(HH) 7.3  Hemoglobin 12.2 - 16.2 g/dL 11.4(A) 11.5 11.5(L)  Hematocrit 37.7 - 47.9 % 34.7(A) 34.1 36.1  Platelets 150 - 379 x10E3/uL 230 - 264   Lab Results  Component Value Date   MCV 72.8 (A) 06/15/2016   MCV 70 (L) 06/15/2016   MCV 72.3 (L) 04/05/2016   Lab Results  Component Value Date   TSH 1.19 04/05/2016   Lab Results  Component Value Date   HGBA1C 8.4 (H) 10/08/2013   Lipid Panel     Component Value Date/Time   CHOL 177 07/14/2016 1031   TRIG 186 (H) 07/14/2016 1031  HDL 36 (L) 07/14/2016 1031   CHOLHDL 4.9 07/14/2016 1031   VLDL 37 (H) 07/14/2016 1031   LDLCALC 104 (H) 07/14/2016 1031    RADIOLOGY: No results found.  IMPRESSION: 1. NICM (nonischemic cardiomyopathy) ([place]): resolved   2. Essential hypertension   3. OSA on CPAP   4. Mixed  hyperlipidemia   5. Type 2 diabetes mellitus with complication, without long-term current use of insulin ([place])   6. Papilloma of larynx   7. COVID-19 virus infection: November 2020   8. Moderate obesity     ASSESSMENT AND PLAN: Ms. Palo is a 74 year-old African-American female who has a history of morbid obesity and a history of a nonischemic cardiomyopathy with an ejection fraction of 20% initially documented in 2005 with subsequent normalization. Her nuclear perfusion study in September 2013 showed normal perfusion without scar or ischemia. An echo Doppler study in August 2016  showed normal systolic function with an EF of 60 to 60% and there was grade 1 diastolic dysfunction.  There was mild AR and mild MR.  Her left atrium was mildly dilated.   Her last echo Doppler study in November 2019 showed EF at 60 to 65% with grade 1 diastolic dysfunction and mild aortic insufficiency.  As recently, she has been on a medical regimen consisting of long-acting carvedilol at 40 mg daily, furosemide 40 mg, and she continues to be on digoxin 0.125 mg.  With her excellent LV function, I have recommended discontinuance of digoxin.  Her blood pressure today is controlled on recheck by me at 122/68.  With her recent Covid infection, I am recommending a follow-up echo Doppler study to make certain she has not developed any reduction in LV function following her illness in November.  She appears well compensated on exam and does not have any overt heart failure symptoms.  She continues to use CPAP with 100% compliance and her 95th percentile pressure is 16.3.  She is diabetic on Metformin and Jardiance and I discussed Jardiance would be beneficial particularly with reference to his potential CHF.  GERD is controlled with Nexium.  She continues to be on atorvastatin 20 mg for hyperlipidemia.  She has not had recent laboratory but will be seeing Dr. Marlou Sa in the near future for repeat blood work.  She is obese with a body  mass index of 33.63.  Weight loss and increased exercise was recommended.  She underwent her papilloma surgery in September and tolerated that well.  I will see her in 6 months for reevaluation   Time spent: 25 minutes Troy Sine, MD, [facility]  07/12/2019 6:04 PM

## 2019-07-10 NOTE — Patient Instructions (Addendum)
Medication Instructions:  STOP- Digoxin(Lanoxin)  *If you need a refill on your cardiac medications before your next appointment, please call your pharmacy*  Lab Work: None Ordered  Testing/Procedures: Your physician has requested that you have an echocardiogram. Echocardiography is a painless test that uses sound waves to create images of your heart. It provides your doctor with information about the size and shape of your heart and how well your heart's chambers and valves are working. This procedure takes approximately one hour. There are no restrictions for this procedure.  Follow-Up: At [facility], you and your health needs are our priority.  As part of our continuing mission to provide you with exceptional heart care, we have created designated Provider Care Teams.  These Care Teams include your primary Cardiologist (physician) and Advanced Practice Providers (APPs -  Physician Assistants and Nurse Practitioners) who all work together to provide you with the care you need, when you need it.  Your next appointment:   6 month(s)  The format for your next appointment:   In Person  Provider:   Shelva Majestic, MD

## 2019-07-12 ENCOUNTER — Encounter: Payer: Self-pay | Admitting: Cardiovascular Disease

## 2019-08-03 ENCOUNTER — Other Ambulatory Visit: Payer: Self-pay

## 2019-08-06 ENCOUNTER — Encounter: Payer: Self-pay | Admitting: Obstetrics and Gynecology

## 2019-08-06 ENCOUNTER — Ambulatory Visit (INDEPENDENT_AMBULATORY_CARE_PROVIDER_SITE_OTHER): Payer: Medicare Other | Admitting: Obstetrics and Gynecology

## 2019-08-06 ENCOUNTER — Telehealth: Payer: Self-pay | Admitting: *Deleted

## 2019-08-06 ENCOUNTER — Other Ambulatory Visit: Payer: Self-pay

## 2019-08-06 VITALS — BP 124/80 | Ht 64.0 in | Wt 202.0 lb

## 2019-08-06 DIAGNOSIS — Z01419 Encounter for gynecological examination (general) (routine) without abnormal findings: Secondary | ICD-10-CM | POA: Diagnosis not present

## 2019-08-06 DIAGNOSIS — Z780 Asymptomatic menopausal state: Secondary | ICD-10-CM

## 2019-08-06 DIAGNOSIS — N644 Mastodynia: Secondary | ICD-10-CM | POA: Diagnosis not present

## 2019-08-06 DIAGNOSIS — Z803 Family history of malignant neoplasm of breast: Secondary | ICD-10-CM

## 2019-08-06 DIAGNOSIS — N952 Postmenopausal atrophic vaginitis: Secondary | ICD-10-CM

## 2019-08-06 DIAGNOSIS — R350 Frequency of micturition: Secondary | ICD-10-CM | POA: Diagnosis not present

## 2019-08-06 NOTE — Telephone Encounter (Signed)
Patient scheduled on 08/07/19 @ 1:45pm, left message for patient to call.

## 2019-08-06 NOTE — Telephone Encounter (Signed)
-----   Message from Joseph Pierini, MD sent at 08/06/2019  3:04 PM EST ----- Hi, can we please order a diagnostic mammogram on the RIGHT for Ms. Jamie Ayers. She should have a regular mammogram coming up. Thanks.

## 2019-08-06 NOTE — Patient Instructions (Signed)
Please schedule a bone density scan when you are able as this is due (last was in 2015)

## 2019-08-06 NOTE — Progress Notes (Signed)
Jamie Ayers 1946/04/10 [id number]  SUBJECTIVE:  74 y.o. G4P4004 female for annual routine gynecologic exam. She has no gynecologic concerns. Chronically feels some discomfort and intermittent burning sensation in the right breast. No nipple discharge. Has had diagnostic mammograms in the past without findings.  Does not feel any lumps.  Sexually active with her husband and has some deep dyspareunia.  Noting some urinary frequency today.  Current Outpatient Medications  Medication Sig Dispense Refill  . atorvastatin (LIPITOR) 20 MG tablet Take 1 tablet (20 mg total) by mouth daily. 90 tablet 3  . Calcium Carbonate-Vitamin D (CALCIUM + D PO) Take 1 tablet by mouth daily.     . carvedilol (COREG CR) 40 MG 24 hr capsule TAKE 1 CAPSULE BY MOUTH EVERY DAY IN THE EVENING 90 capsule 3  . cycloSPORINE (RESTASIS) 0.05 % ophthalmic emulsion Place 1 drop into both eyes daily.    . Empagliflozin (JARDIANCE PO) Take by mouth.    . esomeprazole (NEXIUM) 40 MG capsule Take 1 capsule (40 mg total) by mouth 2 (two) times daily before a meal. 180 capsule 1  . furosemide (LASIX) 40 MG tablet Take 1 tablet (40 mg total) by mouth daily. 90 tablet 3  . metFORMIN (GLUCOPHAGE) 500 MG tablet Take by mouth 2 (two) times daily with a meal.    . montelukast (SINGULAIR) 10 MG tablet Take 10 mg by mouth at bedtime.    . Multiple Vitamin (MULTIVITAMIN) tablet Take 1 tablet by mouth daily.      . NON FORMULARY CPAP THERAPY    . NONFORMULARY OR COMPOUNDED ITEM Boric acid vaginal suppositories 600 mg one per vagina as needed for yeast 30 each 0  . omega-3 acid ethyl esters (LOVAZA) 1 g capsule Take 2 capsules (2 g total) by mouth 2 (two) times daily. 360 capsule 1  . Oxycodone HCl 10 MG TABS Take 1 tablet by mouth as needed.  0  . polyethylene glycol (MIRALAX / GLYCOLAX) packet Take 17 g by mouth daily.    . valsartan (DIOVAN) 160 MG tablet Take 1 tablet (160 mg total) by mouth 2 (two) times daily. 180 tablet 3  .  zolpidem (AMBIEN) 10 MG tablet Take 1 tablet (10 mg total) by mouth at bedtime as needed. For sleep 30 tablet 3  . albuterol (PROVENTIL HFA;VENTOLIN HFA) 108 (90 BASE) MCG/ACT inhaler Inhale 2 puffs into the lungs every 4 (four) hours as needed for wheezing. 3.7 g 0  . nystatin-triamcinolone (MYCOLOG II) cream Apply 1 application topically 2 (two) times daily. (Patient not taking: Reported on 08/06/2019) 30 g 1   No current facility-administered medications for this visit.   Allergies: Midazolam, Midazolam hcl, Codeine, Morphine and related, Paba derivatives, Sulfonamide derivatives, and Sulfamethoxazole  No LMP recorded. Patient is postmenopausal.  Past medical history,surgical history, problem list, medications, allergies, family history and social history were all reviewed and documented as reviewed in the EPIC chart.  ROS:  Feeling well. No dyspnea or chest pain on exertion.  No abdominal pain, change in bowel habits, black or bloody stools.  No urinary tract symptoms. GYN ROS:  no abnormal bleeding, pelvic pain or discharge, no breast pain or new or enlarging lumps on self exam. No neurological complaints.   OBJECTIVE:  BP 124/80   Ht 5\' 4"  (1.626 m)   Wt 202 lb (91.6 kg)   BMI 34.67 kg/m  The patient appears well, alert, oriented x 3, in no distress. ENT normal.  Neck supple. No cervical  or supraclavicular adenopathy or thyromegaly.  Lungs are clear, good air entry, no wheezes, rhonchi or rales. S1 and S2 normal, no murmurs, regular rate and rhythm.  Abdomen soft without tenderness, guarding, mass or organomegaly.  Neurological is normal, no focal findings.  BREAST EXAM: breasts appear normal, symmetrically no suspicious masses, no skin or nipple changes or axillary nodes  PELVIC EXAM: VULVA: normal appearing vulva with no masses, tenderness or lesions, atrophic changes, VAGINA: normal appearing vagina with normal color and discharge, no lesions, CERVIX: normal appearing cervix  without discharge or lesions, UTERUS: uterus is normal size, shape, consistency and nontender, ADNEXA: normal adnexa in size, nontender and no masses, RECTAL: not done  Urinalysis negative.  Chaperone: [name] present during the examination  ASSESSMENT:  74 y.o. [id number] here for annual gynecologic exam  PLAN:   1. Postmenopausal. Discussed that vulvovaginal atrophy can certainly lead to dyspareunia. Lubricant recommended. Estrogen cream would potentially be an option but she is concerned about risks of cancer. History of recurrent yeast infections in the past, no current concerns. Previous TAH BSO for leiomyoma. Urinary frequency, no sign of UTI on UA.   2. Sometimes a symptom of postmenopausal atrophy. 3. Pap smear 2016.  No history of abnormal Pap smears.  She is comfortable with no longer screening.   4. Mammogram 09/2018.  Normal breast exam today.  Does continue to have symptoms on the right breast so will check a diagnostic scan again.  She does have a strong family history of breast cancer and questionable ovarian cancer.  Had discussed and recommended genetic counseling previously.  Patient decided against this and did not want referral.  5. Colonoscopy 2014.  Recommended that she follow up at the recommended interval.   6. DEXA 09/2013 was normal.  DEXA recommended soon as can schedule. 7. Health maintenance.  No labs today as she normally has these completed with her primary care provider.   Return annually or sooner, prn.  Joseph Pierini MD  08/06/19

## 2019-08-07 ENCOUNTER — Inpatient Hospital Stay: Admission: RE | Admit: 2019-08-07 | Payer: Medicare Other | Source: Ambulatory Visit

## 2019-08-07 NOTE — Telephone Encounter (Signed)
Patient informed. 

## 2019-08-07 NOTE — Addendum Note (Signed)
Addended by: Nelva Nay on: 08/07/2019 09:57 AM   Modules accepted: Orders

## 2019-08-08 ENCOUNTER — Other Ambulatory Visit: Payer: Self-pay

## 2019-08-08 ENCOUNTER — Ambulatory Visit (HOSPITAL_COMMUNITY): Payer: Medicare Other | Attending: Cardiology

## 2019-08-08 DIAGNOSIS — I428 Other cardiomyopathies: Secondary | ICD-10-CM | POA: Insufficient documentation

## 2019-08-08 LAB — URINALYSIS, COMPLETE W/RFL CULTURE
Bacteria, UA: NONE SEEN /HPF
Bilirubin Urine: NEGATIVE
Hyaline Cast: NONE SEEN /LPF
Ketones, ur: NEGATIVE
Leukocyte Esterase: NEGATIVE
Nitrites, Initial: NEGATIVE
Specific Gravity, Urine: 1.024 (ref 1.001–1.03)
pH: 6 (ref 5.0–8.0)

## 2019-08-08 LAB — URINE CULTURE
MICRO NUMBER:: 10151989
Result:: NO GROWTH
SPECIMEN QUALITY:: ADEQUATE

## 2019-08-08 LAB — CULTURE INDICATED

## 2019-08-16 ENCOUNTER — Telehealth: Payer: Self-pay | Admitting: Cardiovascular Disease

## 2019-08-16 NOTE — Telephone Encounter (Signed)
Called patient, gave results regarding ECHO- she has surgery coming up on the 18th of March, would you like this follow up before the surgery- she would like to know what to do to help her EF to not worsen. Will route to MD and nurse to make aware.

## 2019-08-16 NOTE — Telephone Encounter (Signed)
Patient returning call for echo results. 

## 2019-08-20 ENCOUNTER — Other Ambulatory Visit: Payer: Self-pay

## 2019-08-21 ENCOUNTER — Ambulatory Visit (INDEPENDENT_AMBULATORY_CARE_PROVIDER_SITE_OTHER): Payer: Medicare Other

## 2019-08-21 ENCOUNTER — Other Ambulatory Visit: Payer: Self-pay | Admitting: Obstetrics and Gynecology

## 2019-08-21 DIAGNOSIS — Z780 Asymptomatic menopausal state: Secondary | ICD-10-CM | POA: Diagnosis not present

## 2019-08-21 NOTE — Telephone Encounter (Signed)
She will need to continue current medical therapy.  Arrange for follow-up office visit before the office visit prior to her upcoming surgery if possible

## 2019-08-22 NOTE — Telephone Encounter (Signed)
Spoke with pt regarding Dr.Kelly's advice of continuing her current medical therapy and scheduling a follow up office visit before her surgery on the 18th. Pt states she wants someone to review her echo results with her because she did not understand them. Pt states she would like to see a doctor regarding these results because she wants to know if she still needs to have the surgery. Told pt that unfortunately Dr.Kelly does not have any opening prior to her surgery, but that she can be scheduled to see a PA. Pt still weary of not seeing a MD. Notified pt that appt could be made with PA tomorrow and that Dr.Kelly is in office so if the PA needed to consult with him that this would be possible. Pt agreeable. Appt with Sande Rives PA made for 08/23/19 at 3:15 pm. Pt also states that Dr.Kelly took her off her Digoxin and she was wondering if she needs to be placed back on it because she was doing well on this medication. Told pt this could be reviewed in her visit tomorrow. No other questions at this time. Will route to both providers to make aware.

## 2019-08-22 NOTE — Progress Notes (Deleted)
Cardiology Office Note:    Date:  08/22/2019   ID:  Jamie Ayers, DOB 1946/02/06, MRN [id number]  PCP:  [name], MD  Cardiologist:  Shelva Majestic, MD  Electrophysiologist:  None   Referring MD: [name], MD   Chief Complaint: follow up to discuss Echo results  History of Present Illness:    Jamie Ayers is a 74 y.o. female with a history of non-ischemic cardiomyopathy with EF as low as 20% in 05/2004 but with improvement in EF with aggressive medical therapy, hypertension, hyperlipidemia, type 2 diabetes mellitus, obstructive sleep apnea on CPAP, GERD controlled on Nexium,  papilloma of vocal cords s/p multiple, and COVID infection in 04/2019,  who is followed by Dr. Claiborne Billings and presents today to discuss recent Echo.   Patient has a remote history of non-ischemic cardiomyopathy with EF as low as 20% in 05/2004. She was treated with aggressive medical therapy and LV function normalized to approximately 55%. She also has known sleep apnea and Dr. Claiborne Billings has been managing her CPAP. She unfortunately tested positive for COVID in 04/2019 and quarantined for 3 weeks. Her symptoms included fevers, chills, fatigue, and loss of appetite. She was last seen by Dr. Claiborne Billings on 07/10/2019 and she was doing well from a cardiac standpoint. Digoxin was discontinued due to normal LV function latest Echo in 2019. However, repeat Echo was ordered due to recent COVID infection. Echo showed LVEF of 40-45% with global hypokinesis, grade 2 diastolic dysfunction, moderate to severe mitral regurgitation, mildly elevated PASP of 35.3 mmHg, and ascending aorta aneurysm measuring 57mm. Prior Echo in 04/2018 showed LVEF of 60-65% with normal wall motion and no evidence of mitral regurgitation.   Patient presents today to discuss Echo results. ***  Non-Ischemic Cardiomyopathy with Newly Reduced EF - Long history of non-ischemic cardiomyopathy with EF as low as 20% in 2005. LVEF improved to 60-65% with aggressive  medical therapy. Patient had COVID-19 infection in 04/2019 so repeat Echo was ordered at last visit.  - Echo showed LVEF of 40-45% with global hypokinesis, grade 2 diastolic dysfunction, moderate to severe mitral regurgitation, mildly elevated PASP of 35.3 mmHg, and ascending aorta aneurysm measuring 5mm. Prior Echo from 04/2018 showed LVEF of 60-65% with normal wall motion and no mitral regurgitation. - *** - Continue Lasix 40mg  daily.  - Continue Valsartan 160mg  daily and Coreg CR 40mg  daily.  - Restart Digoxin. ***  Hypertension - *** - Continue Valsartan and Coreg as above.  Hyperlipidemia - ***  Type 2 Diabetes Mellitus - Currently on Jardiance and Metformin.  - Managed by PCP. ***  Obstructive Sleep Apnea on CPAP - Compliant with CPAP. - Managed by Dr. Claiborne Billings.   Pre-Op Evaluation  - ***  Past Medical History:  Diagnosis Date  . Anemia   . Anxiety disorder   . Asthmatic bronchitis   . Benign essential HTN 08/06/11   ECHO-EF>55%  . CHF (congestive heart failure) (HCC)    HeFPEF  . COVID-19   . Diabetes mellitus without complication ([place])   . Disorder of vocal cord   . GERD (gastroesophageal reflux disease)   . Hernia   . Hyperlipidemia 03/14/12   Lexiscan myoview-WNL; unchanged from previous study.  . OSA on CPAP 03/28/07   sleep study-[facility] 9.42/HR and during REM sleep @37 .50/hr. The average 02 sat duringREM AND NREM was 97.0%  . Thyroid disease   . Upper respiratory infection     Past Surgical History:  Procedure  Laterality Date  . ABDOMINAL HYSTERECTOMY  2013   TAH BSO  . BREAST EXCISIONAL BIOPSY Left 2017   papilloma  . BREAST RECONSTRUCTION Bilateral    Patient had fat pads removed from both sides   . BREAST SURGERY     Papilloma  . DILATION AND CURETTAGE OF UTERUS    . ENDOMETRIAL BIOPSY    . HERNIA REPAIR    . HYSTEROSCOPY    . KNEE SURGERY     arthroscopic  . NISSEN FUNDOPLICATION    . OOPHORECTOMY     BSO    . Parathyroid surg    . Vocal Cord Surg      Current Medications: No outpatient medications have been marked as taking for the 08/23/19 encounter (Appointment) with Darreld Mclean, PA-C.     Allergies:   Midazolam, Midazolam hcl, Codeine, Morphine and related, Paba derivatives, Sulfonamide derivatives, and Sulfamethoxazole   Social History   Socioeconomic History  . Marital status: Married    Spouse name: Not on file  . Number of children: Not on file  . Years of education: Not on file  . Highest education level: Not on file  Occupational History  . Not on file  Tobacco Use  . Smoking status: Never Smoker  . Smokeless tobacco: Never Used  Substance and Sexual Activity  . Alcohol use: No    Alcohol/week: 0.0 standard drinks  . Drug use: No  . Sexual activity: Yes    Birth control/protection: Post-menopausal, Surgical    Comment: HYST-1st intercourse 74 yo-Fewer than 5 partners,DES NEG  Other Topics Concern  . Not on file  Social History Narrative  . Not on file   Social Determinants of Health   Financial Resource Strain:   . Difficulty of Paying Living Expenses: Not on file  Food Insecurity:   . Worried About Charity fundraiser in the Last Year: Not on file  . Ran Out of Food in the Last Year: Not on file  Transportation Needs:   . Lack of Transportation (Medical): Not on file  . Lack of Transportation (Non-Medical): Not on file  Physical Activity:   . Days of Exercise per Week: Not on file  . Minutes of Exercise per Session: Not on file  Stress:   . Feeling of Stress : Not on file  Social Connections:   . Frequency of Communication with Friends and Family: Not on file  . Frequency of Social Gatherings with Friends and Family: Not on file  . Attends Religious Services: Not on file  . Active Member of Clubs or Organizations: Not on file  . Attends Archivist Meetings: Not on file  . Marital Status: Not on file     Family History: The patient's  ***family history includes Arthritis in her father; Breast cancer in her maternal grandmother and sister; Cancer in her father; Diabetes in her father; Gout in her father; Hypertension in her father and mother; Kidney disease in her sister; Ovarian cancer in her paternal grandmother; Stroke in her mother.  ROS:   Please see the history of present illness.    *** All other systems reviewed and are negative.  EKGs/Labs/Other Studies Reviewed:    The following studies were reviewed today:  Lexiscan Myoview in 01/24/2015:  The left ventricular ejection fraction is normal (55-65%).  Nuclear stress EF: 65%.  There was no ST segment deviation noted during stress.  This is a low risk study.   There was a small, mild mid  to apical anteroseptal perfusion defect that was partially reversible.  Wall motion and EF normal.  Possible infarction with peri-infarct ischemia versus attenuation.  Overall low risk study.  _______________  Echocardiogram 04/27/2018: Study Conclusions: - Left ventricle: The cavity size was normal. Wall thickness was  normal. Systolic function was normal. The estimated ejection  fraction was in the range of 60% to 65%. Wall motion was normal;  there were no regional wall motion abnormalities. Doppler  parameters are consistent with abnormal left ventricular  relaxation (grade 1 diastolic dysfunction).  - Aortic valve: There was mild regurgitation.  _______________  Echocardiogram 08/08/2019: Impressions: 1. Left ventricular ejection fraction, by estimation, is 40 to 45%. The  left ventricle has mildly decreased function. The left ventricle  demonstrates global hypokinesis. There is moderate concentric left  ventricular hypertrophy. Left ventricular  diastolic parameters are consistent with Grade II diastolic dysfunction  (pseudonormalization). Elevated left atrial pressure.  2. Right ventricular systolic function is normal. The right ventricular  size is  normal. There is mildly elevated pulmonary artery systolic  pressure. The estimated right ventricular systolic pressure is 58.0 mmHg.  3. Left atrial size was moderately dilated.  4. The mitral valve is normal in structure and function. Moderate to  severe mitral valve regurgitation. No evidence of mitral stenosis.  5. The aortic valve is normal in structure and function. Aortic valve  regurgitation is mild. No aortic stenosis is present.  6. Aneurysm of the ascending aorta, measuring 40 mm.  7. The inferior vena cava is normal in size with greater than 50%  respiratory variability, suggesting right atrial pressure of 3 mmHg.   EKG:  EKG not ordered today.  Recent Labs: No results found for requested labs within last 8760 hours.  Recent Lipid Panel    Component Value Date/Time   CHOL 177 07/14/2016 1031   TRIG 186 (H) 07/14/2016 1031   HDL 36 (L) 07/14/2016 1031   CHOLHDL 4.9 07/14/2016 1031   VLDL 37 (H) 07/14/2016 1031   LDLCALC 104 (H) 07/14/2016 1031    Physical Exam:    Vital Signs: There were no vitals taken for this visit.    Wt Readings from Last 3 Encounters:  08/06/19 202 lb (91.6 kg)  07/10/19 199 lb (90.3 kg)  11/09/18 215 lb (97.5 kg)     General: 74 y.o. female in no acute distress. HEENT: Normocephalic and atraumatic. Sclera clear. EOMs intact. Neck: Supple. No carotid bruits. No JVD. Heart: *** RRR. Distinct S1 and S2. No murmurs, gallops, or rubs. Radial and distal pedal pulses 2+ and equal bilaterally. Lungs: No increased work of breathing. Clear to ausculation bilaterally. No wheezes, rhonchi, or rales.  Abdomen: Soft, non-distended, and non-tender to palpation. Bowel sounds present in all 4 quadrants.  MSK: Normal strength and tone for age. *** Extremities: No lower extremity edema.    Skin: Warm and dry. Neuro: Alert and oriented x3. No focal deficits. Psych: Normal affect. Responds appropriately.   Assessment:    No diagnosis  found.  Plan:     Disposition: Follow up in ***   Medication Adjustments/Labs and Tests Ordered: Current medicines are reviewed at length with the patient today.  Concerns regarding medicines are outlined above.  No orders of the defined types were placed in this encounter.  No orders of the defined types were placed in this encounter.   There are no Patient Instructions on file for this visit.   Signed, Darreld Mclean, PA-C  08/22/2019 10:18 AM  [facility]

## 2019-08-23 ENCOUNTER — Ambulatory Visit: Payer: Medicare Other | Admitting: Student

## 2019-08-24 NOTE — Progress Notes (Signed)
Cardiology Office Note   Date:  08/27/2019   ID:  JAIMY KLIETHERMES, DOB 10/20/1945, MRN [id number]  PCP:  [name], MD  Cardiologist:  Shelva Majestic MD   Chief Complaint  Patient presents with  . Congestive Heart Failure      History of Present Illness: RENIE STELMACH is a 74 y.o. 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   She has a history of nonischemic cardiomyopathy.  In December 2005 her ejection fraction was 20%. With aggressive medical therapy LV function normalized to approximately 55%. She has a history of obstructive sleep apnea and has been utilizing CPAP therapy since 2008. 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 She has been followed regularly by Dr Claiborne Billings with repeat Echos in 2014, 2016, 2018 and 2019 all showing normal EF with grade 1 diastolic dysfunction and mild AI. Nuclear perfusion study in September 2013 was normal.  She was seen by Dr Claiborne Billings in January. Noted she had Covid 19 infection in November with fever, chills, fatigue and loss of appetite. She had no cardiac symptoms at that time and Dr Claiborne Billings recommended stopping digoxin. He ordered a repeat Echo which showed EF 40-45% with mild LV dysfunction, moderate LVH, mild AI and now moderate to severe MR which is new. She is seen today to review results and to comment on her operative risk.   Since her Covid infection she does note that her energy level is better but is not back to normal. She denies any increased SOB, edema, weight gain, palpitations, orthopnea, PND. Notes only chest pain under her right breast that is better with inhaler. BP at home 135/78. Is careful with salt intake. Uses CPAP daily. She is scheduled for repeat vocal cord procedure on March 18.     Past Medical History:  Diagnosis Date  . Anemia   . Anxiety disorder   . Asthmatic bronchitis   . Benign essential HTN 08/06/11   ECHO-EF>55%  . CHF (congestive heart failure) (HCC)    HeFPEF  . COVID-19   . Diabetes mellitus without complication ([place])   . Disorder of vocal cord   . GERD (gastroesophageal reflux disease)   . Hernia   . Hyperlipidemia 03/14/12   Lexiscan myoview-WNL; unchanged from previous study.  . OSA on CPAP 03/28/07   sleep study-[facility] 9.42/HR and during REM sleep @37 .50/hr. The average 02 sat duringREM AND NREM was 97.0%  . Thyroid disease   . Upper respiratory infection     Past Surgical History:  Procedure Laterality Date  . ABDOMINAL HYSTERECTOMY  2013   TAH BSO  . BREAST EXCISIONAL BIOPSY Left 2017   papilloma  . BREAST RECONSTRUCTION Bilateral    Patient had fat pads removed from both sides   . BREAST SURGERY     Papilloma  . DILATION AND CURETTAGE OF UTERUS    . ENDOMETRIAL BIOPSY    . HERNIA REPAIR    . HYSTEROSCOPY    . KNEE SURGERY     arthroscopic  . NISSEN FUNDOPLICATION    . OOPHORECTOMY     BSO  . Parathyroid surg    . Vocal Cord Surg       Current Outpatient Medications  Medication Sig Dispense Refill  . ascorbic acid (VITAMIN C) 500 MG tablet Take by mouth.    Marland Kitchen aspirin 81 MG EC  tablet aspirin 81 mg tablet,delayed release  Take 1 tablet every day by oral route.    Marland Kitchen atorvastatin (LIPITOR) 20 MG tablet Take 1 tablet (20 mg total) by mouth daily. 90 tablet 3  . budesonide (PULMICORT) 0.5 MG/2ML nebulizer solution Inhale into the lungs.    . Calcium Carbonate-Vitamin D (CALCIUM + D PO) Take 1 tablet by mouth daily.     . calcium-vitamin D (OSCAL WITH D) 500-200 MG-UNIT TABS tablet Take by mouth.    . carvedilol (COREG CR) 40 MG 24 hr capsule TAKE 1 CAPSULE BY MOUTH EVERY DAY IN THE EVENING 90 capsule 3  . cycloSPORINE (RESTASIS) 0.05 % ophthalmic emulsion Place 1 drop into both eyes daily.    . diclofenac sodium (VOLTAREN) 1 % GEL diclofenac 1 % topical gel  APPLY 2 GRAMS TOPICALLY TO AFFECTED AREA THREE TIMES DAILY AS NEEDED     . Empagliflozin (JARDIANCE PO) Take by mouth.    . esomeprazole (NEXIUM) 40 MG capsule Take 1 capsule (40 mg total) by mouth 2 (two) times daily before a meal. 180 capsule 1  . furosemide (LASIX) 40 MG tablet Take 1 tablet (40 mg total) by mouth daily. 90 tablet 3  . gabapentin (NEURONTIN) 300 MG capsule Take by mouth.    Marland Kitchen glipiZIDE (GLUCOTROL XL) 5 MG 24 hr tablet glipizide ER 5 mg tablet, extended release 24 hr  TAKE 1 TABLET BY MOUTH ONCE DAILY    . levofloxacin (LEVAQUIN) 750 MG tablet levofloxacin 750 mg tablet  TAKE 1 TABLET BY MOUTH ONCE DAILY    . loteprednol (LOTEMAX) 0.5 % ophthalmic suspension Place 1 drop into both eyes 3 times daily for 14 days.    . metFORMIN (GLUCOPHAGE) 500 MG tablet Take by mouth 2 (two) times daily with a meal.    . montelukast (SINGULAIR) 10 MG tablet Take 10 mg by mouth at bedtime.    . Multiple Vitamin (MULTIVITAMIN) tablet Take 1 tablet by mouth daily.      . Multiple Vitamins-Minerals (CENTRUM SILVER) tablet Take by mouth.    . nitrofurantoin (MACRODANTIN) 50 MG capsule Take by mouth.    . NON FORMULARY CPAP THERAPY    . NONFORMULARY OR COMPOUNDED ITEM Boric acid vaginal suppositories 600 mg one per vagina as needed for yeast 30 each 0  . nystatin-triamcinolone (MYCOLOG II) cream Apply 1 application topically 2 (two) times daily. 30 g 1  . omega-3 acid ethyl esters (LOVAZA) 1 g capsule Take 2 capsules (2 g total) by mouth 2 (two) times daily. 360 capsule 1  . Omega-3 Fatty Acids (FISH OIL) 1000 MG CAPS Take by mouth.    . Oxycodone HCl 10 MG TABS Take 1 tablet by mouth as needed.  0  . oxyCODONE-acetaminophen (PERCOCET/ROXICET) 5-325 MG tablet TAKE 1 TABLET BY MOUTH TWICE DAILY AS NEEDED FOR PAIN    . polyethylene glycol (MIRALAX / GLYCOLAX) packet Take 17 g by mouth daily.    . QUEtiapine (SEROQUEL) 25 MG tablet quetiapine 25 mg tablet  TAKE 1 TABLET BY MOUTH AT NIGHT AS DIRECTED    . zolpidem (AMBIEN) 10 MG tablet Take 1 tablet (10 mg total) by  mouth at bedtime as needed. For sleep 30 tablet 3  . albuterol (PROVENTIL HFA;VENTOLIN HFA) 108 (90 BASE) MCG/ACT inhaler Inhale 2 puffs into the lungs every 4 (four) hours as needed for wheezing. 3.7 g 0  . sacubitril-valsartan (ENTRESTO) 49-51 MG Take 1 tablet by mouth 2 (two) times daily. 60 tablet 0  .  spironolactone (ALDACTONE) 25 MG tablet Take 1/2 tablet ( 12.5 mg ) daily 30 tablet 0   No current facility-administered medications for this visit.    Allergies:   Midazolam, Midazolam hcl, Codeine, Morphine and related, Paba derivatives, Sulfonamide derivatives, and Sulfamethoxazole    Social History:  The patient  reports that she has never smoked. She has never used smokeless tobacco. She reports that she does not drink alcohol or use drugs.   Family History:  The patient's family history includes Arthritis in her father; Breast cancer in her maternal grandmother and sister; Cancer in her father; Diabetes in her father; Gout in her father; Hypertension in her father and mother; Kidney disease in her sister; Ovarian cancer in her paternal grandmother; Stroke in her mother.    ROS:  Please see the history of present illness.   Otherwise, review of systems are positive for none.   All other systems are reviewed and negative.    PHYSICAL EXAM: VS:  BP 140/78   Pulse 62   Temp (!) 90.1 F (32.3 C)   Resp 20   Ht 5\' 4"  (1.626 m)   Wt 201 lb (91.2 kg)   SpO2 97%   BMI 34.50 kg/m  , BMI Body mass index is 34.5 kg/m. GEN: Well nourished, overweight, in no acute distress  HEENT: normal  Neck: no JVD, carotid bruits, or masses Cardiac: RRR; no murmurs, rubs, or gallops,no edema  Respiratory:  clear to auscultation bilaterally, normal work of breathing GI: soft, nontender, nondistended, + BS MS: no deformity or atrophy  Skin: warm and dry, no rash Neuro:  Strength and sensation are intact Psych: euthymic mood, full affect   EKG:  EKG is not ordered today. The ekg ordered today  demonstrates none   Recent Labs: No results found for requested labs within last 8760 hours.    Lipid Panel    Component Value Date/Time   CHOL 177 07/14/2016 1031   TRIG 186 (H) 07/14/2016 1031   HDL 36 (L) 07/14/2016 1031   CHOLHDL 4.9 07/14/2016 1031   VLDL 37 (H) 07/14/2016 1031   LDLCALC 104 (H) 07/14/2016 1031    Dated 08/24/19: A1c 6.4%.   Wt Readings from Last 3 Encounters:  08/27/19 201 lb (91.2 kg)  08/06/19 202 lb (91.6 kg)  07/10/19 199 lb (90.3 kg)      Other studies Reviewed: Additional studies/ records that were reviewed today include:  Echo: 08/08/19: IMPRESSIONS    1. Left ventricular ejection fraction, by estimation, is 40 to 45%. The  left ventricle has mildly decreased function. The left ventricle  demonstrates global hypokinesis. There is moderate concentric left  ventricular hypertrophy. Left ventricular  diastolic parameters are consistent with Grade II diastolic dysfunction  (pseudonormalization). Elevated left atrial pressure.  2. Right ventricular systolic function is normal. The right ventricular  size is normal. There is mildly elevated pulmonary artery systolic  pressure. The estimated right ventricular systolic pressure is 09.9 mmHg.  3. Left atrial size was moderately dilated.  4. The mitral valve is normal in structure and function. Moderate to  severe mitral valve regurgitation. No evidence of mitral stenosis.  5. The aortic valve is normal in structure and function. Aortic valve  regurgitation is mild. No aortic stenosis is present.  6. Aneurysm of the ascending aorta, measuring 40 mm.  7. The inferior vena cava is normal in size with greater than 50%  respiratory variability, suggesting right atrial pressure of 3 mmHg.    ASSESSMENT  AND PLAN:  1. Nonischemic cardiomyopathy. Previously EF had normalized with medical therapy but most recent Echo shows reduction of EF to 40-45%. This possibly could be related to Covid 19  infection in November. Could be related to chronic DM. On chronic therapy with Coreg, Valsartan, and lasix. Digoxin discontinued in January. I doubt this is why her EF has changed.  Also on Jardiance. Volume status looks good today. I recommend switching valsartan to Entresto. Start at 49/51 mg bid. Add spironolactone 12.5 mg daily. Check BMET later this week. Patient states she had lab work with Dr Marlou Sa on Friday and we will request a copy. Plan follow up with Dr Claiborne Billings in 4 weeks. Repeat Echo in 3 months after medication optimization.   2. Mitral insufficiency. Echo reviewed. The MV looks structurally intact. I suspect MR is secondary to reduced LV function and LV dilation. Really no significant murmur on exam. Will follow up after optimization of CHF therapy  3. OSA on CPAP- compliant  4. Mixed HLD  5. Diabetes mellitus type 2. Last A1c 6.4%.   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   7. Covid 19 infection in November 2020.  8. Morbid obesity.   Current medicines are reviewed at length with the patient today.  The patient does not have concerns regarding medicines.  The following changes have been made:  See above  Labs/ tests ordered today include:   Orders Placed This Encounter  Procedures  . Basic metabolic panel     Disposition:   FU with Dr Claiborne Billings  in 4 weeks  Signed, Merick Kelleher [place], MD  08/27/2019 8:55 AM    [facility] [address] [place], [place], [address] Phone [phone], Fax [phone]

## 2019-08-24 NOTE — Telephone Encounter (Signed)
Per Dr.Kelly, attempt to reschedule office visit with APP prior to sx

## 2019-08-24 NOTE — Telephone Encounter (Signed)
Called and spoke with pt to rescheduled appt from 3/4. Pt stated that she did not have anything on her schedule for yesterday and that her appt was today. Notified pt that in our appt. notes she was scheduled for yesterday 3/4 at 3:15pm with Sande Rives. Pt unhappy with miscommunication, states that now she may not be able to have her surgery and that the only reason she agreed to see the APP yesterday was because Dr.kelly was in office and the APP could "go back and forth with him". Pt weary of seeing anyone but a cardiologist. Notified pt that our APPs work in close contact with our MD and that even though they may not be able to speak with Dr.Kelly in person that day, that they would still be able to discuss her case.  Pt worried about having to reschedule her surgery if a doctor did not go through her information before her pre-procedure on 3/12. Notified pt that we did not want her to have to postpone her schedule and that if she did see an APP that we do have a Doctor of the Day that if there were any concerns or questions they needed answered that they would be able to discuss with that doctor who is also a cardiologist. Pt seemed more agreeable with this knowledge.  Unable to schedule an OV with an APP for 08/27/19 so patient placed with DOD- [place] that day at 8:20am. Pt agreeable with no other questions at this time. Will route to MD's primary to make aware.

## 2019-08-27 ENCOUNTER — Ambulatory Visit: Payer: Medicare Other | Admitting: General Practice

## 2019-08-27 ENCOUNTER — Other Ambulatory Visit: Payer: Self-pay

## 2019-08-27 ENCOUNTER — Encounter: Payer: Self-pay | Admitting: Cardiology

## 2019-08-27 ENCOUNTER — Ambulatory Visit (INDEPENDENT_AMBULATORY_CARE_PROVIDER_SITE_OTHER): Payer: Medicare Other | Admitting: Cardiology

## 2019-08-27 VITALS — BP 140/78 | HR 62 | Temp 90.1°F | Resp 20 | Ht 64.0 in | Wt 201.0 lb

## 2019-08-27 DIAGNOSIS — I428 Other cardiomyopathies: Secondary | ICD-10-CM | POA: Diagnosis not present

## 2019-08-27 DIAGNOSIS — I10 Essential (primary) hypertension: Secondary | ICD-10-CM | POA: Diagnosis not present

## 2019-08-27 DIAGNOSIS — Z9989 Dependence on other enabling machines and devices: Secondary | ICD-10-CM

## 2019-08-27 DIAGNOSIS — I5023 Acute on chronic systolic (congestive) heart failure: Secondary | ICD-10-CM | POA: Diagnosis not present

## 2019-08-27 DIAGNOSIS — G4733 Obstructive sleep apnea (adult) (pediatric): Secondary | ICD-10-CM

## 2019-08-27 DIAGNOSIS — E782 Mixed hyperlipidemia: Secondary | ICD-10-CM

## 2019-08-27 MED ORDER — SPIRONOLACTONE 25 MG PO TABS: ORAL_TABLET | ORAL | 3 refills | Status: DC

## 2019-08-27 MED ORDER — ENTRESTO 49-51 MG PO TABS: 1.0000 | ORAL_TABLET | Freq: Two times a day (BID) | ORAL | 3 refills | Status: DC

## 2019-08-27 MED ORDER — ENTRESTO 49-51 MG PO TABS: 1.0000 | ORAL_TABLET | Freq: Two times a day (BID) | ORAL | 0 refills | Status: DC

## 2019-08-27 MED ORDER — SPIRONOLACTONE 25 MG PO TABS: ORAL_TABLET | ORAL | 0 refills | Status: DC

## 2019-08-27 NOTE — Patient Instructions (Signed)
Stop Valsartan  Start Entresto 49/51 mg twice a day  Add spironolactone 12.5 mg daily  You are cleared to proceed with surgery  We will check a blood test later this week  Follow up with Dr Claiborne Billings in 4 weeks.

## 2019-08-29 NOTE — Telephone Encounter (Signed)
acknowledged

## 2019-08-30 ENCOUNTER — Telehealth: Payer: Self-pay | Admitting: Cardiology

## 2019-08-30 NOTE — Telephone Encounter (Signed)
New Message  Patient is calling in to see if she needs to get blood work done again. States that her most recent lab results are in and doesn't know if the BMET Dr. Martinique ordered is needed. Please call and advise.

## 2019-08-30 NOTE — Telephone Encounter (Signed)
I spoke with patient. She reports results of lab work done on 3/5 are now available. These are in Care everywhere.  Patient is asking if she still needs BMP this week.  Upon chart review BMP was ordered at office visit on 3/8 when medication changes were made. I told patient she should still have BMP done this week in our office.

## 2019-09-01 LAB — BASIC METABOLIC PANEL
BUN/Creatinine Ratio: 30 — ABNORMAL HIGH (ref 12–28)
BUN: 29 mg/dL — ABNORMAL HIGH (ref 8–27)
CO2: 23 mmol/L (ref 20–29)
Calcium: 10.6 mg/dL — ABNORMAL HIGH (ref 8.7–10.3)
Chloride: 104 mmol/L (ref 96–106)
Creatinine, Ser: 0.98 mg/dL (ref 0.57–1.00)
GFR calc Af Amer: 66 mL/min/{1.73_m2} (ref >59)
GFR calc non Af Amer: 57 mL/min/{1.73_m2} — ABNORMAL LOW (ref >59)
Glucose: 137 mg/dL — ABNORMAL HIGH (ref 65–99)
Potassium: 4.6 mmol/L (ref 3.5–5.2)
Sodium: 143 mmol/L (ref 134–144)

## 2019-09-07 ENCOUNTER — Telehealth: Payer: Self-pay | Admitting: Cardiovascular Disease

## 2019-09-07 NOTE — Telephone Encounter (Signed)
Patient calling to see if Dr. Claiborne Billings can give her a letter for her insurance that approves her for the [facility] Exercise Program.

## 2019-09-07 NOTE — Telephone Encounter (Signed)
The patient called in wanting to know if there were any exercise programs offered by [facility] that she could sign up for. She was also wondering if she needed a letter from Dr. Claiborne Billings for her insurance so it would be paid for. She has been advised to call and see what they offer.  She has been advised that they list exercise programs on the [facility] web site but do due COVID they may all be virtual at this time.   She has been advised to call back if she needs any further help in finding a program suitable for her.

## 2019-09-11 ENCOUNTER — Other Ambulatory Visit: Payer: Self-pay

## 2019-09-11 ENCOUNTER — Ambulatory Visit (INDEPENDENT_AMBULATORY_CARE_PROVIDER_SITE_OTHER): Payer: Medicare Other | Admitting: Sports Medicine

## 2019-09-11 ENCOUNTER — Encounter: Payer: Self-pay | Admitting: Student

## 2019-09-11 ENCOUNTER — Ambulatory Visit (INDEPENDENT_AMBULATORY_CARE_PROVIDER_SITE_OTHER): Payer: Medicare Other

## 2019-09-11 ENCOUNTER — Encounter: Payer: Self-pay | Admitting: Sports Medicine

## 2019-09-11 VITALS — Temp 97.1°F

## 2019-09-11 DIAGNOSIS — M2142 Flat foot [pes planus] (acquired), left foot: Secondary | ICD-10-CM

## 2019-09-11 DIAGNOSIS — M2141 Flat foot [pes planus] (acquired), right foot: Secondary | ICD-10-CM

## 2019-09-11 DIAGNOSIS — M19079 Primary osteoarthritis, unspecified ankle and foot: Secondary | ICD-10-CM

## 2019-09-11 DIAGNOSIS — E119 Type 2 diabetes mellitus without complications: Secondary | ICD-10-CM | POA: Diagnosis not present

## 2019-09-11 DIAGNOSIS — M79671 Pain in right foot: Secondary | ICD-10-CM

## 2019-09-11 DIAGNOSIS — M19071 Primary osteoarthritis, right ankle and foot: Secondary | ICD-10-CM | POA: Diagnosis not present

## 2019-09-11 DIAGNOSIS — M2041 Other hammer toe(s) (acquired), right foot: Secondary | ICD-10-CM

## 2019-09-11 DIAGNOSIS — M2042 Other hammer toe(s) (acquired), left foot: Secondary | ICD-10-CM

## 2019-09-11 NOTE — Patient Instructions (Signed)
voltaren topical pain cream to right foot

## 2019-09-11 NOTE — Progress Notes (Addendum)
Subjective: Jamie Ayers is a 74 y.o. female patient with history of diabetes who presents to office today for diabetic foot exam and for pain over the last 1-2 years at the top of her Right foot; desires new diabetic shoes; referred by PCP Dr. Marlou Sa Patient states that the glucose reading this morning was not recorded ranges 118-20 mg/dl. Last A1c 6.4. Patient denies any new changes in medication or new problems. Patient denies any new cramping, numbness, burning or tingling in the legs on Gabapentin like before.   Review of Systems  All other systems reviewed and are negative.    Patient Active Problem List   Diagnosis Date Noted  . Exertional shortness of breath 01/03/2015  . Chest pain 01/03/2015  . Morbid obesity ([place]) 10/08/2013  . Microcytic anemia 10/08/2013  . OSA on CPAP 10/08/2013  . NICM (nonischemic cardiomyopathy) ([place]) 03/11/2013  . Hyperlipemia 07/10/2012  . Allergic rhinitis 07/10/2012  . Sleep apnea 07/10/2012  . 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 Upper respiratory infection   . Asthmatic bronchitis   . CHF (congestive heart failure) ([place])   . Benign essential HTN   . GERD (gastroesophageal reflux disease)   . Intraductal papilloma of breast 08/31/2011  . 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/2011  . Fibroid   . Endocervical polyp   . CONSTIPATION, CHRONIC 08/26/2010  . INTESTINAL GAS 08/26/2010  . FULL INCONTINENCE OF FECES 08/26/2010  . COLONIC POLYPS, ADENOMATOUS, HX OF 08/24/2010   Current Outpatient Medications on File Prior to Visit  Medication Sig Dispense Refill  . ascorbic acid (VITAMIN C) 500 MG tablet Take by mouth.    Marland Kitchen aspirin 81 MG EC tablet aspirin 81 mg tablet,delayed release  Take 1 tablet every day by oral route.    Marland Kitchen atorvastatin (LIPITOR) 20 MG tablet Take 1 tablet (20 mg total) by mouth daily. 90 tablet 3  . budesonide (PULMICORT) 0.5 MG/2ML nebulizer solution Inhale into the lungs.    . Calcium Carbonate-Vitamin D (CALCIUM +  D PO) Take 1 tablet by mouth daily.     . calcium-vitamin D (OSCAL WITH D) 500-200 MG-UNIT TABS tablet Take by mouth.    . carvedilol (COREG CR) 40 MG 24 hr capsule TAKE 1 CAPSULE BY MOUTH EVERY DAY IN THE EVENING 90 capsule 3  . cycloSPORINE (RESTASIS) 0.05 % ophthalmic emulsion Place 1 drop into both eyes daily.    . diclofenac sodium (VOLTAREN) 1 % GEL diclofenac 1 % topical gel  APPLY 2 GRAMS TOPICALLY TO AFFECTED AREA THREE TIMES DAILY AS NEEDED    . Empagliflozin (JARDIANCE PO) Take by mouth.    . esomeprazole (NEXIUM) 40 MG capsule Take 1 capsule (40 mg total) by mouth 2 (two) times daily before a meal. 180 capsule 1  . furosemide (LASIX) 40 MG tablet Take 1 tablet (40 mg total) by mouth daily. 90 tablet 3  . gabapentin (NEURONTIN) 300 MG capsule Take by mouth.    Marland Kitchen glipiZIDE (GLUCOTROL XL) 5 MG 24 hr tablet glipizide ER 5 mg tablet, extended release 24 hr  TAKE 1 TABLET BY MOUTH ONCE DAILY    . levofloxacin (LEVAQUIN) 750 MG tablet levofloxacin 750 mg tablet  TAKE 1 TABLET BY MOUTH ONCE DAILY    . metFORMIN (GLUCOPHAGE) 500 MG tablet Take by mouth 2 (two) times daily with a meal.    . montelukast (SINGULAIR) 10 MG tablet Take 10 mg by mouth at bedtime.    . Multiple Vitamin (MULTIVITAMIN) tablet Take 1 tablet by  mouth daily.      . Multiple Vitamins-Minerals (CENTRUM SILVER) tablet Take by mouth.    . nitrofurantoin (MACRODANTIN) 50 MG capsule Take by mouth.    . NON FORMULARY CPAP THERAPY    . NONFORMULARY OR COMPOUNDED ITEM Boric acid vaginal suppositories 600 mg one per vagina as needed for yeast 30 each 0  . nystatin-triamcinolone (MYCOLOG II) cream Apply 1 application topically 2 (two) times daily. 30 g 1  . omega-3 acid ethyl esters (LOVAZA) 1 g capsule Take 2 capsules (2 g total) by mouth 2 (two) times daily. 360 capsule 1  . Omega-3 Fatty Acids (FISH OIL) 1000 MG CAPS Take by mouth.    . Oxycodone HCl 10 MG TABS Take 1 tablet by mouth as needed.  0  . oxyCODONE-acetaminophen  (PERCOCET/ROXICET) 5-325 MG tablet TAKE 1 TABLET BY MOUTH TWICE DAILY AS NEEDED FOR PAIN    . polyethylene glycol (MIRALAX / GLYCOLAX) packet Take 17 g by mouth daily.    . QUEtiapine (SEROQUEL) 25 MG tablet quetiapine 25 mg tablet  TAKE 1 TABLET BY MOUTH AT NIGHT AS DIRECTED    . sacubitril-valsartan (ENTRESTO) 49-51 MG Take 1 tablet by mouth 2 (two) times daily. 20 tablet 0  . spironolactone (ALDACTONE) 25 MG tablet Take 1/2 tablet ( 12.5 mg ) daily 45 tablet 3  . zolpidem (AMBIEN) 10 MG tablet Take 1 tablet (10 mg total) by mouth at bedtime as needed. For sleep 30 tablet 3  . albuterol (PROVENTIL HFA;VENTOLIN HFA) 108 (90 BASE) MCG/ACT inhaler Inhale 2 puffs into the lungs every 4 (four) hours as needed for wheezing. 3.7 g 0   No current facility-administered medications on file prior to visit.   Allergies  Allergen Reactions  . Midazolam Other (See Comments)    REACTION: cardiac arrest  . Midazolam Hcl     Cardiac arrest    VERSED  . Codeine Hives and Itching  . Morphine And Related Hives  . Paba Derivatives Nausea Only    PT REPORTS SEVERE NAUSEA AFTER SURGERY, DENIES VOMITING  . Sulfonamide Derivatives Hives and Itching  . Sulfamethoxazole Hives    Recent Results (from the past 2160 hour(s))  Urinalysis,Complete w/RFL Culture     Status: Abnormal   Collection Time: 08/06/19  3:19 PM  Result Value Ref Range   Color, Urine YELLOW YELLOW   APPearance CLEAR CLEAR   Specific Gravity, Urine 1.024 1.001 - 1.03    Comment: Verified by repeat analysis. .    pH 6.0 5.0 - 8.0   Glucose, UA 3+ (A) NEGATIVE   Bilirubin Urine NEGATIVE NEGATIVE   Ketones, ur NEGATIVE NEGATIVE   Hgb urine dipstick TRACE (A) NEGATIVE   Protein, ur 2+ (A) NEGATIVE   Nitrites, Initial NEGATIVE NEGATIVE   Leukocyte Esterase NEGATIVE NEGATIVE   WBC, UA 0-5 0 - 5 /HPF   RBC / HPF 0-2 0 - 2 /HPF   Squamous Epithelial / LPF 0-5 < OR = 5 /HPF   Bacteria, UA NONE SEEN NONE SEEN /HPF   Hyaline Cast NONE  SEEN NONE SEEN /LPF   Urine-Other      Comment: URINE CULTURE PENDING   Note      Comment: This urine was analyzed for the presence of WBC,  RBC, bacteria, casts, and other formed elements.  Only those elements seen were reported. . .   Urine Culture     Status: None   Collection Time: 08/06/19  3:19 PM  Result Value Ref Range  MICRO NUMBER: [id number]    SPECIMEN QUALITY: Adequate    Sample Source URINE    STATUS: FINAL    Result: No Growth   REFLEXIVE URINE CULTURE     Status: None   Collection Time: 08/06/19  3:19 PM  Result Value Ref Range   REFLEXIVE URINE CULTURE      Comment: CULTURE INDICATED - RESULTS TO FOLLOW  Basic metabolic panel     Status: Abnormal   Collection Time: 08/31/19  2:00 PM  Result Value Ref Range   Glucose 137 (H) 65 - 99 mg/dL   BUN 29 (H) 8 - 27 mg/dL   Creatinine, Ser 0.98 0.57 - 1.00 mg/dL   GFR calc non Af Amer 57 (L) >59 mL/min/1.73   GFR calc Af Amer 66 >59 mL/min/1.73   BUN/Creatinine Ratio 30 (H) 12 - 28   Sodium 143 134 - 144 mmol/L   Potassium 4.6 3.5 - 5.2 mmol/L   Chloride 104 96 - 106 mmol/L   CO2 23 20 - 29 mmol/L   Calcium 10.6 (H) 8.7 - 10.3 mg/dL    Objective: General: Patient is awake, alert, and oriented x 3 and in no acute distress.  Integument: Skin is warm, dry and supple bilateral. Nails are short and thick, 1-5 bilateral. No signs of infection. Minimal corn to 5th toes bilateral. Remaining integument unremarkable.  Vasculature:  Dorsalis Pedis pulse 2/4 bilateral. Posterior Tibial pulse  1/4 bilateral.  Capillary fill time <3 sec 1-5 bilateral. Positive hair growth to the level of the digits. Temperature gradient within normal limits. No varicosities present bilateral. No edema present bilateral.   Neurology: The patient has intact sensation measured with a 5.07/10g Semmes Weinstein Monofilament at all pedal sites bilateral. Vibratory sensation diminished bilateral with tuning fork. No Babinski sign present bilateral.    Musculoskeletal:Mild bunion, Hammertoe and pes planus asymptomatic pedal deformities noted bilateral. Muscular strength 5/5 in all lower extremity muscular groups bilateral without pain on range of motion except right midfoot at area of arthritis . No tenderness with calf compression bilateral.  Xrays midtarsal breach supportive of pes planus and dorsal midfoot spurs supportive of arthritis.   Assessment and Plan: Problem List Items Addressed This Visit    None    Visit Diagnoses    Right foot pain    -  Primary   Relevant Orders   DG Foot Complete Right   Arthritis of foot       Comprehensive diabetic foot examination, type 2 DM, encounter for ([place])       Pes planus of both feet       Hammer toes of both feet         -Examined patient. -Re-Discussed and educated patient on diabetic foot care, especially with  regards to the vascular, neurological and musculoskeletal systems.  -Stressed the importance of good glycemic control and the detriment of not  controlling glucose levels in relation to the foot. -Xrays reviewed -Offered injection for right foot pain, patient refused -Recommend Voltraen topical to use for pain and inflammation -Recommend good supportive shoes and insoles -[employer] diabetic shoe order form was completed; office to contact primary care for approval / certification;  Office to arrange shoe fitting and dispensing. -Answered all patient questions -Patient to return for diabetic shoe measurements  -Patient advised to call the office if any problems or questions arise in the meantime. -Patient was still having foot pain and present to office on 6/24 for a surgical shoe which was  dispensed by RN.  Landis Martins, DPM

## 2019-09-17 ENCOUNTER — Telehealth: Payer: Self-pay | Admitting: Cardiovascular Disease

## 2019-09-17 NOTE — Telephone Encounter (Signed)
Patient would like the no-show fee from her appt with [facility] 08-23-19 waived. The patient states she did not want to see the PA

## 2019-09-25 ENCOUNTER — Ambulatory Visit
Admission: RE | Admit: 2019-09-25 | Discharge: 2019-09-25 | Disposition: A | Discharge status: Home / Self Care | Payer: Medicare Other | Source: Ambulatory Visit | Attending: Obstetrics and Gynecology | Admitting: Obstetrics and Gynecology

## 2019-09-25 ENCOUNTER — Ambulatory Visit: Payer: Medicare Other

## 2019-09-25 ENCOUNTER — Other Ambulatory Visit: Payer: Self-pay

## 2019-09-25 DIAGNOSIS — N644 Mastodynia: Secondary | ICD-10-CM

## 2019-09-25 DIAGNOSIS — Z803 Family history of malignant neoplasm of breast: Secondary | ICD-10-CM

## 2019-09-27 ENCOUNTER — Other Ambulatory Visit: Payer: Medicare Other | Admitting: Orthotics

## 2019-10-05 ENCOUNTER — Encounter: Payer: Self-pay | Admitting: Cardiovascular Disease

## 2019-10-05 ENCOUNTER — Ambulatory Visit (INDEPENDENT_AMBULATORY_CARE_PROVIDER_SITE_OTHER): Payer: Medicare Other | Admitting: Cardiovascular Disease

## 2019-10-05 ENCOUNTER — Other Ambulatory Visit: Payer: Self-pay

## 2019-10-05 DIAGNOSIS — I5042 Chronic combined systolic (congestive) and diastolic (congestive) heart failure: Secondary | ICD-10-CM | POA: Diagnosis not present

## 2019-10-05 DIAGNOSIS — E668 Other obesity: Secondary | ICD-10-CM

## 2019-10-05 DIAGNOSIS — I10 Essential (primary) hypertension: Secondary | ICD-10-CM

## 2019-10-05 DIAGNOSIS — G4733 Obstructive sleep apnea (adult) (pediatric): Secondary | ICD-10-CM | POA: Diagnosis not present

## 2019-10-05 DIAGNOSIS — U071 COVID-19: Secondary | ICD-10-CM

## 2019-10-05 DIAGNOSIS — I428 Other cardiomyopathies: Secondary | ICD-10-CM | POA: Diagnosis not present

## 2019-10-05 DIAGNOSIS — Z9989 Dependence on other enabling machines and devices: Secondary | ICD-10-CM

## 2019-10-05 DIAGNOSIS — E669 Obesity, unspecified: Secondary | ICD-10-CM

## 2019-10-05 DIAGNOSIS — E782 Mixed hyperlipidemia: Secondary | ICD-10-CM

## 2019-10-05 DIAGNOSIS — D141 Benign neoplasm of larynx: Secondary | ICD-10-CM

## 2019-10-05 MED ORDER — OMEGA-3-ACID ETHYL ESTERS 1 G PO CAPS: 2.0000 g | ORAL_CAPSULE | Freq: Two times a day (BID) | ORAL | 3 refills | Status: DC

## 2019-10-05 NOTE — Progress Notes (Signed)
Patient ID: ROCKELL FAULKS, female   DOB: March 27, 1946, 74 y.o.   MRN: [id number]     Primary  M.D.: Dr. Kevan Ny  HPI: LUCCA GREGGS is a 74 y.o. female who presents to the office for a 3 month cardiology evaluation.  .  Ms. Dam has a remote history of a nonischemic cardiomyopathy.  In December 2005 her ejection fraction was 20%. With aggressive medical therapy LV function normalized to approximately 55%. She has a history of obstructive sleep apnea and has been utilizing CPAP therapy since 2008. 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 She also has undergone papilloma from a breast duct removal at the time of the hysterectomy.  An echo Doppler study on 03/20/2013 showed an ejection fraction at 50-55% with mild concentric left ventricular hypertrophy. She had normal diastolic function. There was evidence for mild aortic insufficiency and mild mitral regurg patient with a centrally directed jet. Her left atrium was mildly dilated. She recently had repeat laboratory done by Dr. Kevan Ny.  Laboratory on 10/05/2013 revealed hemoglobin11.6/ hematocrit 36.0.  She did have microcytic indices at 73.5.  She denies any history of sickle cell trait or thalassemia.  She is unaware of blood loss.  Glucose was elevated at 147.  She tells me in the past Dr. Marlou Sa had tried metformin but she did not tolerate this.  TSH was 1.054.  She is continuing to use CPAP with 100% compliance.  She denies daytime fatigue or awareness of breakthrough snoring.   On 01/28/2015 a follow-up echo Doppler study revealed an ejection fraction at 60-65% without wall motion abnormalities.  There was grade 1 diastolic dysfunction, mild aortic and mild mitral regurgitation.  The left atrium was mildly dilated.  Recently, she has noticed some mild myalgias in her legs.    She was seen by Dr. Percival Spanish in 2017 with complaints of fatigue  and some shortness of breath.  BNP was normal at 12.7.  Blood work by Dr. Marlou Sa from 03/02/2017revealed  a TSH of 0.65.  She had microcytic indices with an MCV of 73, hemoglobin 12.4.  Her creatinine had risen to 1.58 with a BUN of 44.  Calcium was mildly elevated at 10.9.  Saw her in follow-up one month later.  She continues to use CPAP therapy.  I was able to review a download from 08/13/2015 through 09/11/2015.  She has S9.  AutoSet CPAP ResMed unit, 95th percentile pressure is 15.5.  AHI is excellent at 0.5.  She is compliant with 97% of usage stays averaging approximately 8 hours sleep per night.    Repeat blood work revealed a BUN of 47 and creatinine of 1.24.  Her calcium was mildly increased at 10.8.  Lipid studies were markedly abnormal.  Apparently, she had stopped taking atorvastatin the past 2 months.  Her total cholesterol had risen to 297, triglycerides 303, HDL 39, VLDL 61, and LDL 197.  Her hemoglobin is 11.5 with hematocrit 36.1.  She has had consistent microcytosis.  TSH remained normal at 1.19.    She was hospitalized at [facility] with pancreatitis.  She had developed significant vomiting and nausea, and on 06/17/2016 she was seen at urgent care and was found to have an amylase of 1120 and a lipase of 624.  Her Januvia was stopped as well, as Zetia.   When I saw her in F/U she denied any chest pain or  shortness  of breath.  She has noticed some difficulty with her legs and feet.  She notes cramping when she walks but at times note some muscle aches, not exertionally.  She also has noticed a vague nonexertional short-lived chest sensation which resolved spontaneously.  She does note some occasional ankle swelling.  She has been using CPAP with 100% compliance and can't sleep without it.    She received a new CPAP machine on June 13, 2017.  A download was obtained in the office today from February 10 - August 29, 2017.  She is meeting Medicare compliance standards with 90% of usage days.   Usage greater than 4 hours was 26 days or 87%.  Days that she did not use her machine was when she had received a soap clean machine and not realize when she was out of town the reservoir requirement.  She is averaging 8 hours and 30 minutes of CPAP use per night.  She has been set on a auto CPAP minimum pressure of 8 and a maximum of 20.  AHI is 0.6.  There is no significant leak.  She is requiring high pressures with 95th percentile pressure at 18.2 with maximum average pressure at 19.2.  She is unaware of breakthrough snoring.  Her sleep is restorative.  She continues to experience times sleepiness and Epworth sleepiness scale score was endorsed  and calculated at 18.  She has difficulty with sleep initiation.  She often goes to bed at 11 PM but cannot fall asleep for several hours and at times is still awake at 2 AM.  She has a peripheral neuropathy.    When seen  in March 2019 she was having more fatigue and a download of her CPAP unit indicated that she was requiring very high pressures.  As result I changed her CPAP minimum pressure from 8 up to 11 cm of water pressure with a maximum of 20.  She had  called our office with complaints of increasing fatigue and shortness of breath with exertion.  She denies chest tightness fever chills or night sweats.  She was worked into my office on April 13, 2018 and admitted to illness in her feet, paresthesias from neuropathy and was having issues with back discomfort and lumbar disc disease.  She was having issues with spinal stenosis and had received a short course of prednisone therapy.  She admitted to exertional dyspnea and palpitations.  She was using CPAP with 100% compliance.   She underwent an echo Doppler study on April 27, 2018 which continued to show normal LV function with an EF of 60 to 65%.  There was grade 1 diastolic dysfunction and mild aortic regurgitation.  Lower extremity Doppler study with ABIs that were similar to her prior study 1 year  previously.  ABIs were within normal limits.  She has continued to use CPAP with 100% compliance.  A download was obtained in the office today from November 2 through May 21, 2018.  She has an air sense 10 AutoSet unit with a minimum pressure at 11 with maximum up to 20.  Her 95th percentile pressure is 18.7 with a maximum pressure of 19.4.  AHI is excellent at 0.6.    When I last saw her in December 2019 I reviewed her lower extremity Doppler assessment which was unchanged from previously and she had normal ABIs.  Her blood pressure was stable on Coreg SR 40 mg, furosemide 40 mg, valsartan 160 mg.  At that time I discussed the  possibility of discontinuing her digoxin.    She was last evaluated in a telemedicine visit in May 2020.  She has continued to use CPAP with 100% compliance since her last evaluation.  However, she does admit to difficulty in falling asleep.  Dr. Kevan Ny has given her a prescription for Ambien to take as needed.  At times it may take her 1 to 2 hours to fall asleep but she believes when she sleeps she is sleeping well.  A download from April 21 through Nov 08, 2018 showed average usage 7 hours and 59 minutes.  She was 100% compliant.    95th percentile pressure was 18.4 with a maximum average pressure of 19.5.  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  I last saw her on July 10, 2019 and since her previous evaluation she had  developed Covid in November and was quarantined for 3 weeks.  Her symptoms included fevers chills fatigue and loss of appetite.  Presently, she denies any chest pain.  Her last echo Doppler study in November 2019 showed an EF of 60 to 65% with grade 1 diastolic dysfunction.  She continued to use CPAP with 100% compliance.  A download was obtained her 95th percentile CPAP pressure is 16.3 with a maximum average pressure of  17.8.  She denies any PND orthopnea.  There is no chest pain.   During her evaluation, I recommended she undergo a follow-up echo Doppler study particularly in follow-up of her Covid infection to make certain there was no reduction in LV function.  With her previously documented excellent systolic function, her digoxin was discontinued.  With her diabetes I also discussed Jardiance as a consideration for treatment.  She underwent an echo Doppler study on August 08, 2019.  EF was now reduced at 40 to 45%.  There was grade 2 diastolic dysfunction and abnormal tissue Doppler suggesting elevated left atrial pressure.  There was mild dilation of her ascending aorta at 40 mm.  She was felt to have moderate to severe mitral regurgitation and moderate left atrial dilation.  She was subsequently seen by Dr. Peter Martinique on August 27, 2019 when he was DOD and with her reduced LV function she was transitioned to [facility] 49/51 in place of valsartan and she was started on spironolactone 12.5 mg daily.  It was felt that her MR secondary to reduced LV function and LV dilation.  She has continued to use CPAP.  Her DME company is now [employer].  Download was obtained from March 17 through October 04, 2019 which showed 83% of usage days.  CPAP is set at a minimum pressure of 11 and maximum of 20 with her 95th percentile pressure at 17.9 and maximum average pressure at 19.1.  AHI is 1.0.  She is in need to undergo repeat procedure for her papilloma.  She presents for evaluation.   Past Medical History:  Diagnosis Date  . Anemia   . Anxiety disorder   . Asthmatic bronchitis   . Benign essential HTN 08/06/11   ECHO-EF>55%  . CHF (congestive heart failure) (HCC)    HeFPEF  . COVID-19   . Diabetes mellitus without complication ([place])   . Disorder of vocal cord   . GERD (gastroesophageal reflux disease)   . Hernia   . Hyperlipidemia 03/14/12   Lexiscan myoview-WNL; unchanged from previous study.  . OSA on  CPAP 03/28/07    sleep study-[facility]-AHI 9.42/HR and during REM sleep '@37' .50/hr. The average 02 sat duringREM AND NREM was 97.0%  . Thyroid disease   . Upper respiratory infection     Past Surgical History:  Procedure Laterality Date  . ABDOMINAL HYSTERECTOMY  2013   TAH BSO  . BREAST EXCISIONAL BIOPSY Left 2017   papilloma  . BREAST RECONSTRUCTION Bilateral    Patient had fat pads removed from both sides   . BREAST SURGERY     Papilloma  . DILATION AND CURETTAGE OF UTERUS    . ENDOMETRIAL BIOPSY    . HERNIA REPAIR    . HYSTEROSCOPY    . KNEE SURGERY     arthroscopic  . NISSEN FUNDOPLICATION    . OOPHORECTOMY     BSO  . Parathyroid surg    . Vocal Cord Surg      Allergies  Allergen Reactions  . Midazolam Other (See Comments)    REACTION: cardiac arrest  . Midazolam Hcl     Cardiac arrest    VERSED  . Codeine Hives and Itching  . Morphine And Related Hives  . Paba Derivatives Nausea Only    PT REPORTS SEVERE NAUSEA AFTER SURGERY, DENIES VOMITING  . Sulfonamide Derivatives Hives and Itching  . Sulfamethoxazole Hives    Current Outpatient Medications  Medication Sig Dispense Refill  . albuterol (PROVENTIL HFA;VENTOLIN HFA) 108 (90 BASE) MCG/ACT inhaler Inhale 2 puffs into the lungs every 4 (four) hours as needed for wheezing. 3.7 g 0  . ascorbic acid (VITAMIN C) 500 MG tablet Take by mouth.    Marland Kitchen aspirin 81 MG EC tablet aspirin 81 mg tablet,delayed release  Take 1 tablet every day by oral route.    Marland Kitchen atorvastatin (LIPITOR) 20 MG tablet Take 1 tablet (20 mg total) by mouth daily. 90 tablet 3  . budesonide (PULMICORT) 0.5 MG/2ML nebulizer solution Inhale into the lungs.    . Calcium Carbonate-Vitamin D (CALCIUM + D PO) Take 1 tablet by mouth daily.     . calcium-vitamin D (OSCAL WITH D) 500-200 MG-UNIT TABS tablet Take by mouth.    . carvedilol (COREG CR) 40 MG 24 hr capsule TAKE 1 CAPSULE BY MOUTH EVERY DAY IN THE EVENING 90 capsule 3  . cycloSPORINE  (RESTASIS) 0.05 % ophthalmic emulsion Place 1 drop into both eyes daily.    . diclofenac sodium (VOLTAREN) 1 % GEL diclofenac 1 % topical gel  APPLY 2 GRAMS TOPICALLY TO AFFECTED AREA THREE TIMES DAILY AS NEEDED    . Empagliflozin (JARDIANCE PO) Take by mouth.    . esomeprazole (NEXIUM) 40 MG capsule Take 1 capsule (40 mg total) by mouth 2 (two) times daily before a meal. 180 capsule 1  . furosemide (LASIX) 40 MG tablet Take 1 tablet (40 mg total) by mouth daily. 90 tablet 3  . gabapentin (NEURONTIN) 300 MG capsule Take by mouth.    Marland Kitchen glipiZIDE (GLUCOTROL XL) 5 MG 24 hr tablet glipizide ER 5 mg tablet, extended release 24 hr  TAKE 1 TABLET BY MOUTH ONCE DAILY    . levofloxacin (LEVAQUIN) 750 MG tablet levofloxacin 750 mg tablet  TAKE 1 TABLET BY MOUTH ONCE DAILY    . metFORMIN (GLUCOPHAGE) 500 MG tablet Take by mouth 2 (two) times daily with a meal.    . montelukast (SINGULAIR) 10 MG tablet Take 10 mg by mouth at bedtime.    . Multiple Vitamin (MULTIVITAMIN) tablet Take 1 tablet  by mouth daily.      . Multiple Vitamins-Minerals (CENTRUM SILVER) tablet Take by mouth.    . nitrofurantoin (MACRODANTIN) 50 MG capsule Take by mouth.    . NON FORMULARY CPAP THERAPY    . NONFORMULARY OR COMPOUNDED ITEM Boric acid vaginal suppositories 600 mg one per vagina as needed for yeast 30 each 0  . nystatin-triamcinolone (MYCOLOG II) cream Apply 1 application topically 2 (two) times daily. 30 g 1  . omega-3 acid ethyl esters (LOVAZA) 1 g capsule Take 2 capsules (2 g total) by mouth 2 (two) times daily. 360 capsule 3  . Omega-3 Fatty Acids (FISH OIL) 1000 MG CAPS Take by mouth.    . Oxycodone HCl 10 MG TABS Take 1 tablet by mouth as needed.  0  . oxyCODONE-acetaminophen (PERCOCET/ROXICET) 5-325 MG tablet TAKE 1 TABLET BY MOUTH TWICE DAILY AS NEEDED FOR PAIN    . polyethylene glycol (MIRALAX / GLYCOLAX) packet Take 17 g by mouth daily.    . QUEtiapine (SEROQUEL) 25 MG tablet quetiapine 25 mg tablet  TAKE 1  TABLET BY MOUTH AT NIGHT AS DIRECTED    . sacubitril-valsartan (ENTRESTO) 49-51 MG Take 1 tablet by mouth 2 (two) times daily. 20 tablet 0  . spironolactone (ALDACTONE) 25 MG tablet Take 1/2 tablet ( 12.5 mg ) daily 45 tablet 3  . zolpidem (AMBIEN) 10 MG tablet Take 1 tablet (10 mg total) by mouth at bedtime as needed. For sleep 30 tablet 3   No current facility-administered medications for this visit.    Social History   Socioeconomic History  . Marital status: Married    Spouse name: Not on file  . Number of children: Not on file  . Years of education: Not on file  . Highest education level: Not on file  Occupational History  . Not on file  Tobacco Use  . Smoking status: Never Smoker  . Smokeless tobacco: Never Used  Substance and Sexual Activity  . Alcohol use: No    Alcohol/week: 0.0 standard drinks  . Drug use: No  . Sexual activity: Yes    Birth control/protection: Post-menopausal, Surgical    Comment: HYST-1st intercourse 74 yo-Fewer than 5 partners,DES NEG  Other Topics Concern  . Not on file  Social History Narrative  . Not on file   Social Determinants of Health   Financial Resource Strain:   . Difficulty of Paying Living Expenses:   Food Insecurity:   . Worried About Charity fundraiser in the Last Year:   . Arboriculturist in the Last Year:   Transportation Needs:   . Film/video editor (Medical):   Marland Kitchen Lack of Transportation (Non-Medical):   Physical Activity:   . Days of Exercise per Week:   . Minutes of Exercise per Session:   Stress:   . Feeling of Stress :   Social Connections:   . Frequency of Communication with Friends and Family:   . Frequency of Social Gatherings with Friends and Family:   . Attends Religious Services:   . Active Member of Clubs or Organizations:   . Attends Archivist Meetings:   Marland Kitchen Marital Status:   Intimate Partner Violence:   . Fear of Current or Ex-Partner:   . Emotionally Abused:   Marland Kitchen Physically Abused:     . Sexually Abused:     Family History  Problem Relation Age of Onset  . Diabetes Father   . Hypertension Father   . Cancer Father  Prostate cancer  . Arthritis Father   . Gout Father   . Hypertension Mother   . Stroke Mother   . Breast cancer Maternal Grandmother        Age 49's  . Ovarian cancer Paternal Grandmother   . Breast cancer Sister        Age 44  . Kidney disease Sister    Social history is notable in that she is married has 4 children 9 grandchildren. There is no tobacco or alcohol use.  ROS General: Negative; No fevers, chills, or night sweats; positive for fatigue HEENT: Occasional hoarseness.  In the past she has had difficulty with vocal cord papillomas. No changes in vision or hearing, sinus congestion; papillomas on her vocal cord Pulmonary: Negative; No cough, wheezing, shortness of breath, hemoptysis Cardiovascular: See HPI GI: Negative; No nausea, vomiting, diarrhea, or abdominal pain GU: Negative; No dysuria, hematuria, or difficulty voiding Musculoskeletal: Positive for mild myalgias; spinal stenosis Hematologic/Oncology: Negative; no easy bruising, bleeding Endocrine: Positive for diabetes mellitus.  No cold or heat intolerance. Neuro: Negative; no changes in balance, headaches Skin: Negative; No rashes or skin lesions Psychiatric: Negative; No behavioral problems, depression Sleep: Positive for sleep apnea; she continues to be on CPAP.  No residual snoring, daytime sleepiness, hypersomnolence, bruxism, restless legs, hypnogognic hallucinations, no cataplexy Other comprehensive 14 point system review is negative.   PE BP (!) 130/58   Pulse 72   Ht '5\' 5"'  (1.651 m)   Wt 203 lb 3.2 oz (92.2 kg)   BMI 33.81 kg/m    Repeat blood pressure by me was 108/62 sleep on 120/64 standing  Wt Readings from Last 3 Encounters:  10/05/19 203 lb 3.2 oz (92.2 kg)  08/27/19 201 lb (91.2 kg)  08/06/19 202 lb (91.6 kg)   General: Alert, oriented, no  distress.  Skin: normal turgor, no rashes, warm and dry HEENT: Normocephalic, atraumatic. Pupils equal round and reactive to light; sclera anicteric; extraocular muscles intact;  Nose without nasal septal hypertrophy Mouth/Parynx benign; Mallinpatti scale 3/4 Neck: No JVD, no carotid bruits; normal carotid upstroke Lungs: clear to ausculatation and percussion; no wheezing or rales Chest wall: without tenderness to palpitation Heart: PMI not displaced, RRR, s1 s2 normal, 1/6 systolic murmur, no significant murmur on exam; no diastolic murmur, no rubs, gallops, thrills, or heaves Abdomen: soft, nontender; no hepatosplenomehaly, BS+; abdominal aorta nontender and not dilated by palpation. Back: no CVA tenderness Pulses 2+ Musculoskeletal: full range of motion, normal strength, no joint deformities Extremities: no clubbing cyanosis or edema, Homan's sign negative  Neurologic: grossly nonfocal; Cranial nerves grossly wnl Psychologic: Normal mood and affect   ECG (independently read by me): Sinus rhythm at 72 bpm, LVH by voltage criteria in aVL.  No ectopy.  Normal intervals.  January 2021 ECG (independently read by me): Normal sinus rhythm at 62 bpm.  Borderline first-degree block with a PR normal at 202 ms.  No ectopy.  Normal QTc interval at 381 ms  December 2019 ECG (independently read by me): Normal sinus rhythm at 66 bpm.  First-degree AV block with appeared normal to 10 ms.  LVH by voltage criteria.  No ectopy.  October 2019 ECG (independently read by me): Normal sinus rhythm at 70 bpm.  LVH by voltage criteria probable left atrial enlargement.  Non-specific ST changes.  September 2018 ECG (independently read by me): Normal sinus rhythm at 64 bpm.  First-degree AV block.  PR interval 218 ms.  LVH by voltage criteria in aVL.  January 2018 ECG (independently read by me): Sinus bradycardia with first-degree AV block.  LVH by voltage criteria.  No significant ST-T changes.  October 2017  ECG (independently read by me): Normal sinus rhythm at 61 bpm.  LVH by voltage.  Borderline first-degree AV block with PR interval 206 ms.  April 2017 ECG (independently read by me): Normal sinus rhythm at 64 bpm.  First-degree AV block with a PR interval of 218 ms.  ECG (independently read by me): Normal sinus rhythm at 68 bpm.  LVH by voltage in aVL.  July 2016 ECG (independently read by me): Normal sinus rhythm at 64 bpm..  Borderline LVH.  No cigarette ST changes.  August 2015 ECG (independently read by me): Normal sinus rhythm at 67 beats per minute.  Moderate voltage criteria for LVH.  No significant ST changes.  Borderline first degree AV block with a PR interval at 208 ms.  10/08/2013 ECG (independently read by me): Sinus rhythm at 59 beats per minute.  Borderline first-degree block with PR interval of 220 ms.  QTc interval normal.  Prior 04/17/2013 ECG: Normal sinus rhythm at 62 beats per minute. Borderline first degree AV block is no longer present with her PR interval now at 184 ms.  LABS: BMP Latest Ref Rng & Units 08/31/2019 06/15/2016 04/05/2016  Glucose 65 - 99 mg/dL 137(H) 150(H) 139(H)  BUN 8 - 27 mg/dL 29(H) 29(H) 47(H)  Creatinine 0.57 - 1.00 mg/dL 0.98 1.33(H) 1.24(H)  BUN/Creat Ratio 12 - 28 30(H) 22 -  Sodium 134 - 144 mmol/L 143 137 140  Potassium 3.5 - 5.2 mmol/L 4.6 5.0 4.4  Chloride 96 - 106 mmol/L 104 103 102  CO2 20 - 29 mmol/L '23 21 28  ' Calcium 8.7 - 10.3 mg/dL 10.6(H) 10.1 10.8(H)    Hepatic Function Latest Ref Rng & Units 06/15/2016 04/05/2016 10/05/2013  Total Protein 6.0 - 8.5 g/dL 7.3 7.7 6.9  Albumin 3.5 - 4.8 g/dL 4.4 4.4 4.1  AST 0 - 40 IU/L '17 15 14  ' ALT 0 - 32 IU/L '15 14 20  ' Alk Phosphatase 39 - 117 IU/L 83 87 86  Total Bilirubin 0.0 - 1.2 mg/dL 1.3(H) 0.7 0.8   CBC Latest Ref Rng & Units 06/15/2016 06/15/2016 04/05/2016  WBC 4.6 - 10.2 K/uL 20.2(A) 20.2(HH) 7.3  Hemoglobin 12.2 - 16.2 g/dL 11.4(A) 11.5 11.5(L)  Hematocrit 37.7 - 47.9 %  34.7(A) 34.1 36.1  Platelets 150 - 379 x10E3/uL 230 - 264   Lab Results  Component Value Date   MCV 72.8 (A) 06/15/2016   MCV 70 (L) 06/15/2016   MCV 72.3 (L) 04/05/2016   Lab Results  Component Value Date   TSH 1.19 04/05/2016   Lab Results  Component Value Date   HGBA1C 8.4 (H) 10/08/2013   Lipid Panel     Component Value Date/Time   CHOL 177 07/14/2016 1031   TRIG 186 (H) 07/14/2016 1031   HDL 36 (L) 07/14/2016 1031   CHOLHDL 4.9 07/14/2016 1031   VLDL 37 (H) 07/14/2016 1031   LDLCALC 104 (H) 07/14/2016 1031    RADIOLOGY: No results found.  IMPRESSION: 1. Chronic combined systolic and diastolic CHF, NYHA class 2 ([place])   2. NICM (nonischemic cardiomyopathy) ([place])   3. Essential hypertension   4. OSA on CPAP   5. Mixed hyperlipidemia   6. Papilloma of larynx   7. Moderate obesity   8. COVID-19 virus infection: November 2020     ASSESSMENT AND PLAN: Ms. Cancelliere is  a 74 year-old African-American female who has a history of morbid obesity and a history of a nonischemic cardiomyopathy with an ejection fraction of 20% initially documented in 2005 with subsequent normalization. Her nuclear perfusion study in September 2013 showed normal perfusion without scar or ischemia. An echo Doppler study in August 2016  showed normal systolic function with an EF of 60 to 60% and there was grade 1 diastolic dysfunction.  There was mild AR and mild MR.  Her left atrium was mildly dilated.   Her echo Doppler study in November 2019 showed EF at 60 to 65% with grade 1 diastolic dysfunction and mild aortic insufficiency.  As recently, she has been on a medical regimen consisting of long-acting carvedilol at 40 mg daily, furosemide 40 mg, and  digoxin 0.125 mg.  With her excellent LV function, I have recommended discontinuance of digoxin in January 2021.  With her recent Covid injection I recommended a follow-up echo Doppler study which now has demonstrated an EF reduced at 40 to 45% and there  was evidence for moderately severe mitral regurgitation and grade 2 diastolic dysfunction.  Her ARB therapy has been transitioned to Entresto 49/51 mg twice a day and spironolactone was also added to her regimen.  Her blood pressure today is too low at present to further titrate Trileptal next visit on 7/3 mg twice daily.  She continues to be on furosemide.  She is euvolemic on exam.  I do not appreciate moderately severe MR on physical exam although a mild murmur is present.  She continues to be on long-acting carvedilol in addition to furosemide.  She is diabetic on glipizide and Metformin.  She may benefit from subsequent initiation of Jardiance but with her low blood pressure today I will not institute this presently.  I have given her clearance to undergo her planned procedure.  I reviewed her download.  AHI on current pressures is excellent at 1.0.  She continues to tolerate CPAP well.  She continues to be on atorvastatin 20 mg and Lovaza for her mixed hyperlipidemia. I will see her in 3 months for reevaluation.   Troy Sine, MD, [facility]  10/06/2019 4:26 PM

## 2019-10-05 NOTE — Patient Instructions (Signed)
Medication Instructions:  CONTINUE WITH CURRENT MEDICATIONS. NO CHANGES.  *If you need a refill on your cardiac medications before your next appointment, please call your pharmacy*   Follow-Up: At [facility], you and your health needs are our priority.  As part of our continuing mission to provide you with exceptional heart care, we have created designated Provider Care Teams.  These Care Teams include your primary Cardiologist (physician) and Advanced Practice Providers (APPs -  Physician Assistants and Nurse Practitioners) who all work together to provide you with the care you need, when you need it.  We recommend signing up for the patient portal called "MyChart".  Sign up information is provided on this After Visit Summary.  MyChart is used to connect with patients for Virtual Visits (Telemedicine).  Patients are able to view lab/test results, encounter notes, upcoming appointments, etc.  Non-urgent messages can be sent to your provider as well.   To learn more about what you can do with MyChart, go to [email or web address].    Your next appointment:   3 month(s)  The format for your next appointment:   In Person  Provider:   Shelva Majestic, MD

## 2019-10-06 ENCOUNTER — Encounter: Payer: Self-pay | Admitting: Cardiovascular Disease

## 2019-10-09 ENCOUNTER — Other Ambulatory Visit: Payer: Self-pay

## 2019-10-09 ENCOUNTER — Ambulatory Visit: Payer: Medicare Other | Admitting: Orthotics

## 2019-10-09 DIAGNOSIS — M2141 Flat foot [pes planus] (acquired), right foot: Secondary | ICD-10-CM

## 2019-10-09 DIAGNOSIS — M2142 Flat foot [pes planus] (acquired), left foot: Secondary | ICD-10-CM

## 2019-10-09 DIAGNOSIS — M79671 Pain in right foot: Secondary | ICD-10-CM

## 2019-10-09 DIAGNOSIS — M2042 Other hammer toe(s) (acquired), left foot: Secondary | ICD-10-CM

## 2019-10-09 DIAGNOSIS — M2041 Other hammer toe(s) (acquired), right foot: Secondary | ICD-10-CM

## 2019-10-09 NOTE — Progress Notes (Signed)

## 2019-11-02 ENCOUNTER — Other Ambulatory Visit: Payer: Self-pay | Admitting: Sports Medicine

## 2019-11-02 DIAGNOSIS — M2141 Flat foot [pes planus] (acquired), right foot: Secondary | ICD-10-CM

## 2019-11-02 DIAGNOSIS — M19079 Primary osteoarthritis, unspecified ankle and foot: Secondary | ICD-10-CM

## 2019-11-02 DIAGNOSIS — M2041 Other hammer toe(s) (acquired), right foot: Secondary | ICD-10-CM

## 2019-11-05 ENCOUNTER — Telehealth: Payer: Self-pay | Admitting: *Deleted

## 2019-11-05 NOTE — Telephone Encounter (Signed)
I would recommend a UA first.

## 2019-11-05 NOTE — Telephone Encounter (Signed)
Patient called c/o frequent urination only, that started over the weekend. No burning with urination, lower back discomfort which is normal for her. Reports getting up several times over night, I advised OV for urine check,she declined states she doesn't feel good and would like Rx via phone if possible? Please advise

## 2019-11-06 ENCOUNTER — Encounter: Payer: Self-pay | Admitting: Obstetrics and Gynecology

## 2019-11-06 ENCOUNTER — Other Ambulatory Visit: Payer: Self-pay

## 2019-11-06 ENCOUNTER — Ambulatory Visit (INDEPENDENT_AMBULATORY_CARE_PROVIDER_SITE_OTHER): Payer: Medicare Other | Admitting: Obstetrics and Gynecology

## 2019-11-06 VITALS — BP 124/84

## 2019-11-06 DIAGNOSIS — N898 Other specified noninflammatory disorders of vagina: Secondary | ICD-10-CM

## 2019-11-06 DIAGNOSIS — R350 Frequency of micturition: Secondary | ICD-10-CM

## 2019-11-06 LAB — WET PREP FOR TRICH, YEAST, CLUE

## 2019-11-06 MED ORDER — PHENAZOPYRIDINE HCL 200 MG PO TABS
200.0000 mg | ORAL_TABLET | Freq: Three times a day (TID) | ORAL | 0 refills | Status: AC | PRN
Start: 2019-11-06 — End: ?

## 2019-11-06 MED ORDER — FLUCONAZOLE 150 MG PO TABS
150.0000 mg | ORAL_TABLET | ORAL | 0 refills | Status: DC | PRN
Start: 2019-11-06 — End: 2020-08-06

## 2019-11-06 NOTE — Progress Notes (Signed)
Jamie Ayers 05-14-1946 [id number]  SUBJECTIVE:  74 y.o. [id number] female presents for vaginal itching and urinary frequency.  She denies vaginal discharge or odor.  No vaginal bleeding.  She does use vaginal boric acid capsules and says she tries to use an applicator to get the capsule higher in the vagina as in the lower vagina she is not having any itching, she feels the itching is at the vaginal cuff area/upper vagina.  She does use prophylactic Macrodantin for UTI prophylaxis.  Current Outpatient Medications  Medication Sig Dispense Refill  . ascorbic acid (VITAMIN C) 500 MG tablet Take by mouth.    Marland Kitchen aspirin 81 MG EC tablet aspirin 81 mg tablet,delayed release  Take 1 tablet every day by oral route.    Marland Kitchen atorvastatin (LIPITOR) 20 MG tablet Take 1 tablet (20 mg total) by mouth daily. 90 tablet 3  . budesonide (PULMICORT) 0.5 MG/2ML nebulizer solution Inhale into the lungs.    . Calcium Carbonate-Vitamin D (CALCIUM + D PO) Take 1 tablet by mouth daily.     . calcium-vitamin D (OSCAL WITH D) 500-200 MG-UNIT TABS tablet Take by mouth.    . carvedilol (COREG CR) 40 MG 24 hr capsule TAKE 1 CAPSULE BY MOUTH EVERY DAY IN THE EVENING 90 capsule 3  . cycloSPORINE (RESTASIS) 0.05 % ophthalmic emulsion Place 1 drop into both eyes daily.    . diclofenac sodium (VOLTAREN) 1 % GEL diclofenac 1 % topical gel  APPLY 2 GRAMS TOPICALLY TO AFFECTED AREA THREE TIMES DAILY AS NEEDED    . Empagliflozin (JARDIANCE PO) Take by mouth.    . esomeprazole (NEXIUM) 40 MG capsule Take 1 capsule (40 mg total) by mouth 2 (two) times daily before a meal. 180 capsule 1  . furosemide (LASIX) 40 MG tablet Take 1 tablet (40 mg total) by mouth daily. 90 tablet 3  . glipiZIDE (GLUCOTROL XL) 5 MG 24 hr tablet glipizide ER 5 mg tablet, extended release 24 hr  TAKE 1 TABLET BY MOUTH ONCE DAILY    . metFORMIN (GLUCOPHAGE) 500 MG tablet Take by mouth 2 (two) times daily with a meal.    . montelukast (SINGULAIR) 10 MG tablet  Take 10 mg by mouth at bedtime.    . Multiple Vitamin (MULTIVITAMIN) tablet Take 1 tablet by mouth daily.      . Multiple Vitamins-Minerals (CENTRUM SILVER) tablet Take by mouth.    . nitrofurantoin (MACRODANTIN) 50 MG capsule Take by mouth.    . NON FORMULARY CPAP THERAPY    . NONFORMULARY OR COMPOUNDED ITEM Boric acid vaginal suppositories 600 mg one per vagina as needed for yeast 30 each 0  . nystatin-triamcinolone (MYCOLOG II) cream Apply 1 application topically 2 (two) times daily. 30 g 1  . omega-3 acid ethyl esters (LOVAZA) 1 g capsule Take 2 capsules (2 g total) by mouth 2 (two) times daily. 360 capsule 3  . Omega-3 Fatty Acids (FISH OIL) 1000 MG CAPS Take by mouth.    . Oxycodone HCl 10 MG TABS Take 1 tablet by mouth as needed.  0  . oxyCODONE-acetaminophen (PERCOCET/ROXICET) 5-325 MG tablet TAKE 1 TABLET BY MOUTH TWICE DAILY AS NEEDED FOR PAIN    . polyethylene glycol (MIRALAX / GLYCOLAX) packet Take 17 g by mouth daily.    . QUEtiapine (SEROQUEL) 25 MG tablet quetiapine 25 mg tablet  TAKE 1 TABLET BY MOUTH AT NIGHT AS DIRECTED    . sacubitril-valsartan (ENTRESTO) 49-51 MG Take 1 tablet by mouth 2 (  two) times daily. 20 tablet 0  . spironolactone (ALDACTONE) 25 MG tablet Take 1/2 tablet ( 12.5 mg ) daily 45 tablet 3  . zolpidem (AMBIEN) 10 MG tablet Take 1 tablet (10 mg total) by mouth at bedtime as needed. For sleep 30 tablet 3  . albuterol (PROVENTIL HFA;VENTOLIN HFA) 108 (90 BASE) MCG/ACT inhaler Inhale 2 puffs into the lungs every 4 (four) hours as needed for wheezing. 3.7 g 0  . gabapentin (NEURONTIN) 300 MG capsule Take by mouth.     No current facility-administered medications for this visit.   Allergies: Midazolam, Midazolam hcl, Codeine, Morphine and related, Paba derivatives, Sulfonamide derivatives, and Sulfamethoxazole  No LMP recorded. Patient is postmenopausal.  Past medical history,surgical history, problem list, medications, allergies, family history and social  history were all reviewed and documented as reviewed in the EPIC chart.  ROS:  Feeling well. No dyspnea or chest pain on exertion.  No abdominal pain, change in bowel habits, black or bloody stools.  +urinary frequency. GYN ROS: as described in HPI.   OBJECTIVE:  BP 124/84  The patient appears well, alert, oriented x 3, in no distress. PELVIC EXAM: VULVA: normal appearing vulva with no masses, tenderness or lesions, VAGINA: normal appearing vagina with normal color and discharge, no lesions, CERVIX: normal appearing cervix without discharge or lesions, surgically absent, WET MOUNT done - results: negative for pathogens, normal epithelial cells  Chaperone: [name] present during the examination  ASSESSMENT:  74 y.o. [id number] here for vaginal irritation and urinary frequency.  PLAN:  History of recurrent yeast infections in the past, exam with scant discharge today and the wet prep is negative.  Will empirically treat with Diflucan 150 mg x 1 dose, and she will have a total of 6 tablets to have available with one refill if she does get yeast infections periodically and has found good success with this regimen in the past.  She is reassured the urinalysis is not highly indicative of UTI but we will run for culture.  She is already taking a prophylactic antibiotic to prevent UTIs.  I will also give her a prescription for pyridium 200 mg 3 times daily as needed for bladder pain/spasm.  Follow up if no improvement.   Joseph Pierini MD 11/06/19

## 2019-11-06 NOTE — Patient Instructions (Signed)
Try vaginal suppository like Azo brand probiotic to see if that helps Use the fluconazole as needed

## 2019-11-06 NOTE — Telephone Encounter (Signed)
Patient informed with below, declined to leave u/a at this time.

## 2019-11-08 LAB — URINALYSIS, COMPLETE W/RFL CULTURE
Bilirubin Urine: NEGATIVE
Hyaline Cast: NONE SEEN /LPF
Ketones, ur: NEGATIVE
Nitrites, Initial: NEGATIVE
Protein, ur: NEGATIVE
Specific Gravity, Urine: 1.01 (ref 1.001–1.03)
pH: 5.5 (ref 5.0–8.0)

## 2019-11-08 LAB — URINE CULTURE
MICRO NUMBER:: 10490449
SPECIMEN QUALITY:: ADEQUATE

## 2019-11-08 LAB — CULTURE INDICATED

## 2019-12-10 ENCOUNTER — Encounter: Payer: Self-pay | Admitting: Cardiovascular Disease

## 2019-12-10 ENCOUNTER — Other Ambulatory Visit: Payer: Self-pay

## 2019-12-10 ENCOUNTER — Telehealth: Payer: Self-pay

## 2019-12-10 ENCOUNTER — Ambulatory Visit (INDEPENDENT_AMBULATORY_CARE_PROVIDER_SITE_OTHER): Payer: Medicare Other | Admitting: Cardiovascular Disease

## 2019-12-10 VITALS — BP 138/72 | HR 69 | Ht 65.0 in | Wt 210.2 lb

## 2019-12-10 DIAGNOSIS — I10 Essential (primary) hypertension: Secondary | ICD-10-CM | POA: Diagnosis not present

## 2019-12-10 DIAGNOSIS — I428 Other cardiomyopathies: Secondary | ICD-10-CM | POA: Diagnosis not present

## 2019-12-10 DIAGNOSIS — Z9989 Dependence on other enabling machines and devices: Secondary | ICD-10-CM

## 2019-12-10 DIAGNOSIS — E118 Type 2 diabetes mellitus with unspecified complications: Secondary | ICD-10-CM

## 2019-12-10 DIAGNOSIS — D141 Benign neoplasm of larynx: Secondary | ICD-10-CM

## 2019-12-10 DIAGNOSIS — U071 COVID-19: Secondary | ICD-10-CM

## 2019-12-10 DIAGNOSIS — E668 Other obesity: Secondary | ICD-10-CM

## 2019-12-10 DIAGNOSIS — I5042 Chronic combined systolic (congestive) and diastolic (congestive) heart failure: Secondary | ICD-10-CM | POA: Diagnosis not present

## 2019-12-10 DIAGNOSIS — G4733 Obstructive sleep apnea (adult) (pediatric): Secondary | ICD-10-CM

## 2019-12-10 DIAGNOSIS — E669 Obesity, unspecified: Secondary | ICD-10-CM

## 2019-12-10 DIAGNOSIS — E782 Mixed hyperlipidemia: Secondary | ICD-10-CM

## 2019-12-10 MED ORDER — ENTRESTO 97-103 MG PO TABS: 1.0000 | ORAL_TABLET | Freq: Two times a day (BID) | ORAL | 3 refills | Status: DC

## 2019-12-10 MED ORDER — FUROSEMIDE 20 MG PO TABS: 20.0000 mg | ORAL_TABLET | Freq: Every day | ORAL | 3 refills | Status: DC

## 2019-12-10 NOTE — Telephone Encounter (Signed)
Left detailed message on pts cell phone per DPR. Notified it was fine to begin taking her increased dose of entresto prior to her echo. Notified to call back with any questions.

## 2019-12-10 NOTE — Patient Instructions (Signed)
Medication Instructions:  DECREASE YOUR LASIX TO 20MG  DAILY  INCREASE YOUR ENTRESTO TO 97-103  *If you need a refill on your cardiac medications before your next appointment, please call your pharmacy*   Lab Work: FASTING LABS: CBC CMP LIPID HGB A1C TSH FREE T4 BNP PROBNP UA WITH C/S If you have labs (blood work) drawn today and your tests are completely normal, you will receive your results only by: Marland Kitchen MyChart Message (if you have MyChart) OR . A paper copy in the mail If you have any lab test that is abnormal or we need to change your treatment, we will call you to review the results.   Testing/Procedures: Your physician has requested that you have an echocardiogram. Echocardiography is a painless test that uses sound waves to create images of your heart. It provides your doctor with information about the size and shape of your heart and how well your heart's chambers and valves are working. This procedure takes approximately one hour. There are no restrictions for this procedure.  [place]   Follow-Up: At [facility], you and your health needs are our priority.  As part of our continuing mission to provide you with exceptional heart care, we have created designated Provider Care Teams.  These Care Teams include your primary Cardiologist (physician) and Advanced Practice Providers (APPs -  Physician Assistants and Nurse Practitioners) who all work together to provide you with the care you need, when you need it.  We recommend signing up for the patient portal called "MyChart".  Sign up information is provided on this After Visit Summary.  MyChart is used to connect with patients for Virtual Visits (Telemedicine).  Patients are able to view lab/test results, encounter notes, upcoming appointments, etc.  Non-urgent messages can be sent to your provider as well.   To learn more about what you can do with MyChart, go to [email or web address].    Your next  appointment:   4 month(s)  The format for your next appointment:   In Person  Provider:   Shelva Majestic, MD

## 2019-12-10 NOTE — Progress Notes (Signed)
Patient ID: Jamie Ayers, female   DOB: 08-13-1945, 74 y.o.   MRN: [id number]     Primary  M.D.: Dr. Kevan Ny  HPI: Jamie Ayers is a 74 y.o. female who presents to the office for a 2 month cardiology evaluation.  .  Jamie Ayers has a remote history of a nonischemic cardiomyopathy.  In December 2005 her ejection fraction was 20%. With aggressive medical therapy LV function normalized to approximately 55%. She has a history of obstructive sleep apnea and has been utilizing CPAP therapy since 2008. 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 She also has undergone papilloma from a breast duct removal at the time of the hysterectomy.  An echo Doppler study on 03/20/2013 showed an ejection fraction at 50-55% with mild concentric left ventricular hypertrophy. She had normal diastolic function. There was evidence for mild aortic insufficiency and mild mitral regurg patient with a centrally directed jet. Her left atrium was mildly dilated. She recently had repeat laboratory done by Dr. Kevan Ny.  Laboratory on 10/05/2013 revealed hemoglobin11.6/ hematocrit 36.0.  She did have microcytic indices at 73.5.  She denies any history of sickle cell trait or thalassemia.  She is unaware of blood loss.  Glucose was elevated at 147.  She tells me in the past Dr. Marlou Sa had tried metformin but she did not tolerate this.  TSH was 1.054.  She is continuing to use CPAP with 100% compliance.  She denies daytime fatigue or awareness of breakthrough snoring.   On 01/28/2015 a follow-up echo Doppler study revealed an ejection fraction at 60-65% without wall motion abnormalities.  There was grade 1 diastolic dysfunction, mild aortic and mild mitral regurgitation.  The left atrium was mildly dilated.  Recently, she has noticed some mild myalgias in her legs.    She was seen by Dr. Percival Spanish in 2017 with complaints of fatigue  and some shortness of breath.  BNP was normal at 12.7.  Blood work by Dr. Marlou Sa from 03/02/2017revealed  a TSH of 0.65.  She had microcytic indices with an MCV of 73, hemoglobin 12.4.  Her creatinine had risen to 1.58 with a BUN of 44.  Calcium was mildly elevated at 10.9.  Saw her in follow-up one month later.  She continues to use CPAP therapy.  I was able to review a download from 08/13/2015 through 09/11/2015.  She has S9.  AutoSet CPAP ResMed unit, 95th percentile pressure is 15.5.  AHI is excellent at 0.5.  She is compliant with 97% of usage stays averaging approximately 8 hours sleep per night.    Repeat blood work revealed a BUN of 47 and creatinine of 1.24.  Her calcium was mildly increased at 10.8.  Lipid studies were markedly abnormal.  Apparently, she had stopped taking atorvastatin the past 2 months.  Her total cholesterol had risen to 297, triglycerides 303, HDL 39, VLDL 61, and LDL 197.  Her hemoglobin is 11.5 with hematocrit 36.1.  She has had consistent microcytosis.  TSH remained normal at 1.19.    She was hospitalized at [facility] with pancreatitis.  She had developed significant vomiting and nausea, and on 06/17/2016 she was seen at urgent care and was found to have an amylase of 1120 and a lipase of 624.  Her Januvia was stopped as well, as Zetia.   When I saw her in F/U she denied any chest pain or  shortness  of breath.  She has noticed some difficulty with her legs and feet.  She notes cramping when she walks but at times note some muscle aches, not exertionally.  She also has noticed a vague nonexertional short-lived chest sensation which resolved spontaneously.  She does note some occasional ankle swelling.  She has been using CPAP with 100% compliance and can't sleep without it.    She received a new CPAP machine on June 13, 2017.  A download was obtained in the office today from February 10 - August 29, 2017.  She is meeting Medicare compliance standards with 90% of usage days.   Usage greater than 4 hours was 26 days or 87%.  Days that she did not use her machine was when she had received a soap clean machine and not realize when she was out of town the reservoir requirement.  She is averaging 8 hours and 30 minutes of CPAP use per night.  She has been set on a auto CPAP minimum pressure of 8 and a maximum of 20.  AHI is 0.6.  There is no significant leak.  She is requiring high pressures with 95th percentile pressure at 18.2 with maximum average pressure at 19.2.  She is unaware of breakthrough snoring.  Her sleep is restorative.  She continues to experience times sleepiness and Epworth sleepiness scale score was endorsed  and calculated at 18.  She has difficulty with sleep initiation.  She often goes to bed at 11 PM but cannot fall asleep for several hours and at times is still awake at 2 AM.  She has a peripheral neuropathy.    When seen  in March 2019 she was having more fatigue and a download of her CPAP unit indicated that she was requiring very high pressures.  As result I changed her CPAP minimum pressure from 8 up to 11 cm of water pressure with a maximum of 20.  She had  called our office with complaints of increasing fatigue and shortness of breath with exertion.  She denies chest tightness fever chills or night sweats.  She was worked into my office on April 13, 2018 and admitted to illness in her feet, paresthesias from neuropathy and was having issues with back discomfort and lumbar disc disease.  She was having issues with spinal stenosis and had received a short course of prednisone therapy.  She admitted to exertional dyspnea and palpitations.  She was using CPAP with 100% compliance.   She underwent an echo Doppler study on April 27, 2018 which continued to show normal LV function with an EF of 60 to 65%.  There was grade 1 diastolic dysfunction and mild aortic regurgitation.  Lower extremity Doppler study with ABIs that were similar to her prior study 1 year  previously.  ABIs were within normal limits.  She has continued to use CPAP with 100% compliance.  A download was obtained in the office today from November 2 through May 21, 2018.  She has an air sense 10 AutoSet unit with a minimum pressure at 11 with maximum up to 20.  Her 95th percentile pressure is 18.7 with a maximum pressure of 19.4.  AHI is excellent at 0.6.    When I  saw her in December 2019 I reviewed her lower extremity Doppler assessment which was unchanged from previously and she had normal ABIs.  Her blood pressure was stable on Coreg SR 40 mg, furosemide 40 mg, valsartan 160 mg.  At that time I discussed the  possibility of discontinuing her digoxin.    She was last evaluated in a telemedicine visit in May 2020.  She has continued to use CPAP with 100% compliance since her last evaluation.  However, she does admit to difficulty in falling asleep.  Dr. Kevan Ny has given her a prescription for Ambien to take as needed.  At times it may take her 1 to 2 hours to fall asleep but she believes when she sleeps she is sleeping well.  A download from April 21 through Nov 08, 2018 showed average usage 7 hours and 59 minutes.  She was 100% compliant.    95th percentile pressure was 18.4 with a maximum average pressure of 19.5.  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  I  saw her on July 10, 2019 and since her previous evaluation she had  developed Covid in November and was quarantined for 3 weeks.  Her symptoms included fevers chills fatigue and loss of appetite.  Presently, she denies any chest pain.  Her last echo Doppler study in November 2019 showed an EF of 60 to 65% with grade 1 diastolic dysfunction.  She continued to use CPAP with 100% compliance.  A download was obtained her 95th percentile CPAP pressure is 16.3 with a maximum average pressure of 17.8.  She  denies any PND orthopnea.  There is no chest pain.   During her evaluation, I recommended she undergo a follow-up echo Doppler study particularly in follow-up of her Covid infection to make certain there was no reduction in LV function.  With her previously documented excellent systolic function, her digoxin was discontinued.  With her diabetes I also discussed Jardiance as a consideration for treatment.  She underwent an echo Doppler study on August 08, 2019.  EF was now reduced at 40 to 45%.  There was grade 2 diastolic dysfunction and abnormal tissue Doppler suggesting elevated left atrial pressure.  There was mild dilation of her ascending aorta at 40 mm.  She was felt to have moderate to severe mitral regurgitation and moderate left atrial dilation.  She was subsequently seen by Dr. Peter Martinique on August 27, 2019 when he was DOD and with her reduced LV function she was transitioned to [facility] 49/51 in place of valsartan and she was started on spironolactone 12.5 mg daily.  It was felt that her MR secondary to reduced LV function and LV dilation.  She has continued to use CPAP.  Her DME company is now [employer].  Download was obtained from March 17 through October 04, 2019 which showed 83% of usage days.  CPAP is set at a minimum pressure of 11 and maximum of 20 with her 95th percentile pressure at 17.9 and maximum average pressure at 19.1.  AHI is 1.0.  She is in need to undergo repeat procedure for her papilloma.    Presently she feels well.  However she admits to decreased energy and feeling sluggish.  She believes she is cold all the time.  She denies any significant chest pain or palpitations.  She continues to go undergo laser treatment every 4 months for her vocal cord papilloma.  She had recently seen Dr. Marlou Sa who checks laboratory.  She continues to use CPAP with 100% compliance and does not sleep without it.  She presents for reevaluation.   Past Medical History:  Diagnosis Date  . Anemia   .  Anxiety disorder   . Asthmatic bronchitis   . Benign essential HTN 08/06/11   ECHO-EF>55%  . CHF (congestive heart failure) (HCC)    HeFPEF  . COVID-19   . Diabetes mellitus without complication ([place])   . Disorder of vocal cord   . GERD (gastroesophageal reflux disease)   . Hernia   . Hyperlipidemia 03/14/12   Lexiscan myoview-WNL; unchanged from previous study.  . OSA on CPAP 03/28/07   sleep study-[facility]-AHI 9.42/HR and during REM sleep '@37' .50/hr. The average 02 sat duringREM AND NREM was 97.0%  . Thyroid disease   . Upper respiratory infection     Past Surgical History:  Procedure Laterality Date  . ABDOMINAL HYSTERECTOMY  2013   TAH BSO  . BREAST EXCISIONAL BIOPSY Left 2017   papilloma  . BREAST RECONSTRUCTION Bilateral    Patient had fat pads removed from both sides   . BREAST SURGERY     Papilloma  . DILATION AND CURETTAGE OF UTERUS    . ENDOMETRIAL BIOPSY    . HERNIA REPAIR    . HYSTEROSCOPY    . KNEE SURGERY     arthroscopic  . NISSEN FUNDOPLICATION    . OOPHORECTOMY     BSO  . Parathyroid surg    . Vocal Cord Surg      Allergies  Allergen Reactions  . Midazolam Other (See Comments)    REACTION: cardiac arrest  . Midazolam Hcl     Cardiac arrest    VERSED  . Codeine Hives and Itching  . Morphine And Related Hives  . Paba Derivatives Nausea Only    PT REPORTS SEVERE NAUSEA AFTER SURGERY, DENIES VOMITING  . Sulfonamide Derivatives Hives and Itching  . Sulfamethoxazole Hives    Current Outpatient Medications  Medication Sig Dispense Refill  . albuterol (PROVENTIL HFA;VENTOLIN HFA) 108 (90 BASE) MCG/ACT inhaler Inhale 2 puffs into the lungs every 4 (four) hours as needed for wheezing. 3.7 g 0  . ascorbic acid (VITAMIN C) 500 MG tablet Take by mouth.    Marland Kitchen aspirin 81 MG EC tablet aspirin 81 mg tablet,delayed release  Take 1 tablet every day by oral route.    Marland Kitchen atorvastatin (LIPITOR) 20 MG tablet Take 1 tablet (20 mg total) by  mouth daily. 90 tablet 3  . budesonide (PULMICORT) 0.5 MG/2ML nebulizer solution Inhale into the lungs.    . Calcium Carbonate-Vitamin D (CALCIUM + D PO) Take 1 tablet by mouth daily.     . calcium-vitamin D (OSCAL WITH D) 500-200 MG-UNIT TABS tablet Take by mouth.    . carvedilol (COREG CR) 40 MG 24 hr capsule TAKE 1 CAPSULE BY MOUTH EVERY DAY IN THE EVENING 90 capsule 3  . cycloSPORINE (RESTASIS) 0.05 % ophthalmic emulsion Place 1 drop into both eyes daily.    . diclofenac sodium (VOLTAREN) 1 % GEL diclofenac 1 % topical gel  APPLY 2 GRAMS TOPICALLY TO AFFECTED AREA THREE TIMES DAILY AS NEEDED    . Empagliflozin (JARDIANCE PO) Take by mouth.    . esomeprazole (NEXIUM) 40 MG capsule Take 1 capsule (40 mg total) by mouth 2 (two) times daily before a meal. 180 capsule 1  . fluconazole (DIFLUCAN) 150 MG tablet Take 1 tablet (150 mg total) by mouth every 3 (three) days as needed for up to 6 doses. 6 tablet 0  . furosemide (LASIX) 20 MG tablet Take 1 tablet (20 mg total) by mouth daily. 90 tablet 3  . glipiZIDE (GLUCOTROL  XL) 5 MG 24 hr tablet glipizide ER 5 mg tablet, extended release 24 hr  TAKE 1 TABLET BY MOUTH ONCE DAILY    . metFORMIN (GLUCOPHAGE) 500 MG tablet Take by mouth 2 (two) times daily with a meal.    . montelukast (SINGULAIR) 10 MG tablet Take 10 mg by mouth at bedtime.    . Multiple Vitamin (MULTIVITAMIN) tablet Take 1 tablet by mouth daily.      . Multiple Vitamins-Minerals (CENTRUM SILVER) tablet Take by mouth.    . nitrofurantoin (MACRODANTIN) 50 MG capsule Take by mouth.    . NON FORMULARY CPAP THERAPY    . NONFORMULARY OR COMPOUNDED ITEM Boric acid vaginal suppositories 600 mg one per vagina as needed for yeast 30 each 0  . nystatin-triamcinolone (MYCOLOG II) cream Apply 1 application topically 2 (two) times daily. 30 g 1  . omega-3 acid ethyl esters (LOVAZA) 1 g capsule Take 2 capsules (2 g total) by mouth 2 (two) times daily. 360 capsule 3  . Omega-3 Fatty Acids (FISH OIL)  1000 MG CAPS Take by mouth.    . oxyCODONE-acetaminophen (PERCOCET/ROXICET) 5-325 MG tablet TAKE 1 TABLET BY MOUTH TWICE DAILY AS NEEDED FOR PAIN    . phenazopyridine (PYRIDIUM) 200 MG tablet Take 1 tablet (200 mg total) by mouth 3 (three) times daily as needed for pain. Bladder pain 10 tablet 0  . polyethylene glycol (MIRALAX / GLYCOLAX) packet Take 17 g by mouth daily.    . QUEtiapine (SEROQUEL) 25 MG tablet quetiapine 25 mg tablet  TAKE 1 TABLET BY MOUTH AT NIGHT AS DIRECTED    . spironolactone (ALDACTONE) 25 MG tablet Take 1/2 tablet ( 12.5 mg ) daily 45 tablet 3  . zolpidem (AMBIEN) 10 MG tablet Take 1 tablet (10 mg total) by mouth at bedtime as needed. For sleep 30 tablet 3  . gabapentin (NEURONTIN) 300 MG capsule Take by mouth.    . sacubitril-valsartan (ENTRESTO) 97-103 MG Take 1 tablet by mouth 2 (two) times daily. 180 tablet 3   No current facility-administered medications for this visit.    Social History   Socioeconomic History  . Marital status: Married    Spouse name: Not on file  . Number of children: Not on file  . Years of education: Not on file  . Highest education level: Not on file  Occupational History  . Not on file  Tobacco Use  . Smoking status: Never Smoker  . Smokeless tobacco: Never Used  Vaping Use  . Vaping Use: Never used  Substance and Sexual Activity  . Alcohol use: No    Alcohol/week: 0.0 standard drinks  . Drug use: No  . Sexual activity: Yes    Birth control/protection: Post-menopausal, Surgical    Comment: HYST-1st intercourse 74 yo-Fewer than 5 partners,DES NEG  Other Topics Concern  . Not on file  Social History Narrative  . Not on file   Social Determinants of Health   Financial Resource Strain:   . Difficulty of Paying Living Expenses:   Food Insecurity:   . Worried About Charity fundraiser in the Last Year:   . Arboriculturist in the Last Year:   Transportation Needs:   . Film/video editor (Medical):   Marland Kitchen Lack of  Transportation (Non-Medical):   Physical Activity:   . Days of Exercise per Week:   . Minutes of Exercise per Session:   Stress:   . Feeling of Stress :   Social Connections:   .  Frequency of Communication with Friends and Family:   . Frequency of Social Gatherings with Friends and Family:   . Attends Religious Services:   . Active Member of Clubs or Organizations:   . Attends Archivist Meetings:   Marland Kitchen Marital Status:   Intimate Partner Violence:   . Fear of Current or Ex-Partner:   . Emotionally Abused:   Marland Kitchen Physically Abused:   . Sexually Abused:     Family History  Problem Relation Age of Onset  . Diabetes Father   . Hypertension Father   . Cancer Father        Prostate cancer  . Arthritis Father   . Gout Father   . Hypertension Mother   . Stroke Mother   . Breast cancer Maternal Grandmother        Age 87's  . Ovarian cancer Paternal Grandmother   . Breast cancer Sister        Age 26  . Kidney disease Sister    Social history is notable in that she is married has 4 children 9 grandchildren. There is no tobacco or alcohol use.  ROS General: Negative; No fevers, chills, or night sweats; positive for fatigue HEENT: Occasional hoarseness.  In the past she has had difficulty with vocal cord papillomas. No changes in vision or hearing, sinus congestion; papillomas on her vocal cord Pulmonary: Negative; No cough, wheezing, shortness of breath, hemoptysis Cardiovascular: See HPI GI: Negative; No nausea, vomiting, diarrhea, or abdominal pain GU: Negative; No dysuria, hematuria, or difficulty voiding Musculoskeletal: Positive for mild myalgias; spinal stenosis Hematologic/Oncology: Negative; no easy bruising, bleeding Endocrine: Positive for diabetes mellitus.  No cold or heat intolerance. Neuro: Negative; no changes in balance, headaches Skin: Negative; No rashes or skin lesions Psychiatric: Negative; No behavioral problems, depression Sleep: Positive for sleep  apnea; she continues to be on CPAP.  No residual snoring, daytime sleepiness, hypersomnolence, bruxism, restless legs, hypnogognic hallucinations, no cataplexy Other comprehensive 14 point system review is negative.   PE BP 138/72   Pulse 69   Ht '5\' 5"'  (1.651 m)   Wt 210 lb 3.2 oz (95.3 kg)   BMI 34.98 kg/m    Repeat blood pressure by me 128/70  Wt Readings from Last 3 Encounters:  12/10/19 210 lb 3.2 oz (95.3 kg)  10/05/19 203 lb 3.2 oz (92.2 kg)  08/27/19 201 lb (91.2 kg)   General: Alert, oriented, no distress.  Skin: normal turgor, no rashes, warm and dry HEENT: Normocephalic, atraumatic. Pupils equal round and reactive to light; sclera anicteric; extraocular muscles intact;  Nose without nasal septal hypertrophy Mouth/Parynx benign; Mallinpatti scale 3/4 Neck: No JVD, no carotid bruits; normal carotid upstroke Lungs: clear to ausculatation and percussion; no wheezing or rales Chest wall: without tenderness to palpitation Heart: PMI not displaced, RRR, s1 s2 normal, 1/6 systolic murmur, no diastolic murmur, no rubs, gallops, thrills, or heaves Abdomen: soft, nontender; no hepatosplenomehaly, BS+; abdominal aorta nontender and not dilated by palpation. Back: no CVA tenderness Pulses 2+ Musculoskeletal: full range of motion, normal strength, no joint deformities Extremities: no clubbing cyanosis or edema, Homan's sign negative  Neurologic: grossly nonfocal; Cranial nerves grossly wnl Psychologic: Normal mood and affect   ECG (independently read by me): NSR 60; LVH , no ectopy, normal interals  October 05, 2019 ECG (independently read by me): Sinus rhythm at 72 bpm, LVH by voltage criteria in aVL.  No ectopy.  Normal intervals.  January 2021 ECG (independently read by me): Normal sinus  rhythm at 62 bpm.  Borderline first-degree block with a PR normal at 202 ms.  No ectopy.  Normal QTc interval at 381 ms  December 2019 ECG (independently read by me): Normal sinus rhythm at 66  bpm.  First-degree AV block with appeared normal to 10 ms.  LVH by voltage criteria.  No ectopy.  October 2019 ECG (independently read by me): Normal sinus rhythm at 70 bpm.  LVH by voltage criteria probable left atrial enlargement.  Non-specific ST changes.  September 2018 ECG (independently read by me): Normal sinus rhythm at 64 bpm.  First-degree AV block.  PR interval 218 ms.  LVH by voltage criteria in aVL.  January 2018 ECG (independently read by me): Sinus bradycardia with first-degree AV block.  LVH by voltage criteria.  No significant ST-T changes.  October 2017 ECG (independently read by me): Normal sinus rhythm at 61 bpm.  LVH by voltage.  Borderline first-degree AV block with PR interval 206 ms.  April 2017 ECG (independently read by me): Normal sinus rhythm at 64 bpm.  First-degree AV block with a PR interval of 218 ms.  ECG (independently read by me): Normal sinus rhythm at 68 bpm.  LVH by voltage in aVL.  July 2016 ECG (independently read by me): Normal sinus rhythm at 64 bpm..  Borderline LVH.  No cigarette ST changes.  August 2015 ECG (independently read by me): Normal sinus rhythm at 67 beats per minute.  Moderate voltage criteria for LVH.  No significant ST changes.  Borderline first degree AV block with a PR interval at 208 ms.  10/08/2013 ECG (independently read by me): Sinus rhythm at 59 beats per minute.  Borderline first-degree block with PR interval of 220 ms.  QTc interval normal.  Prior 04/17/2013 ECG: Normal sinus rhythm at 62 beats per minute. Borderline first degree AV block is no longer present with her PR interval now at 184 ms.  LABS: BMP Latest Ref Rng & Units 08/31/2019 06/15/2016 04/05/2016  Glucose 65 - 99 mg/dL 137(H) 150(H) 139(H)  BUN 8 - 27 mg/dL 29(H) 29(H) 47(H)  Creatinine 0.57 - 1.00 mg/dL 0.98 1.33(H) 1.24(H)  BUN/Creat Ratio 12 - 28 30(H) 22 -  Sodium 134 - 144 mmol/L 143 137 140  Potassium 3.5 - 5.2 mmol/L 4.6 5.0 4.4  Chloride 96 - 106  mmol/L 104 103 102  CO2 20 - 29 mmol/L '23 21 28  ' Calcium 8.7 - 10.3 mg/dL 10.6(H) 10.1 10.8(H)    Hepatic Function Latest Ref Rng & Units 06/15/2016 04/05/2016 10/05/2013  Total Protein 6.0 - 8.5 g/dL 7.3 7.7 6.9  Albumin 3.5 - 4.8 g/dL 4.4 4.4 4.1  AST 0 - 40 IU/L '17 15 14  ' ALT 0 - 32 IU/L '15 14 20  ' Alk Phosphatase 39 - 117 IU/L 83 87 86  Total Bilirubin 0.0 - 1.2 mg/dL 1.3(H) 0.7 0.8   CBC Latest Ref Rng & Units 06/15/2016 06/15/2016 04/05/2016  WBC 4.6 - 10.2 K/uL 20.2(A) 20.2(HH) 7.3  Hemoglobin 12.2 - 16.2 g/dL 11.4(A) 11.5 11.5(L)  Hematocrit 37.7 - 47.9 % 34.7(A) 34.1 36.1  Platelets 150 - 379 x10E3/uL 230 - 264   Lab Results  Component Value Date   MCV 72.8 (A) 06/15/2016   MCV 70 (L) 06/15/2016   MCV 72.3 (L) 04/05/2016   Lab Results  Component Value Date   TSH 1.19 04/05/2016   Lab Results  Component Value Date   HGBA1C 8.4 (H) 10/08/2013   Lipid Panel  Component Value Date/Time   CHOL 177 07/14/2016 1031   TRIG 186 (H) 07/14/2016 1031   HDL 36 (L) 07/14/2016 1031   CHOLHDL 4.9 07/14/2016 1031   VLDL 37 (H) 07/14/2016 1031   LDLCALC 104 (H) 07/14/2016 1031    RADIOLOGY: No results found.  IMPRESSION: 1. Chronic combined systolic and diastolic CHF, NYHA class 2 ([place])   2. NICM (nonischemic cardiomyopathy) ([place])   3. Essential hypertension   4. OSA on CPAP   5. Mixed hyperlipidemia   6. Papilloma of larynx   7. Moderate obesity   8. COVID-19 virus infection   9. Type 2 diabetes mellitus with complication, without long-term current use of insulin ([place])     ASSESSMENT AND PLAN: Ms. Dutan is a 74 year-old African-American female who has a history of morbid obesity and a history of a nonischemic cardiomyopathy with an ejection fraction of 20% initially documented in 2005 with subsequent normalization. Her nuclear perfusion study in September 2013 showed normal perfusion without scar or ischemia. An echo Doppler study in August 2016  showed normal  systolic function with an EF of 60 to 60% and there was grade 1 diastolic dysfunction.  There was mild AR and mild MR.  Her left atrium was mildly dilated.   Her echo Doppler study in November 2019 showed EF at 60 to 65% with grade 1 diastolic dysfunction and mild aortic insufficiency.  As recently, she has been on a medical regimen consisting of long-acting carvedilol at 40 mg daily, furosemide 40 mg, and  digoxin 0.125 mg.  With her excellent LV function, I have recommended discontinuance of digoxin in January 2021.  With her recent Covid injection I recommended a follow-up echo Doppler study which demonstrated an EF reduced at 40 to 45% and there was evidence for moderately severe mitral regurgitation and grade 2 diastolic dysfunction.  Her ARB therapy has been transitioned to Entresto 49/51 mg twice a day and spironolactone was also added to her regimen.  When I last saw her in April 2021 her blood pressure was low and I was unable to further titrate Entresto.  Presently, her blood pressure has stabilized and is now adequate to allow for further titration.  As result I will increase Entresto to 97/103 mg twice a day.  I will decrease her Lasix from 40 mg down to 20 mg.  She continues to be on long-acting carvedilol 40 mg daily and apparently did not tolerate the short acting twice daily regimen.  She is on atorvastatin 20 mg and omega-3 fatty acid 2 capsules twice daily for mixed hyperlipidemia.  She is on Jardiance which also will be beneficial for her CHF.  She continues to be on spironolactone 12.5 mg daily.  She continues to use CPAP with excellent compliance.  I have recommended that she undergo a follow-up echo Doppler study in approximately 4 months since initiation of Entresto with maximization of therapy and I will see her back in the office for follow-up evaluation after echo Doppler assessment.  Troy Sine, MD, [facility]  12/12/2019 10:11 PM

## 2019-12-11 ENCOUNTER — Telehealth: Payer: Self-pay | Admitting: Sports Medicine

## 2019-12-11 NOTE — Telephone Encounter (Signed)
Called lvm to schedule pt for foot pain

## 2019-12-12 ENCOUNTER — Other Ambulatory Visit: Payer: Self-pay

## 2019-12-12 ENCOUNTER — Encounter: Payer: Self-pay | Admitting: Cardiovascular Disease

## 2019-12-12 DIAGNOSIS — E118 Type 2 diabetes mellitus with unspecified complications: Secondary | ICD-10-CM

## 2019-12-12 DIAGNOSIS — I10 Essential (primary) hypertension: Secondary | ICD-10-CM

## 2019-12-12 NOTE — Telephone Encounter (Signed)
Spoke with patient. She is still having foot pain. I instructed her to call office in the morning to speak with you to arrange a time to stop by the office to be fitted for a surgical shoe or CAM boot to wear to see if this will help with her right foot pain. -Dr. Chauncey Cruel

## 2019-12-12 NOTE — Telephone Encounter (Signed)
Pt called requesting a call from Dr. Cannon Kettle. Pt would not specify what she was calling in regards to.

## 2019-12-12 NOTE — Telephone Encounter (Signed)
Called and spoke with pt, notified that she is to go ahead and start her increased dose of entresto and we will see how this is helping her based on her Echo. Pt verbalized understanding. Pt inquiring if her lab results are back yet, she states she is sure she has an infection of some sort and she is waiting for these labs to see what antibiotic she needs to be on. Notified the results are not back yet but as soon as they were we would let her know. Pt thankful for the call and had no other questions at this time. Will send to Dr.Kelly to result labs.

## 2019-12-12 NOTE — Telephone Encounter (Signed)
Patient returning call.

## 2019-12-13 ENCOUNTER — Encounter: Payer: Self-pay | Admitting: Sports Medicine

## 2019-12-13 LAB — URINALYSIS
Bilirubin, UA: NEGATIVE
Ketones, UA: NEGATIVE
Nitrite, UA: NEGATIVE
Protein,UA: NEGATIVE
RBC, UA: NEGATIVE
Specific Gravity, UA: 1.015 (ref 1.005–1.030)
Urobilinogen, Ur: 0.2 mg/dL (ref 0.2–1.0)
pH, UA: 6.5 (ref 5.0–7.5)

## 2019-12-13 LAB — CBC
Hematocrit: 31.2 % — ABNORMAL LOW (ref 34.0–46.6)
Hemoglobin: 9.6 g/dL — ABNORMAL LOW (ref 11.1–15.9)
MCH: 22.6 pg — ABNORMAL LOW (ref 26.6–33.0)
MCHC: 30.8 g/dL — ABNORMAL LOW (ref 31.5–35.7)
MCV: 73 fL — ABNORMAL LOW (ref 79–97)
Platelets: 308 10*3/uL (ref 150–450)
RBC: 4.25 x10E6/uL (ref 3.77–5.28)
RDW: 14.8 % (ref 11.7–15.4)
WBC: 8.7 10*3/uL (ref 3.4–10.8)

## 2019-12-13 LAB — LIPID PANEL
Chol/HDL Ratio: 4 ratio (ref 0.0–4.4)
Cholesterol, Total: 184 mg/dL (ref 100–199)
HDL: 46 mg/dL (ref >39)
LDL Chol Calc (NIH): 119 mg/dL — ABNORMAL HIGH (ref 0–99)
Triglycerides: 105 mg/dL (ref 0–149)
VLDL Cholesterol Cal: 19 mg/dL (ref 5–40)

## 2019-12-13 LAB — COMPREHENSIVE METABOLIC PANEL
ALT: 11 IU/L (ref 0–32)
AST: 15 IU/L (ref 0–40)
Albumin/Globulin Ratio: 1.2 (ref 1.2–2.2)
Albumin: 4.1 g/dL (ref 3.7–4.7)
Alkaline Phosphatase: 106 IU/L (ref 48–121)
BUN/Creatinine Ratio: 26 (ref 12–28)
BUN: 33 mg/dL — ABNORMAL HIGH (ref 8–27)
Bilirubin Total: 0.4 mg/dL (ref 0.0–1.2)
CO2: 23 mmol/L (ref 20–29)
Calcium: 10.2 mg/dL (ref 8.7–10.3)
Chloride: 99 mmol/L (ref 96–106)
Creatinine, Ser: 1.29 mg/dL — ABNORMAL HIGH (ref 0.57–1.00)
GFR calc Af Amer: 47 mL/min/{1.73_m2} — ABNORMAL LOW (ref >59)
GFR calc non Af Amer: 41 mL/min/{1.73_m2} — ABNORMAL LOW (ref >59)
Globulin, Total: 3.3 g/dL (ref 1.5–4.5)
Glucose: 107 mg/dL — ABNORMAL HIGH (ref 65–99)
Potassium: 4.4 mmol/L (ref 3.5–5.2)
Sodium: 140 mmol/L (ref 134–144)
Total Protein: 7.4 g/dL (ref 6.0–8.5)

## 2019-12-13 LAB — HEMOGLOBIN A1C
Est. average glucose Bld gHb Est-mCnc: 157 mg/dL
Hgb A1c MFr Bld: 7.1 % — ABNORMAL HIGH (ref 4.8–5.6)

## 2019-12-13 LAB — T4, FREE: Free T4: 1.1 ng/dL (ref 0.82–1.77)

## 2019-12-13 LAB — TSH: TSH: 1.64 u[IU]/mL (ref 0.450–4.500)

## 2019-12-13 LAB — BRAIN NATRIURETIC PEPTIDE: BNP: 37.6 pg/mL (ref 0.0–100.0)

## 2019-12-13 LAB — PRO B NATRIURETIC PEPTIDE: NT-Pro BNP: 185 pg/mL (ref 0–301)

## 2019-12-13 NOTE — Telephone Encounter (Addendum)
Pt presented to office to be fitted for surgical shoe or Cam boot. I asked pt location of her pain and she described the top of her right foot. I informed pt the purpose of the shoe or boot would be to stabilize the structures of the foot to decrease movement in the painful areas therefore hopefully allowing the areas to rest and heal. I explained to pt the cam boot would give the most stabilization because it decreased the use of accessory tendons used by the lower leg to lift the foot pulling on the foot and continuing to irritate the already painful foot. Pt requested to be fitted with the surgical shoe because she had a fractured ankle before and the boot was to cumbersome. I fitted pt with a medium surgical shoe for the right foot size 10, and instructed pt to remain in the surgical shoe as much as possible, but not to drive in it. I told pt that if she was not improving to call for an appt and if some improvement but not well over 2 weeks make an appt. Pt states understanding.

## 2019-12-13 NOTE — Telephone Encounter (Signed)
Thanks I have entered the charge for the surgical shoe -Dr. Chauncey Cruel

## 2019-12-20 ENCOUNTER — Telehealth: Payer: Self-pay

## 2019-12-20 DIAGNOSIS — D509 Iron deficiency anemia, unspecified: Secondary | ICD-10-CM

## 2019-12-20 DIAGNOSIS — I428 Other cardiomyopathies: Secondary | ICD-10-CM

## 2019-12-20 DIAGNOSIS — I10 Essential (primary) hypertension: Secondary | ICD-10-CM

## 2019-12-20 DIAGNOSIS — E118 Type 2 diabetes mellitus with unspecified complications: Secondary | ICD-10-CM

## 2019-12-20 DIAGNOSIS — I5042 Chronic combined systolic (congestive) and diastolic (congestive) heart failure: Secondary | ICD-10-CM

## 2019-12-20 DIAGNOSIS — I5023 Acute on chronic systolic (congestive) heart failure: Secondary | ICD-10-CM

## 2019-12-20 MED ORDER — ATORVASTATIN CALCIUM 40 MG PO TABS
40.0000 mg | ORAL_TABLET | Freq: Every day | ORAL | 3 refills | Status: DC
Start: 2019-12-20 — End: 2020-09-03

## 2019-12-20 NOTE — Telephone Encounter (Signed)
Called and reviewed results with pt. Verbalized understanding and had no other questions at this time. Will send prescription for atorvastatin 40mg  to her preferred pharmacy and mail her lab slips. Will fax results to pcp.

## 2019-12-31 ENCOUNTER — Ambulatory Visit: Payer: Medicare Other | Admitting: Cardiovascular Disease

## 2020-01-01 ENCOUNTER — Other Ambulatory Visit: Payer: Self-pay

## 2020-01-01 ENCOUNTER — Other Ambulatory Visit (HOSPITAL_COMMUNITY): Payer: Medicare Other

## 2020-01-01 ENCOUNTER — Ambulatory Visit (HOSPITAL_COMMUNITY): Payer: Medicare Other | Attending: Cardiology

## 2020-01-01 DIAGNOSIS — D141 Benign neoplasm of larynx: Secondary | ICD-10-CM | POA: Insufficient documentation

## 2020-01-01 DIAGNOSIS — I5042 Chronic combined systolic (congestive) and diastolic (congestive) heart failure: Secondary | ICD-10-CM | POA: Diagnosis not present

## 2020-01-01 DIAGNOSIS — Z9989 Dependence on other enabling machines and devices: Secondary | ICD-10-CM | POA: Insufficient documentation

## 2020-01-01 DIAGNOSIS — E782 Mixed hyperlipidemia: Secondary | ICD-10-CM

## 2020-01-01 DIAGNOSIS — E118 Type 2 diabetes mellitus with unspecified complications: Secondary | ICD-10-CM | POA: Insufficient documentation

## 2020-01-01 DIAGNOSIS — I10 Essential (primary) hypertension: Secondary | ICD-10-CM | POA: Insufficient documentation

## 2020-01-01 DIAGNOSIS — E668 Other obesity: Secondary | ICD-10-CM | POA: Insufficient documentation

## 2020-01-01 DIAGNOSIS — I428 Other cardiomyopathies: Secondary | ICD-10-CM | POA: Diagnosis present

## 2020-01-01 DIAGNOSIS — G4733 Obstructive sleep apnea (adult) (pediatric): Secondary | ICD-10-CM | POA: Diagnosis not present

## 2020-01-01 DIAGNOSIS — U071 COVID-19: Secondary | ICD-10-CM

## 2020-01-01 LAB — IRON,TIBC AND FERRITIN PANEL
Ferritin: 95 ng/mL (ref 15–150)
Iron Saturation: 23 % (ref 15–55)
Iron: 67 ug/dL (ref 27–139)
Total Iron Binding Capacity: 292 ug/dL (ref 250–450)
UIBC: 225 ug/dL (ref 118–369)

## 2020-01-02 NOTE — Telephone Encounter (Signed)
Follow up   Patient would like a return call per the previous message. Please call.

## 2020-01-02 NOTE — Telephone Encounter (Signed)
Lm with a man at the pts home for her to call us back.. he was unable to take my call and asks that we speak to her.

## 2020-01-07 NOTE — Telephone Encounter (Signed)
Spoke with pt, questions regarding lab work answered.

## 2020-01-11 ENCOUNTER — Telehealth: Payer: Self-pay | Admitting: Cardiovascular Disease

## 2020-01-11 ENCOUNTER — Ambulatory Visit: Payer: Medicare Other | Admitting: Orthotics

## 2020-01-11 NOTE — Telephone Encounter (Signed)
Jamie Ayers is not know to cause muscle pain. Any other symptoms?    Leg pain is more common with atorvastatin. She can HOLD atorvastatin for 5 days and see if pain improves.

## 2020-01-11 NOTE — Telephone Encounter (Signed)
LVM2CB 7/23 

## 2020-01-11 NOTE — Telephone Encounter (Signed)
Pt c/o medication issue:  1. Name of Medication: sacubitril-valsartan (ENTRESTO) 97-103 MG  2. How are you currently taking this medication (dosage and times per day)? Take 1 tablet by mouth 2 (two) times daily.  3. Are you having a reaction (difficulty breathing--STAT)?   4. What is your medication issue? She is having leg trouble, states her leg is bothering her really bad. She says she is having leg pain.

## 2020-01-11 NOTE — Telephone Encounter (Signed)
Called and spoke with pt. She reports that she is having pain in her R leg and it was bad enough that she went to her orthopedist who ordered an xray. She states the xray showed nothing. Pt states she is not having any other symptoms besides the R leg pain. She reports that we increase her atorvastatin at her last office visit.  Notified that per our pharmacist, entresto is not known to cause muscle pain. And that leg pain is more common with atorvastatin. Notified she could hold the atorvastatin for 5 days to see if the pain improves. Pt agreeable to this change and verbalized understanding.  She is also asking if she needs to continue the entresto if her Echo was better this time. She reports that she did not start taking her entresto until after she had the echo completed because it took a while for her mail in meds to arrive. She reports she does not want to be on the increased medicine if she does not have to.  Notified I would send this message to [facility] regarding her entresto and notified she should continue taking it until we got back with her. Pt verbalized understanding with no other questions at this time.

## 2020-01-14 NOTE — Telephone Encounter (Signed)
I recommend that she continue to take [facility] since this will be superior to her prior ARB therapy alone

## 2020-01-14 NOTE — Telephone Encounter (Signed)
Follow Up:    Returning Jamie Ayers's call from Friday.

## 2020-01-14 NOTE — Telephone Encounter (Signed)
Spoke to patient Dr.Kelly's advice given.She stated she has 2 different strengths of Entresto.She is currently taking 49/51 mg.She has 97/103 mg.She wants to keep taking 49/51 mg.Message sent to [facility].

## 2020-01-15 NOTE — Telephone Encounter (Signed)
Called patient left Dr.Kelly's advice on personal voice mail.

## 2020-01-15 NOTE — Telephone Encounter (Signed)
Okay to continue the Entresto 49/51 mg twice a day as long as she continues to be stable

## 2020-01-15 NOTE — Addendum Note (Signed)
Addended by: Kathyrn Lass on: 01/15/2020 04:36 PM   Modules accepted: Orders

## 2020-01-16 ENCOUNTER — Ambulatory Visit (INDEPENDENT_AMBULATORY_CARE_PROVIDER_SITE_OTHER): Payer: Medicare Other | Admitting: Orthotics

## 2020-01-16 ENCOUNTER — Other Ambulatory Visit: Payer: Self-pay

## 2020-01-16 DIAGNOSIS — M2142 Flat foot [pes planus] (acquired), left foot: Secondary | ICD-10-CM

## 2020-01-16 DIAGNOSIS — M2042 Other hammer toe(s) (acquired), left foot: Secondary | ICD-10-CM

## 2020-01-16 DIAGNOSIS — E119 Type 2 diabetes mellitus without complications: Secondary | ICD-10-CM

## 2020-01-16 DIAGNOSIS — M2141 Flat foot [pes planus] (acquired), right foot: Secondary | ICD-10-CM | POA: Diagnosis not present

## 2020-01-16 DIAGNOSIS — M79671 Pain in right foot: Secondary | ICD-10-CM

## 2020-01-16 DIAGNOSIS — M2041 Other hammer toe(s) (acquired), right foot: Secondary | ICD-10-CM

## 2020-01-29 ENCOUNTER — Ambulatory Visit: Payer: Medicare Other | Admitting: Cardiovascular Disease

## 2020-04-02 ENCOUNTER — Other Ambulatory Visit (HOSPITAL_COMMUNITY): Payer: Medicare Other

## 2020-04-15 ENCOUNTER — Other Ambulatory Visit: Payer: Self-pay

## 2020-04-15 ENCOUNTER — Encounter: Payer: Self-pay | Admitting: Cardiovascular Disease

## 2020-04-15 ENCOUNTER — Ambulatory Visit (INDEPENDENT_AMBULATORY_CARE_PROVIDER_SITE_OTHER): Payer: Medicare Other | Admitting: Cardiovascular Disease

## 2020-04-15 VITALS — BP 130/74 | HR 64 | Ht 65.0 in | Wt 209.0 lb

## 2020-04-15 DIAGNOSIS — I10 Essential (primary) hypertension: Secondary | ICD-10-CM | POA: Diagnosis not present

## 2020-04-15 DIAGNOSIS — Z79899 Other long term (current) drug therapy: Secondary | ICD-10-CM

## 2020-04-15 DIAGNOSIS — Z9989 Dependence on other enabling machines and devices: Secondary | ICD-10-CM

## 2020-04-15 DIAGNOSIS — E782 Mixed hyperlipidemia: Secondary | ICD-10-CM

## 2020-04-15 DIAGNOSIS — G4733 Obstructive sleep apnea (adult) (pediatric): Secondary | ICD-10-CM

## 2020-04-15 DIAGNOSIS — D509 Iron deficiency anemia, unspecified: Secondary | ICD-10-CM

## 2020-04-15 DIAGNOSIS — E118 Type 2 diabetes mellitus with unspecified complications: Secondary | ICD-10-CM

## 2020-04-15 DIAGNOSIS — I5042 Chronic combined systolic (congestive) and diastolic (congestive) heart failure: Secondary | ICD-10-CM | POA: Diagnosis not present

## 2020-04-15 NOTE — Patient Instructions (Signed)
Medication Instructions:  Your physician recommends that you continue on your current medications as directed. Please refer to the Current Medication list given to you today.  *If you need a refill on your cardiac medications before your next appointment, please call your pharmacy*   Lab Work: [place]   If you have labs (blood work) drawn today and your tests are completely normal, you will receive your results only by: Marland Kitchen MyChart Message (if you have MyChart) OR . A paper copy in the mail If you have any lab test that is abnormal or we need to change your treatment, we will call you to review the results.   Testing/Procedures: NONE   Follow-Up: At [facility], you and your health needs are our priority.  As part of our continuing mission to provide you with exceptional heart care, we have created designated Provider Care Teams.  These Care Teams include your primary Cardiologist (physician) and Advanced Practice Providers (APPs -  Physician Assistants and Nurse Practitioners) who all work together to provide you with the care you need, when you need it.  We recommend signing up for the patient portal called "MyChart".  Sign up information is provided on this After Visit Summary.  MyChart is used to connect with patients for Virtual Visits (Telemedicine).  Patients are able to view lab/test results, encounter notes, upcoming appointments, etc.  Non-urgent messages can be sent to your provider as well.   To learn more about what you can do with MyChart, go to [email or web address].    Your next appointment:   6 month(s)  The format for your next appointment:   In Person  Provider:   You may see Shelva Majestic, MD or one of the following Advanced Practice Providers on your designated Care Team:    Almyra Deforest, PA-C  Fabian Sharp, PA-C or   Roby Lofts, [place]    Other Instructions

## 2020-04-15 NOTE — Progress Notes (Signed)
Patient ID: Jamie Ayers, female   DOB: 26-Dec-1945, 74 y.o.   MRN: [id number]     Primary  M.D.: Dr. Kevan Ny  HPI: Jamie Ayers is a 74 y.o. female who presents to the office for a 4 month cardiology evaluation.  .  Jamie Ayers has a remote history of a nonischemic cardiomyopathy.  In December 2005 her ejection fraction was 20%. With aggressive medical therapy LV function normalized to approximately 55%. She has a history of obstructive sleep apnea and has been utilizing CPAP therapy since 2008. 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 She also has undergone papilloma from a breast duct removal at the time of the hysterectomy.  An echo Doppler study on 03/20/2013 showed an ejection fraction at 50-55% with mild concentric left ventricular hypertrophy. She had normal diastolic function. There was evidence for mild aortic insufficiency and mild mitral regurg patient with a centrally directed jet. Her left atrium was mildly dilated. She recently had repeat laboratory done by Dr. Kevan Ny.  Laboratory on 10/05/2013 revealed hemoglobin11.6/ hematocrit 36.0.  She did have microcytic indices at 73.5.  She denies any history of sickle cell trait or thalassemia.  She is unaware of blood loss.  Glucose was elevated at 147.  She tells me in the past Dr. Marlou Sa had tried metformin but she did not tolerate this.  TSH was 1.054.  She is continuing to use CPAP with 100% compliance.  She denies daytime fatigue or awareness of breakthrough snoring.   On 01/28/2015 a follow-up echo Doppler study revealed an ejection fraction at 60-65% without wall motion abnormalities.  There was grade 1 diastolic dysfunction, mild aortic and mild mitral regurgitation.  The left atrium was mildly dilated.  Recently, she has noticed some mild myalgias in her legs.    She was seen by Dr. Percival Spanish in 2017 with complaints of fatigue  and some shortness of breath.  BNP was normal at 12.7.  Blood work by Dr. Marlou Sa from 03/02/2017revealed  a TSH of 0.65.  She had microcytic indices with an MCV of 73, hemoglobin 12.4.  Her creatinine had risen to 1.58 with a BUN of 44.  Calcium was mildly elevated at 10.9.  Saw her in follow-up one month later.  She continues to use CPAP therapy.  I was able to review a download from 08/13/2015 through 09/11/2015.  She has S9.  AutoSet CPAP ResMed unit, 95th percentile pressure is 15.5.  AHI is excellent at 0.5.  She is compliant with 97% of usage stays averaging approximately 8 hours sleep per night.    Repeat blood work revealed a BUN of 47 and creatinine of 1.24.  Her calcium was mildly increased at 10.8.  Lipid studies were markedly abnormal.  Apparently, she had stopped taking atorvastatin the past 2 months.  Her total cholesterol had risen to 297, triglycerides 303, HDL 39, VLDL 61, and LDL 197.  Her hemoglobin is 11.5 with hematocrit 36.1.  She has had consistent microcytosis.  TSH remained normal at 1.19.    She was hospitalized at [facility] with pancreatitis.  She had developed significant vomiting and nausea, and on 06/17/2016 she was seen at urgent care and was found to have an amylase of 1120 and a lipase of 624.  Her Januvia was stopped as well, as Zetia.   When I saw her in F/U she denied any chest pain or  shortness  of breath.  She has noticed some difficulty with her legs and feet.  She notes cramping when she walks but at times note some muscle aches, not exertionally.  She also has noticed a vague nonexertional short-lived chest sensation which resolved spontaneously.  She does note some occasional ankle swelling.  She has been using CPAP with 100% compliance and can't sleep without it.    She received a new CPAP machine on June 13, 2017.  A download was obtained in the office today from February 10 - August 29, 2017.  She is meeting Medicare compliance standards with 90% of usage days.   Usage greater than 4 hours was 26 days or 87%.  Days that she did not use her machine was when she had received a soap clean machine and not realize when she was out of town the reservoir requirement.  She is averaging 8 hours and 30 minutes of CPAP use per night.  She has been set on a auto CPAP minimum pressure of 8 and a maximum of 20.  AHI is 0.6.  There is no significant leak.  She is requiring high pressures with 95th percentile pressure at 18.2 with maximum average pressure at 19.2.  She is unaware of breakthrough snoring.  Her sleep is restorative.  She continues to experience times sleepiness and Epworth sleepiness scale score was endorsed  and calculated at 18.  She has difficulty with sleep initiation.  She often goes to bed at 11 PM but cannot fall asleep for several hours and at times is still awake at 2 AM.  She has a peripheral neuropathy.    When seen  in March 2019 she was having more fatigue and a download of her CPAP unit indicated that she was requiring very high pressures.  As result I changed her CPAP minimum pressure from 8 up to 11 cm of water pressure with a maximum of 20.  She had  called our office with complaints of increasing fatigue and shortness of breath with exertion.  She denies chest tightness fever chills or night sweats.  She was worked into my office on April 13, 2018 and admitted to illness in her feet, paresthesias from neuropathy and was having issues with back discomfort and lumbar disc disease.  She was having issues with spinal stenosis and had received a short course of prednisone therapy.  She admitted to exertional dyspnea and palpitations.  She was using CPAP with 100% compliance.   She underwent an echo Doppler study on April 27, 2018 which continued to show normal LV function with an EF of 60 to 65%.  There was grade 1 diastolic dysfunction and mild aortic regurgitation.  Lower extremity Doppler study with ABIs that were similar to her prior study 1 year  previously.  ABIs were within normal limits.  She has continued to use CPAP with 100% compliance.  A download was obtained in the office today from November 2 through May 21, 2018.  She has an air sense 10 AutoSet unit with a minimum pressure at 11 with maximum up to 20.  Her 95th percentile pressure is 18.7 with a maximum pressure of 19.4.  AHI is excellent at 0.6.    When I saw her in December 2019 I reviewed her lower extremity Doppler assessment which was unchanged from previously and she had normal ABIs.  Her blood pressure was stable on Coreg SR 40 mg, furosemide 40 mg, valsartan 160 mg.  At that time I discussed the possibility  of discontinuing her digoxin.    She was last evaluated in a telemedicine visit in May 2020.  She has continued to use CPAP with 100% compliance since her last evaluation.  However, she does admit to difficulty in falling asleep.  Dr. Kevan Ny has given her a prescription for Ambien to take as needed.  At times it may take her 1 to 2 hours to fall asleep but she believes when she sleeps she is sleeping well.  A download from April 21 through Nov 08, 2018 showed average usage 7 hours and 59 minutes.  She was 100% compliant.    95th percentile pressure was 18.4 with a maximum average pressure of 19.5.  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  I  saw her on July 10, 2019 and since her previous evaluation she had  developed Covid in November and was quarantined for 3 weeks.  Her symptoms included fevers chills fatigue and loss of appetite.  At that time she denied any chest pain.  Her last echo Doppler study in November 2019 showed an EF of 60 to 65% with grade 1 diastolic dysfunction.  She continued to use CPAP with 100% compliance.  A download was obtained her 95th percentile CPAP pressure is 16.3 with a maximum average pressure of 17.8.  She  denies any PND orthopnea.  There is no chest pain.   During her evaluation, I recommended she undergo a follow-up echo Doppler study particularly in follow-up of her Covid infection to make certain there was no reduction in LV function.  With her previously documented excellent systolic function, her digoxin was discontinued.  With her diabetes I also discussed Jardiance as a consideration for treatment.  She underwent an echo Doppler study on August 08, 2019.  EF was now reduced at 40 to 45%.  There was grade 2 diastolic dysfunction and abnormal tissue Doppler suggesting elevated left atrial pressure.  There was mild dilation of her ascending aorta at 40 mm.  She was felt to have moderate to severe mitral regurgitation and moderate left atrial dilation.  She was subsequently seen by Dr. Peter Martinique on August 27, 2019 when he was DOD and with her reduced LV function she was transitioned to [facility] 49/51 in place of valsartan and she was started on spironolactone 12.5 mg daily.  It was felt that her MR secondary to reduced LV function and LV dilation.  She has continued to use CPAP.  Her DME company is now [employer].  Download was obtained from March 17 through October 04, 2019 which showed 83% of usage days.  CPAP is set at a minimum pressure of 11 and maximum of 20 with her 95th percentile pressure at 17.9 and maximum average pressure at 19.1.  AHI is 1.0.  She is in need to undergo repeat procedure for her papilloma.    I last saw her in December 10 2019.  At that time she denied any chest pain or palpitations but continued to experience decreased energy felt sluggish.   She continues to go undergo laser treatment every 4 months for her vocal cord papilloma.  She had recently seen Dr. Marlou Sa who checks laboratory.  She continues to use CPAP with 100% compliance and does not sleep without it.    I recommended Jamie Ayers have a follow-up echo Doppler study since initiation of Entresto and this was  done on January 01 2020.  This now showed normalization of LV function with EF back to 55 to 60%.  There was mild LVH and grade 1 diastolic dysfunction.  She had mild mitral regurgitation and mild to moderate aortic regurgitation.  Presently she continues to feel well.  She continues to use CPAP.  She had several days where she was unable to use due to significant water in her hose but ultimately this problem was rectified by [employer].  A download was obtained in the office today 50 except for several days of nonuse when she had this problem compliance was excellent.  AHI is 0.7 with a 95th percentile pressure at 17 and maximum average pressure 18.3.  Of note, laboratory in June disclosed microcytic anemia.  Creatinine at that time was 9.29.  She presents for evaluation.  Past Medical History:  Diagnosis Date  . Anemia   . Anxiety disorder   . Asthmatic bronchitis   . Benign essential HTN 08/06/11   ECHO-EF>55%  . CHF (congestive heart failure) (HCC)    HeFPEF  . COVID-19   . Diabetes mellitus without complication ([place])   . Disorder of vocal cord   . GERD (gastroesophageal reflux disease)   . Hernia   . Hyperlipidemia 03/14/12   Lexiscan myoview-WNL; unchanged from previous study.  . OSA on CPAP 03/28/07   sleep study-[facility]-AHI 9.42/HR and during REM sleep '@37' .50/hr. The average 02 sat duringREM AND NREM was 97.0%  . Thyroid disease   . Upper respiratory infection     Past Surgical History:  Procedure Laterality Date  . ABDOMINAL HYSTERECTOMY  2013   TAH BSO  . BREAST EXCISIONAL BIOPSY Left 2017   papilloma  . BREAST RECONSTRUCTION Bilateral    Patient had fat pads removed from both sides   . BREAST SURGERY     Papilloma  . DILATION AND CURETTAGE OF UTERUS    . ENDOMETRIAL BIOPSY    . HERNIA REPAIR    . HYSTEROSCOPY    . KNEE SURGERY     arthroscopic  . NISSEN FUNDOPLICATION    . OOPHORECTOMY     BSO  . Parathyroid surg    . Vocal Cord Surg      Allergies    Allergen Reactions  . Midazolam Other (See Comments)    REACTION: cardiac arrest  . Midazolam Hcl     Cardiac arrest    VERSED  . Codeine Hives and Itching  . Morphine And Related Hives  . Paba Derivatives Nausea Only    PT REPORTS SEVERE NAUSEA AFTER SURGERY, DENIES VOMITING  . Sulfonamide Derivatives Hives and Itching  . Sulfamethoxazole Hives    Current Outpatient Medications  Medication Sig Dispense Refill  . albuterol (PROVENTIL HFA;VENTOLIN HFA) 108 (90 BASE) MCG/ACT inhaler Inhale 2 puffs into the lungs every 4 (four) hours as needed for wheezing. 3.7 g 0  . ascorbic acid (VITAMIN C) 500 MG tablet Take by mouth.    Marland Kitchen aspirin 81 MG EC tablet aspirin 81 mg tablet,delayed release  Take 1 tablet every day by oral route.    Marland Kitchen atorvastatin (LIPITOR) 40 MG tablet Take 1 tablet (40 mg total) by mouth daily. 90 tablet 3  . budesonide (PULMICORT) 0.5 MG/2ML nebulizer solution Inhale into the lungs.    . Calcium Carbonate-Vitamin D (CALCIUM + D PO) Take 1 tablet by mouth daily.     . calcium-vitamin D (OSCAL WITH D) 500-200 MG-UNIT TABS tablet Take by mouth.    Marland Kitchen  carvedilol (COREG CR) 40 MG 24 hr capsule TAKE 1 CAPSULE BY MOUTH EVERY DAY IN THE EVENING 90 capsule 3  . cycloSPORINE (RESTASIS) 0.05 % ophthalmic emulsion Place 1 drop into both eyes daily.    . diclofenac sodium (VOLTAREN) 1 % GEL diclofenac 1 % topical gel  APPLY 2 GRAMS TOPICALLY TO AFFECTED AREA THREE TIMES DAILY AS NEEDED    . Empagliflozin (JARDIANCE PO) Take by mouth.    . esomeprazole (NEXIUM) 40 MG capsule Take 1 capsule (40 mg total) by mouth 2 (two) times daily before a meal. 180 capsule 1  . fluconazole (DIFLUCAN) 150 MG tablet Take 1 tablet (150 mg total) by mouth every 3 (three) days as needed for up to 6 doses. 6 tablet 0  . furosemide (LASIX) 20 MG tablet Take 1 tablet (20 mg total) by mouth daily. 90 tablet 3  . glipiZIDE (GLUCOTROL XL) 5 MG 24 hr tablet glipizide ER 5 mg tablet, extended release 24 hr  TAKE  1 TABLET BY MOUTH ONCE DAILY    . metFORMIN (GLUCOPHAGE) 500 MG tablet Take by mouth 2 (two) times daily with a meal.    . montelukast (SINGULAIR) 10 MG tablet Take 10 mg by mouth at bedtime.    . Multiple Vitamin (MULTIVITAMIN) tablet Take 1 tablet by mouth daily.      . Multiple Vitamins-Minerals (CENTRUM SILVER) tablet Take by mouth.    . nitrofurantoin (MACRODANTIN) 50 MG capsule Take by mouth.    . NON FORMULARY CPAP THERAPY    . NONFORMULARY OR COMPOUNDED ITEM Boric acid vaginal suppositories 600 mg one per vagina as needed for yeast 30 each 0  . nystatin-triamcinolone (MYCOLOG II) cream Apply 1 application topically 2 (two) times daily. 30 g 1  . omega-3 acid ethyl esters (LOVAZA) 1 g capsule Take 2 capsules (2 g total) by mouth 2 (two) times daily. 360 capsule 3  . Omega-3 Fatty Acids (FISH OIL) 1000 MG CAPS Take by mouth.    . oxyCODONE-acetaminophen (PERCOCET/ROXICET) 5-325 MG tablet TAKE 1 TABLET BY MOUTH TWICE DAILY AS NEEDED FOR PAIN    . phenazopyridine (PYRIDIUM) 200 MG tablet Take 1 tablet (200 mg total) by mouth 3 (three) times daily as needed for pain. Bladder pain 10 tablet 0  . polyethylene glycol (MIRALAX / GLYCOLAX) packet Take 17 g by mouth daily.    . QUEtiapine (SEROQUEL) 25 MG tablet quetiapine 25 mg tablet  TAKE 1 TABLET BY MOUTH AT NIGHT AS DIRECTED    . sacubitril-valsartan (ENTRESTO) 49-51 MG Take 1 tablet by mouth 2 (two) times daily. 60 tablet   . spironolactone (ALDACTONE) 25 MG tablet Take 1/2 tablet ( 12.5 mg ) daily 45 tablet 3  . zolpidem (AMBIEN) 10 MG tablet Take 1 tablet (10 mg total) by mouth at bedtime as needed. For sleep 30 tablet 3  . gabapentin (NEURONTIN) 300 MG capsule Take by mouth.     No current facility-administered medications for this visit.    Social History   Socioeconomic History  . Marital status: Married    Spouse name: Not on file  . Number of children: Not on file  . Years of education: Not on file  . Highest education level:  Not on file  Occupational History  . Not on file  Tobacco Use  . Smoking status: Never Smoker  . Smokeless tobacco: Never Used  Vaping Use  . Vaping Use: Never used  Substance and Sexual Activity  . Alcohol use: No  Alcohol/week: 0.0 standard drinks  . Drug use: No  . Sexual activity: Yes    Birth control/protection: Post-menopausal, Surgical    Comment: HYST-1st intercourse 74 yo-Fewer than 5 partners,DES NEG  Other Topics Concern  . Not on file  Social History Narrative  . Not on file   Social Determinants of Health   Financial Resource Strain:   . Difficulty of Paying Living Expenses: Not on file  Food Insecurity:   . Worried About Charity fundraiser in the Last Year: Not on file  . Ran Out of Food in the Last Year: Not on file  Transportation Needs:   . Lack of Transportation (Medical): Not on file  . Lack of Transportation (Non-Medical): Not on file  Physical Activity:   . Days of Exercise per Week: Not on file  . Minutes of Exercise per Session: Not on file  Stress:   . Feeling of Stress : Not on file  Social Connections:   . Frequency of Communication with Friends and Family: Not on file  . Frequency of Social Gatherings with Friends and Family: Not on file  . Attends Religious Services: Not on file  . Active Member of Clubs or Organizations: Not on file  . Attends Archivist Meetings: Not on file  . Marital Status: Not on file  Intimate Partner Violence:   . Fear of Current or Ex-Partner: Not on file  . Emotionally Abused: Not on file  . Physically Abused: Not on file  . Sexually Abused: Not on file    Family History  Problem Relation Age of Onset  . Diabetes Father   . Hypertension Father   . Cancer Father        Prostate cancer  . Arthritis Father   . Gout Father   . Hypertension Mother   . Stroke Mother   . Breast cancer Maternal Grandmother        Age 96's  . Ovarian cancer Paternal Grandmother   . Breast cancer Sister        Age  46  . Kidney disease Sister    Social history is notable in that she is married has 4 children 9 grandchildren. There is no tobacco or alcohol use.  ROS General: Negative; No fevers, chills, or night sweats; positive for fatigue HEENT: Occasional hoarseness.  In the past she has had difficulty with vocal cord papillomas. No changes in vision or hearing, sinus congestion; papillomas on her vocal cord Pulmonary: Negative; No cough, wheezing, shortness of breath, hemoptysis Cardiovascular: See HPI GI: Negative; No nausea, vomiting, diarrhea, or abdominal pain GU: Negative; No dysuria, hematuria, or difficulty voiding Musculoskeletal: Positive for mild myalgias; spinal stenosis Hematologic/Oncology: Mild microcytic anemia Endocrine: Positive for diabetes mellitus.  No cold or heat intolerance. Neuro: Negative; no changes in balance, headaches Skin: Negative; No rashes or skin lesions Psychiatric: Negative; No behavioral problems, depression Sleep: Positive for sleep apnea; she continues to be on CPAP.  No residual snoring, daytime sleepiness, hypersomnolence, bruxism, restless legs, hypnogognic hallucinations, no cataplexy Other comprehensive 14 point system review is negative.   PE BP 130/74   Pulse 64   Ht '5\' 5"'  (1.651 m)   Wt 209 lb (94.8 kg)   SpO2 97%   BMI 34.78 kg/m    Repeat blood pressure by me 114/68 supine and 118/70 standing  Wt Readings from Last 3 Encounters:  04/15/20 209 lb (94.8 kg)  12/10/19 210 lb 3.2 oz (95.3 kg)  10/05/19 203 lb 3.2  oz (92.2 kg)   General: Alert, oriented, no distress.  Skin: normal turgor, no rashes, warm and dry HEENT: Normocephalic, atraumatic. Pupils equal round and reactive to light; sclera anicteric; extraocular muscles intact;  Nose without nasal septal hypertrophy Mouth/Parynx benign; Mallinpatti scale 3/4 Neck: No JVD, no carotid bruits; normal carotid upstroke Lungs: clear to ausculatation and percussion; no wheezing or  rales Chest wall: without tenderness to palpitation Heart: PMI not displaced, RRR, s1 s2 normal, 1/6 systolic murmur, no diastolic murmur, no rubs, gallops, thrills, or heaves Abdomen: soft, nontender; no hepatosplenomehaly, BS+; abdominal aorta nontender and not dilated by palpation. Back: no CVA tenderness Pulses 2+ Musculoskeletal: full range of motion, normal strength, no joint deformities Extremities: no clubbing cyanosis or edema, Homan's sign negative  Neurologic: grossly nonfocal; Cranial nerves grossly wnl Psychologic: Normal mood and affect   ECG (independently read by me): NSR at 64; LVH; normal intervals  June 21, 2021ECG (independently read by me): NSR 60; LVH , no ectopy, normal interals  October 05, 2019 ECG (independently read by me): Sinus rhythm at 72 bpm, LVH by voltage criteria in aVL.  No ectopy.  Normal intervals.  January 2021 ECG (independently read by me): Normal sinus rhythm at 62 bpm.  Borderline first-degree block with a PR normal at 202 ms.  No ectopy.  Normal QTc interval at 381 ms  December 2019 ECG (independently read by me): Normal sinus rhythm at 66 bpm.  First-degree AV block with appeared normal to 10 ms.  LVH by voltage criteria.  No ectopy.  October 2019 ECG (independently read by me): Normal sinus rhythm at 70 bpm.  LVH by voltage criteria probable left atrial enlargement.  Non-specific ST changes.  September 2018 ECG (independently read by me): Normal sinus rhythm at 64 bpm.  First-degree AV block.  PR interval 218 ms.  LVH by voltage criteria in aVL.  January 2018 ECG (independently read by me): Sinus bradycardia with first-degree AV block.  LVH by voltage criteria.  No significant ST-T changes.  October 2017 ECG (independently read by me): Normal sinus rhythm at 61 bpm.  LVH by voltage.  Borderline first-degree AV block with PR interval 206 ms.  April 2017 ECG (independently read by me): Normal sinus rhythm at 64 bpm.  First-degree AV block with a  PR interval of 218 ms.  ECG (independently read by me): Normal sinus rhythm at 68 bpm.  LVH by voltage in aVL.  July 2016 ECG (independently read by me): Normal sinus rhythm at 64 bpm..  Borderline LVH.  No cigarette ST changes.  August 2015 ECG (independently read by me): Normal sinus rhythm at 67 beats per minute.  Moderate voltage criteria for LVH.  No significant ST changes.  Borderline first degree AV block with a PR interval at 208 ms.  10/08/2013 ECG (independently read by me): Sinus rhythm at 59 beats per minute.  Borderline first-degree block with PR interval of 220 ms.  QTc interval normal.  Prior 04/17/2013 ECG: Normal sinus rhythm at 62 beats per minute. Borderline first degree AV block is no longer present with her PR interval now at 184 ms.  LABS: BMP Latest Ref Rng & Units 04/15/2020 12/12/2019 08/31/2019  Glucose 65 - 99 mg/dL 95 107(H) 137(H)  BUN 8 - 27 mg/dL 29(H) 33(H) 29(H)  Creatinine 0.57 - 1.00 mg/dL 1.15(H) 1.29(H) 0.98  BUN/Creat Ratio 12 - '28 25 26 ' 30(H)  Sodium 134 - 144 mmol/L 140 140 143  Potassium 3.5 - 5.2 mmol/L 4.9  4.4 4.6  Chloride 96 - 106 mmol/L 101 99 104  CO2 20 - 29 mmol/L '25 23 23  ' Calcium 8.7 - 10.3 mg/dL 10.1 10.2 10.6(H)    Hepatic Function Latest Ref Rng & Units 12/12/2019 06/15/2016 04/05/2016  Total Protein 6.0 - 8.5 g/dL 7.4 7.3 7.7  Albumin 3.7 - 4.7 g/dL 4.1 4.4 4.4  AST 0 - 40 IU/L '15 17 15  ' ALT 0 - 32 IU/L '11 15 14  ' Alk Phosphatase 48 - 121 IU/L 106 83 87  Total Bilirubin 0.0 - 1.2 mg/dL 0.4 1.3(H) 0.7   CBC Latest Ref Rng & Units 04/15/2020 12/12/2019 06/15/2016  WBC 3.4 - 10.8 x10E3/uL 10.5 8.7 20.2(A)  Hemoglobin 11.1 - 15.9 g/dL 9.8(L) 9.6(L) 11.4(A)  Hematocrit 34.0 - 46.6 % 31.8(L) 31.2(L) 34.7(A)  Platelets 150 - 450 x10E3/uL 310 308 230   Lab Results  Component Value Date   MCV 74 (L) 04/15/2020   MCV 73 (L) 12/12/2019   MCV 72.8 (A) 06/15/2016   Lab Results  Component Value Date   TSH 1.640 12/12/2019   Lab  Results  Component Value Date   HGBA1C 7.1 (H) 12/12/2019   Lipid Panel     Component Value Date/Time   CHOL 184 12/12/2019 1135   TRIG 105 12/12/2019 1135   HDL 46 12/12/2019 1135   CHOLHDL 4.0 12/12/2019 1135   CHOLHDL 4.9 07/14/2016 1031   VLDL 37 (H) 07/14/2016 1031   LDLCALC 119 (H) 12/12/2019 1135    RADIOLOGY: No results found.  IMPRESSION: 1. Chronic combined systolic and diastolic CHF, NYHA class 2 ([place])   2. Essential hypertension   3. OSA on CPAP   4. Mixed hyperlipidemia   5. Microcytic anemia   6. Medication management   7. Type 2 diabetes mellitus with complication, without long-term current use of insulin ([place])     ASSESSMENT AND PLAN: Ms. Toohey is a 74 year-old African-American female who has a history of morbid obesity and a history of a nonischemic cardiomyopathy with an ejection fraction of 20% initially documented in 2005 with subsequent normalization. Her nuclear perfusion study in September 2013 showed normal perfusion without scar or ischemia. An echo Doppler study in August 2016 demonstrated normal systolic function with an EF of 60 to 60% and there was grade 1 diastolic dysfunction.  There was mild AR and mild MR.  Her left atrium was mildly dilated.   Her echo Doppler study in November 2019 showed EF at 60 to 65% with grade 1 diastolic dysfunction and mild aortic insufficiency.  As recently, she has been on a medical regimen consisting of long-acting carvedilol at 40 mg daily, furosemide 40 mg, and  digoxin 0.125 mg.  With her excellent LV function, I recommended discontinuance of digoxin in January 2021.  With her recent Covid injection I recommended a follow-up echo Doppler study which demonstrated an EF reduced at 40 to 45% and there was evidence for moderately severe mitral regurgitation and grade 2 diastolic dysfunction.  Her ARB therapy has been transitioned to Entresto 49/51 mg twice a day and spironolactone was also added to her regimen.  On  subsequent evaluation, Jamie Ayers was attempted to be titrated to 97/103 mg twice a day but due to [place] blood pressure she was advised to resume the 49/51 mg twice daily.  Her most recent echo Doppler study since initiation of Entresto done on January 09 2020 now shows normalization of LV function with EF 55 to 60%, mild LVH, grade 1 diastolic  dysfunction, mild MR, and mild to moderate aortic insufficiency.  Her blood pressure today is stable without orthostatic change.  In addition to [facility] she continues to be on spironolactone 12.5 mg daily, and carvedilol long-acting 40 mg daily.  she denies significant swelling.  She continues to use CPAP with excellent compliance.  For several days she was having water in her house and she did not use therapy.  This was rectified by adapt.  AHI is excellent and she continues to have a pressure range set at 11-20 but hemoglobin 9.6, hematocrit 31.2, MCV of 73.  Creatinine was 1.29.  She denies any dark stools.  She sees Dr. Marlou Sa for primary care.  I have suggested a follow-up CBC with iron, TIBC, and ferritin levels as well as a follow-up bmet today for reassessment.  She continues to be atorvastatin 40 mg and Lovaza 2 capsules twice a day for mixed hyperlipidemia.  She is diabetic on Jardiance in addition to Metformin.  She will continue therapy.  I will see her in 6 months for reevaluation or sooner as needed.  Jamie Sine, MD, [facility]  04/16/2020 8:27 AM

## 2020-04-16 ENCOUNTER — Encounter: Payer: Self-pay | Admitting: Cardiovascular Disease

## 2020-04-16 LAB — CBC
Hematocrit: 31.8 % — ABNORMAL LOW (ref 34.0–46.6)
Hemoglobin: 9.8 g/dL — ABNORMAL LOW (ref 11.1–15.9)
MCH: 22.8 pg — ABNORMAL LOW (ref 26.6–33.0)
MCHC: 30.8 g/dL — ABNORMAL LOW (ref 31.5–35.7)
MCV: 74 fL — ABNORMAL LOW (ref 79–97)
Platelets: 310 10*3/uL (ref 150–450)
RBC: 4.29 x10E6/uL (ref 3.77–5.28)
RDW: 15.7 % — ABNORMAL HIGH (ref 11.7–15.4)
WBC: 10.5 10*3/uL (ref 3.4–10.8)

## 2020-04-16 LAB — BASIC METABOLIC PANEL
BUN/Creatinine Ratio: 25 (ref 12–28)
BUN: 29 mg/dL — ABNORMAL HIGH (ref 8–27)
CO2: 25 mmol/L (ref 20–29)
Calcium: 10.1 mg/dL (ref 8.7–10.3)
Chloride: 101 mmol/L (ref 96–106)
Creatinine, Ser: 1.15 mg/dL — ABNORMAL HIGH (ref 0.57–1.00)
GFR calc Af Amer: 54 mL/min/{1.73_m2} — ABNORMAL LOW (ref >59)
GFR calc non Af Amer: 47 mL/min/{1.73_m2} — ABNORMAL LOW (ref >59)
Glucose: 95 mg/dL (ref 65–99)
Potassium: 4.9 mmol/L (ref 3.5–5.2)
Sodium: 140 mmol/L (ref 134–144)

## 2020-04-16 LAB — IRON AND TIBC
Iron Saturation: 15 % (ref 15–55)
Iron: 39 ug/dL (ref 27–139)
Total Iron Binding Capacity: 267 ug/dL (ref 250–450)
UIBC: 228 ug/dL (ref 118–369)

## 2020-04-16 LAB — FERRITIN: Ferritin: 122 ng/mL (ref 15–150)

## 2020-05-08 ENCOUNTER — Other Ambulatory Visit: Payer: Self-pay | Admitting: Cardiovascular Disease

## 2020-05-26 ENCOUNTER — Telehealth: Payer: Self-pay

## 2020-05-26 NOTE — Telephone Encounter (Signed)
Pt called today concerned as to why she keeps receiving a bill for 70 dollars. After speaking with Jocelyn Lamer from Billing two charges were entered in as an error for 2 surgical shoes when the patient only received one. I explain to the patient that Medicare and [employer] doesn't cover surgical shoes. The patient was very upset and stated that this doesn't happen at [facility]. I update the pt that her only charge would be 35 dollars for that one surgical shoes and explain again that medicare does not cover DMEs.

## 2020-08-06 ENCOUNTER — Other Ambulatory Visit: Payer: Self-pay

## 2020-08-06 ENCOUNTER — Encounter: Payer: Self-pay | Admitting: Obstetrics and Gynecology

## 2020-08-06 ENCOUNTER — Ambulatory Visit (INDEPENDENT_AMBULATORY_CARE_PROVIDER_SITE_OTHER): Payer: Medicare Other | Admitting: Obstetrics and Gynecology

## 2020-08-06 ENCOUNTER — Telehealth: Payer: Self-pay | Admitting: *Deleted

## 2020-08-06 VITALS — BP 132/76 | HR 88 | Temp 98.0°F | Resp 22 | Ht 62.4 in | Wt 213.4 lb

## 2020-08-06 DIAGNOSIS — Z01419 Encounter for gynecological examination (general) (routine) without abnormal findings: Secondary | ICD-10-CM

## 2020-08-06 DIAGNOSIS — Z803 Family history of malignant neoplasm of breast: Secondary | ICD-10-CM

## 2020-08-06 DIAGNOSIS — N952 Postmenopausal atrophic vaginitis: Secondary | ICD-10-CM | POA: Diagnosis not present

## 2020-08-06 DIAGNOSIS — R350 Frequency of micturition: Secondary | ICD-10-CM | POA: Diagnosis not present

## 2020-08-06 DIAGNOSIS — N898 Other specified noninflammatory disorders of vagina: Secondary | ICD-10-CM

## 2020-08-06 MED ORDER — NONFORMULARY OR COMPOUNDED ITEM
0 refills | Status: DC
Start: 2020-08-06 — End: 2020-08-06

## 2020-08-06 MED ORDER — NONFORMULARY OR COMPOUNDED ITEM
0 refills | Status: DC
Start: 2020-08-06 — End: 2021-03-12

## 2020-08-06 MED ORDER — FLUCONAZOLE 150 MG PO TABS: 150.0000 mg | ORAL_TABLET | ORAL | 0 refills | Status: DC | PRN

## 2020-08-06 NOTE — Progress Notes (Addendum)
Jamie Ayers 1946-03-14 [id number]  SUBJECTIVE:  75 y.o. G4P4004 female for annual routine breast and pelvic exam. Noting some urinary frequency in the past week.  No fever/chills/flank pain.  Also feels she has vaginal odor letter how much she cleanses the vaginal area with soap.  Has no vaginal itching as well.  Has stopped using boric acid capsules as a few weeks ago since she ran out.   Current Outpatient Medications  Medication Sig Dispense Refill  . albuterol (PROVENTIL HFA;VENTOLIN HFA) 108 (90 BASE) MCG/ACT inhaler Inhale 2 puffs into the lungs every 4 (four) hours as needed for wheezing. 3.7 g 0  . ascorbic acid (VITAMIN C) 500 MG tablet Take by mouth.    Marland Kitchen aspirin 81 MG EC tablet aspirin 81 mg tablet,delayed release  Take 1 tablet every day by oral route.    Marland Kitchen atorvastatin (LIPITOR) 40 MG tablet Take 1 tablet (40 mg total) by mouth daily. 90 tablet 3  . budesonide (PULMICORT) 0.5 MG/2ML nebulizer solution Inhale into the lungs.    . Calcium Carbonate-Vitamin D (CALCIUM + D PO) Take 1 tablet by mouth daily.     . calcium-vitamin D (OSCAL WITH D) 500-200 MG-UNIT TABS tablet Take by mouth.    . carvedilol (COREG CR) 40 MG 24 hr capsule TAKE 1 CAPSULE BY MOUTH EVERY DAY IN THE EVENING 90 capsule 3  . cycloSPORINE (RESTASIS) 0.05 % ophthalmic emulsion Place 1 drop into both eyes daily.    . diclofenac sodium (VOLTAREN) 1 % GEL diclofenac 1 % topical gel  APPLY 2 GRAMS TOPICALLY TO AFFECTED AREA THREE TIMES DAILY AS NEEDED    . Empagliflozin (JARDIANCE PO) Take by mouth.    . fluconazole (DIFLUCAN) 150 MG tablet Take 1 tablet (150 mg total) by mouth every 3 (three) days as needed for up to 6 doses. 6 tablet 0  . furosemide (LASIX) 20 MG tablet Take 1 tablet (20 mg total) by mouth daily. 90 tablet 3  . gabapentin (NEURONTIN) 300 MG capsule Take by mouth.    Marland Kitchen glipiZIDE (GLUCOTROL XL) 5 MG 24 hr tablet glipizide ER 5 mg tablet, extended release 24 hr  TAKE 1 TABLET BY MOUTH ONCE  DAILY    . metFORMIN (GLUCOPHAGE) 500 MG tablet Take by mouth 2 (two) times daily with a meal.    . montelukast (SINGULAIR) 10 MG tablet Take 10 mg by mouth at bedtime.    . Multiple Vitamin (MULTIVITAMIN) tablet Take 1 tablet by mouth daily.      . Multiple Vitamins-Minerals (CENTRUM SILVER) tablet Take by mouth.    Marland Kitchen NEXIUM 40 MG capsule TAKE ONE CAPSULE BY MOUTH TWICE A DAY BEFORE MEALS 180 capsule 1  . nitrofurantoin (MACRODANTIN) 50 MG capsule Take by mouth.    . NON FORMULARY CPAP THERAPY    . NONFORMULARY OR COMPOUNDED ITEM Boric acid vaginal suppositories 600 mg one per vagina as needed for yeast 30 each 0  . nystatin-triamcinolone (MYCOLOG II) cream Apply 1 application topically 2 (two) times daily. 30 g 1  . omega-3 acid ethyl esters (LOVAZA) 1 g capsule Take 2 capsules (2 g total) by mouth 2 (two) times daily. 360 capsule 3  . Omega-3 Fatty Acids (FISH OIL) 1000 MG CAPS Take by mouth.    . oxyCODONE-acetaminophen (PERCOCET/ROXICET) 5-325 MG tablet TAKE 1 TABLET BY MOUTH TWICE DAILY AS NEEDED FOR PAIN    . phenazopyridine (PYRIDIUM) 200 MG tablet Take 1 tablet (200 mg total) by mouth 3 (three)  times daily as needed for pain. Bladder pain 10 tablet 0  . polyethylene glycol (MIRALAX / GLYCOLAX) packet Take 17 g by mouth daily.    . QUEtiapine (SEROQUEL) 25 MG tablet quetiapine 25 mg tablet  TAKE 1 TABLET BY MOUTH AT NIGHT AS DIRECTED    . sacubitril-valsartan (ENTRESTO) 49-51 MG Take 1 tablet by mouth 2 (two) times daily. 60 tablet   . spironolactone (ALDACTONE) 25 MG tablet Take 1/2 tablet ( 12.5 mg ) daily 45 tablet 3  . zolpidem (AMBIEN) 10 MG tablet Take 1 tablet (10 mg total) by mouth at bedtime as needed. For sleep 30 tablet 3   No current facility-administered medications for this visit.   Allergies: Midazolam, Midazolam hcl, Codeine, Morphine and related, Paba derivatives, Sulfonamide derivatives, and Sulfamethoxazole  No LMP recorded. Patient is postmenopausal.  Past  medical history,surgical history, problem list, medications, allergies, family history and social history were all reviewed and documented as reviewed in the EPIC chart.  ROS: Pertinent positives and negatives as reviewed in HPI   OBJECTIVE:  BP 132/76 (BP Location: Right Arm, Patient Position: Sitting)   Pulse 88   Temp 98 F (36.7 C) (Oral)   Resp (!) 22   Ht 5' 2.4" (1.585 m)   Wt 213 lb 6.4 oz (96.8 kg)   BMI 38.53 kg/m  The patient appears well, alert, oriented, in no distress.  BREAST EXAM: breasts appear normal, symmetrically no suspicious masses, no skin or nipple changes or axillary nodes  PELVIC EXAM: VULVA: normal appearing vulva with no masses, tenderness or lesions, atrophic changes, VAGINA: normal appearing vagina with normal color and discharge, no lesions, CERVIX: normal appearing cervix without discharge or lesions, UTERUS: uterus is normal size, shape, consistency and nontender, ADNEXA: normal adnexa in size, nontender and no masses, RECTAL: not done  Urinalysis unremarkable with 6-10 WBC, 3-10 RBC, 0-5 squamous epithelial cells, few bacteria, clear, trace leukocyte esterase, negative nitrate  Chaperone: Glorianne Manchester RN present during the examination   ASSESSMENT:  75 y.o. [id number] here for annual asked and pelvic exam  PLAN:   1. Postmenopausal/atrophic vaginitis. Discussed that vulvovaginal atrophy can certainly lead to vaginal pruritus and lack of estrogen can change vaginal pH leading to different odor.  No abnormalities noted on the exam today, no vaginal discharge noted.  Localized vaginal estrogen therapy would potentially be an option but she is concerned about risks of cancer and declines. History of recurrent yeast infections in the past, which she uses fluconazole 150 mg as needed as she has done for a long time.  She usually refills with #6 after these visits so we did the same today.  We also refilled her vaginal boric acid capsules and will have staff call  in that [facility].  Previous TAH BSO for leiomyoma.  2. Urinary frequency.  Analysis with low suspicion for UTI.  I suggested that she start back on the vaginal boric acid capsules and to see how her symptoms progress.  I also think vaginal estrogen would be helpful for this symptom but she again declines as above.  We will follow up on the urine culture. 3. Pap smear 2016.  No history of abnormal Pap smears.  She is comfortable with no longer screening following the current screening guidelines which we again reviewed today.   4. Mammogram 09/2019.  Normal breast exam today.  Right-sided mastalgia was evaluated last year with diagnostic mammogram with no findings.   Symptoms are less bothersome to  her now.  She does have a strong family history of breast cancer and questionable ovarian cancer.  Had discussed and recommended genetic counseling previously.  Patient decided against this and did not want referral.   Continue with annual mammograms, and I suggested going through with a regular screening mammogram this year per the radiology recommendation from report from last year's diagnostic scan. 5. Colonoscopy 2014.  Recommended that she follow up at the recommended interval.   6. DEXA 08/2019 was normal.  Repeat at 5-year interval. 7. Health maintenance.  No labs today as she normally has these completed elsewhere. The patient is aware that I will only be at this practice until early March 2022 so she knows to make sure she requests any needed follow-up when I am no longer at the practice.   Return annually or sooner, prn.  Joseph Pierini MD  08/06/20

## 2020-08-06 NOTE — Telephone Encounter (Signed)
-----   Message from Joseph Pierini, MD sent at 08/06/2020  2:46 PM EST ----- Regarding: vaginal boric acid capsules Please call in rx for vaginal boric acid capsules

## 2020-08-06 NOTE — Telephone Encounter (Signed)
Rx called in 

## 2020-08-08 ENCOUNTER — Encounter: Payer: Self-pay | Admitting: Obstetrics and Gynecology

## 2020-08-08 LAB — URINE CULTURE
MICRO NUMBER:: 11541769
SPECIMEN QUALITY:: ADEQUATE

## 2020-08-08 LAB — URINALYSIS, COMPLETE W/RFL CULTURE
Bilirubin Urine: NEGATIVE
Glucose, UA: NEGATIVE
Hyaline Cast: NONE SEEN /LPF
Ketones, ur: NEGATIVE
Nitrites, Initial: NEGATIVE
Specific Gravity, Urine: 1.015 (ref 1.001–1.03)
pH: 6.5 (ref 5.0–8.0)

## 2020-08-08 LAB — CULTURE INDICATED

## 2020-08-21 ENCOUNTER — Other Ambulatory Visit: Payer: Self-pay | Admitting: Obstetrics and Gynecology

## 2020-08-21 DIAGNOSIS — Z1231 Encounter for screening mammogram for malignant neoplasm of breast: Secondary | ICD-10-CM

## 2020-09-03 ENCOUNTER — Telehealth: Payer: Self-pay | Admitting: Cardiovascular Disease

## 2020-09-03 MED ORDER — ATORVASTATIN CALCIUM 40 MG PO TABS: 40.0000 mg | ORAL_TABLET | Freq: Every day | ORAL | 3 refills | Status: DC

## 2020-09-03 MED ORDER — FUROSEMIDE 20 MG PO TABS: 20.0000 mg | ORAL_TABLET | Freq: Every day | ORAL | 3 refills | Status: AC

## 2020-09-03 MED ORDER — SPIRONOLACTONE 25 MG PO TABS: ORAL_TABLET | ORAL | 3 refills | Status: DC

## 2020-09-03 MED ORDER — SACUBITRIL-VALSARTAN 49-51 MG PO TABS: 1.0000 | ORAL_TABLET | Freq: Two times a day (BID) | ORAL | 1 refills | Status: DC

## 2020-09-03 NOTE — Addendum Note (Signed)
Addended by: Venetia Maxon on: 09/03/2020 04:21 PM   Modules accepted: Orders

## 2020-09-03 NOTE — Telephone Encounter (Signed)
*  STAT* If patient is at the pharmacy, call can be transferred to refill team.   1. Which medications need to be refilled? (please list name of each medication and dose if known)  sacubitril-valsartan (ENTRESTO) 49-51 MG  2. Which pharmacy/location (including street and city if local pharmacy) is medication to be sent to? [facility] - [place], [address]  3. Do they need a 30 day or 90 day supply? 90 with refills  Patient states that she should have gotten the 97-103 mg dose of the Entresto not the 49-51 mg dose. Please correct

## 2020-09-03 NOTE — Telephone Encounter (Signed)
*  STAT* If patient is at the pharmacy, call can be transferred to refill team.   1. Which medications need to be refilled? (please list name of each medication and dose if known)  spironolactone (ALDACTONE) 25 MG tablet NEXIUM 40 MG capsule furosemide (LASIX) 20 MG tablet sacubitril-valsartan (ENTRESTO) 49-51 MG atorvastatin (LIPITOR) 40 MG tablet   2. Which pharmacy/location (including street and city if local pharmacy) is medication to be sent to? [facility] - [place], [address]  3. Do they need a 30 day or 90 day supply? 90 day   Patient is out of medication

## 2020-09-17 ENCOUNTER — Telehealth: Payer: Self-pay | Admitting: Cardiovascular Disease

## 2020-09-17 NOTE — Telephone Encounter (Signed)
Pt c/o medication issue:  1. Name of Medication: sacubitril-valsartan (ENTRESTO) 49-51 MG  2. How are you currently taking this medication (dosage and times per day)? Take 1 tablet by mouth 2 (two) times daily.  3. Are you having a reaction (difficulty breathing--STAT)? No  4. What is your medication issue? Pt is unsure of how she is supposed to be taking this med. Pt states she is supposed to be taking 97 mg per day Please Advise

## 2020-09-19 NOTE — Telephone Encounter (Signed)
Called patient, she states she has been taking the 97/103 dose- I did advise her of her last visit what was stated regarding her blood pressure and she states that is not correct, patient states she just needs this dose sent to pharmacy- advised I would have to send a message to [facility] to get it changed back, because per his last note he was decreasing it due to her blood pressure issue.  Patient verbalized understanding.

## 2020-09-19 NOTE — Telephone Encounter (Signed)
Reviewed chart note from Dr. Claiborne Billings.  He attempted to increase Entresto to 97/103, but noted low BP readings and advised Jamie Ayers resume 49/51 mg dose (this was back in October 2021).  Jamie Ayers should stay on the 49/51 until Jamie Ayers sees him in July

## 2020-09-26 NOTE — Telephone Encounter (Signed)
Apparently the patient states she never reduced her dose from Entresto to 97/103 249/51 and states she is still taking the higher dose.  If this is correct then then renew her Entresto at the 97 103 dose

## 2020-09-30 MED ORDER — CARVEDILOL PHOSPHATE ER 40 MG PO CP24: ORAL_CAPSULE | ORAL | 3 refills | Status: DC

## 2020-09-30 MED ORDER — SPIRONOLACTONE 25 MG PO TABS: ORAL_TABLET | ORAL | 3 refills | Status: AC

## 2020-09-30 MED ORDER — ENTRESTO 97-103 MG PO TABS: 1.0000 | ORAL_TABLET | Freq: Two times a day (BID) | ORAL | 3 refills | Status: AC

## 2020-09-30 NOTE — Telephone Encounter (Signed)
Pt confirmed again that she has been taking Entresto 97-103 mg BID. Advised that MD is ok to fill higher dose but to continue to monitor BP. Pt also state she need refill for Carvedilol and Spironolactone. New Rx sent to requested pharmacy.

## 2020-10-14 ENCOUNTER — Ambulatory Visit
Admission: RE | Admit: 2020-10-14 | Discharge: 2020-10-14 | Disposition: A | Discharge status: Home / Self Care | Payer: Medicare Other | Source: Ambulatory Visit | Attending: Obstetrics and Gynecology | Admitting: Obstetrics and Gynecology

## 2020-10-14 ENCOUNTER — Other Ambulatory Visit: Payer: Self-pay

## 2020-10-14 DIAGNOSIS — Z1231 Encounter for screening mammogram for malignant neoplasm of breast: Secondary | ICD-10-CM

## 2020-11-03 ENCOUNTER — Ambulatory Visit
Admission: RE | Admit: 2020-11-03 | Discharge: 2020-11-03 | Disposition: A | Discharge status: Home / Self Care | Payer: Medicare Other | Source: Ambulatory Visit | Attending: Internal Medicine | Admitting: Internal Medicine

## 2020-11-03 ENCOUNTER — Other Ambulatory Visit: Payer: Self-pay | Admitting: Internal Medicine

## 2020-11-03 DIAGNOSIS — R059 Cough, unspecified: Secondary | ICD-10-CM

## 2020-11-03 DIAGNOSIS — J189 Pneumonia, unspecified organism: Secondary | ICD-10-CM

## 2020-12-19 ENCOUNTER — Telehealth: Payer: Self-pay

## 2020-12-19 ENCOUNTER — Encounter: Payer: Self-pay | Admitting: Cardiovascular Disease

## 2020-12-19 ENCOUNTER — Other Ambulatory Visit: Payer: Self-pay

## 2020-12-19 ENCOUNTER — Ambulatory Visit (INDEPENDENT_AMBULATORY_CARE_PROVIDER_SITE_OTHER): Payer: Medicare Other | Admitting: Cardiovascular Disease

## 2020-12-19 DIAGNOSIS — E118 Type 2 diabetes mellitus with unspecified complications: Secondary | ICD-10-CM

## 2020-12-19 DIAGNOSIS — G4733 Obstructive sleep apnea (adult) (pediatric): Secondary | ICD-10-CM

## 2020-12-19 DIAGNOSIS — I5042 Chronic combined systolic (congestive) and diastolic (congestive) heart failure: Secondary | ICD-10-CM | POA: Diagnosis not present

## 2020-12-19 DIAGNOSIS — I428 Other cardiomyopathies: Secondary | ICD-10-CM | POA: Diagnosis not present

## 2020-12-19 DIAGNOSIS — E782 Mixed hyperlipidemia: Secondary | ICD-10-CM

## 2020-12-19 DIAGNOSIS — D509 Iron deficiency anemia, unspecified: Secondary | ICD-10-CM

## 2020-12-19 DIAGNOSIS — Z9989 Dependence on other enabling machines and devices: Secondary | ICD-10-CM | POA: Diagnosis not present

## 2020-12-19 DIAGNOSIS — J181 Lobar pneumonia, unspecified organism: Secondary | ICD-10-CM

## 2020-12-19 MED ORDER — ATORVASTATIN CALCIUM 40 MG PO TABS: 40.0000 mg | ORAL_TABLET | Freq: Every day | ORAL | 3 refills | Status: AC

## 2020-12-19 MED ORDER — OMEGA-3-ACID ETHYL ESTERS 1 G PO CAPS: 2.0000 g | ORAL_CAPSULE | Freq: Two times a day (BID) | ORAL | 3 refills | Status: AC

## 2020-12-19 NOTE — Patient Instructions (Signed)
Medication Instructions:  Your physician recommends that you continue on your current medications as directed. Please refer to the Current Medication list given to you today.  *If you need a refill on your cardiac medications before your next appointment, please call your pharmacy*   Lab Work: None ordered.    Testing/Procedures: None ordered.    Follow-Up: At [facility], you and your health needs are our priority.  As part of our continuing mission to provide you with exceptional heart care, we have created designated Provider Care Teams.  These Care Teams include your primary Cardiologist (physician) and Advanced Practice Providers (APPs -  Physician Assistants and Nurse Practitioners) who all work together to provide you with the care you need, when you need it.  We recommend signing up for the patient portal called "MyChart".  Sign up information is provided on this After Visit Summary.  MyChart is used to connect with patients for Virtual Visits (Telemedicine).  Patients are able to view lab/test results, encounter notes, upcoming appointments, etc.  Non-urgent messages can be sent to your provider as well.   To learn more about what you can do with MyChart, go to [email or web address].    Your next appointment:   6 month(s)  The format for your next appointment:   In Person  Provider:   Shelva Majestic, MD

## 2020-12-19 NOTE — Progress Notes (Signed)
Patient ID: Jamie Ayers, female   DOB: July 12, 1945, 75 y.o.   MRN: [id number]     Primary  M.D.: Dr. Kevan Ayers  HPI: Jamie Ayers is a 75 y.o. female who presents to the office for a 9 month cardiology evaluation.  .  Jamie Ayers has a remote history of a nonischemic cardiomyopathy.  In December 2005 her ejection fraction was 20%. With aggressive medical therapy LV function normalized to approximately 55%. She has a history of obstructive sleep apnea and has been utilizing CPAP therapy since 2008. 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 She also has undergone papilloma from a breast duct removal at the time of the hysterectomy.  An echo Doppler study on 03/20/2013 showed an ejection fraction at 50-55% with mild concentric left ventricular hypertrophy. She had normal diastolic function. There was evidence for mild aortic insufficiency and mild mitral regurg patient with a centrally directed jet. Her left atrium was mildly dilated. She recently had repeat laboratory done by Dr. Kevan Ayers.  Laboratory on 10/05/2013 revealed hemoglobin11.6/ hematocrit 36.0.  She did have microcytic indices at 73.5.  She denies any history of sickle cell trait or thalassemia.  She is unaware of blood loss.  Glucose was elevated at 147.  She tells me in the past Dr. Marlou Ayers had tried metformin but she did not tolerate this.  TSH was 1.054.  She is continuing to use CPAP with 100% compliance.  She denies daytime fatigue or awareness of breakthrough snoring.  On 01/28/2015 a follow-up echo Doppler study revealed an ejection fraction at 60-65% without wall motion abnormalities.  There was grade 1 diastolic dysfunction, mild aortic and mild mitral regurgitation.  The left atrium was mildly dilated.  Recently, she has noticed some mild myalgias in her legs.    She was seen by Dr. Percival Ayers in 2017 with complaints of fatigue and  some shortness of breath.  BNP was normal at 12.7.  Blood work by Dr. Marlou Ayers from 03/02/2017revealed  a TSH of 0.65.  She had microcytic indices with an MCV of 73, hemoglobin 12.4.  Her creatinine had risen to 1.58 with a BUN of 44.  Calcium was mildly elevated at 10.9.  Saw her in follow-up one month later.  She continues to use CPAP therapy.  I was able to review a download from 08/13/2015 through 09/11/2015.  She has S9.  AutoSet CPAP ResMed unit, 95th percentile pressure is 15.5.  AHI is excellent at 0.5.  She is compliant with 97% of usage stays averaging approximately 8 hours sleep per night.    Repeat blood work revealed a BUN of 47 and creatinine of 1.24.  Her calcium was mildly increased at 10.8.  Lipid studies were markedly abnormal.  Apparently, she had stopped taking atorvastatin the past 2 months.  Her total cholesterol had risen to 297, triglycerides 303, HDL 39, VLDL 61, and LDL 197.  Her hemoglobin is 11.5 with hematocrit 36.1.  She has had consistent microcytosis.  TSH remained normal at 1.19.    She was hospitalized at [facility] with pancreatitis.  She had developed significant vomiting and nausea, and on 06/17/2016 she was seen at urgent care and was found to have an amylase of 1120 and a lipase of 624.  Her Januvia was stopped as well, as Zetia.   When I saw her in F/U she denied any chest pain or  shortness of  breath.  She has noticed some difficulty with her legs and feet.  She notes cramping when she walks but at times note some muscle aches, not exertionally.  She also has noticed a vague nonexertional short-lived chest sensation which resolved spontaneously.  She does note some occasional ankle swelling.  She has been using CPAP with 100% compliance and can't sleep without it.    She received a new CPAP machine on June 13, 2017.  A download was obtained in the office today from February 10 - August 29, 2017.  She is meeting Medicare compliance standards with 90% of usage days.  Usage  greater than 4 hours was 26 days or 87%.  Days that she did not use her machine was when she had received a soap clean machine and not realize when she was out of town the reservoir requirement.  She is averaging 8 hours and 30 minutes of CPAP use per night.  She has been set on a auto CPAP minimum pressure of 8 and a maximum of 20.  AHI is 0.6.  There is no significant leak.  She is requiring high pressures with 95th percentile pressure at 18.2 with maximum average pressure at 19.2.  She is unaware of breakthrough snoring.  Her sleep is restorative.  She continues to experience times sleepiness and Epworth sleepiness scale score was endorsed  and calculated at 18.  She has difficulty with sleep initiation.  She often goes to bed at 11 PM but cannot fall asleep for several hours and at times is still awake at 2 AM.  She has a peripheral neuropathy.    When seen  in March 2019 she was having more fatigue and a download of her CPAP unit indicated that she was requiring very high pressures.  As result I changed her CPAP minimum pressure from 8 up to 11 cm of water pressure with a maximum of 20.  She had  called our office with complaints of increasing fatigue and shortness of breath with exertion.  She denies chest tightness fever chills or night sweats.  She was worked into my office on April 13, 2018 and admitted to illness in her feet, paresthesias from neuropathy and was having issues with back discomfort and lumbar disc disease.  She was having issues with spinal stenosis and had received a short course of prednisone therapy.  She admitted to exertional dyspnea and palpitations.  She was using CPAP with 100% compliance.   She underwent an echo Doppler study on April 27, 2018 which continued to show normal LV function with an EF of 60 to 65%.  There was grade 1 diastolic dysfunction and mild aortic regurgitation.  Lower extremity Doppler study with ABIs that were similar to her prior study 1 year  previously.  ABIs were within normal limits.  She has continued to use CPAP with 100% compliance.  A download was obtained in the office today from November 2 through May 21, 2018.  She has an air sense 10 AutoSet unit with a minimum pressure at 11 with maximum up to 20.  Her 95th percentile pressure is 18.7 with a maximum pressure of 19.4.  AHI is excellent at 0.6.    When I saw her in December 2019 I reviewed her lower extremity Doppler assessment which was unchanged from previously and she had normal ABIs.  Her blood pressure was stable on Coreg SR 40 mg, furosemide 40 mg, valsartan 160 mg.  At that time I discussed the possibility of  discontinuing her digoxin.    She was last evaluated in a telemedicine visit in May 2020.  She has continued to use CPAP with 100% compliance since her last evaluation.  However, she does admit to difficulty in falling asleep.  Dr. Kevan Ayers has given her a prescription for Ambien to take as needed.  At times it may take her 1 to 2 hours to fall asleep but she believes when she sleeps she is sleeping well.  A download from April 21 through Nov 08, 2018 showed average usage 7 hours and 59 minutes.  She was 100% compliant.    95th percentile pressure was 18.4 with a maximum average pressure of 19.5.  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  I saw her on July 10, 2019 and since her previous evaluation she had developed Covid in November and was quarantined for 3 weeks.  Her symptoms included fevers chills fatigue and loss of appetite.  At that time she denied any chest pain.  Her last echo Doppler study in November 2019 showed an EF of 60 to 65% with grade 1 diastolic dysfunction.  She continued to use CPAP with 100% compliance.  A download was obtained her 95th percentile CPAP pressure is 16.3 with a maximum average pressure of 17.8.  She  denies any PND orthopnea.  There is no chest pain.   During her evaluation, I recommended she undergo a follow-up echo Doppler study particularly in follow-up of her Covid infection to make certain there was no reduction in LV function.  With her previously documented excellent systolic function, her digoxin was discontinued.  With her diabetes I also discussed Jardiance as a consideration for treatment.  She underwent an echo Doppler study on August 08, 2019.  EF was now reduced at 40 to 45%.  There was grade 2 diastolic dysfunction and abnormal tissue Doppler suggesting elevated left atrial pressure.  There was mild dilation of her ascending aorta at 40 mm.  She was felt to have moderate to severe mitral regurgitation and moderate left atrial dilation.  She was subsequently seen by Dr. Peter Martinique on August 27, 2019 when he was DOD and with her reduced LV function she was transitioned to [facility] 49/51 in place of valsartan and she was started on spironolactone 12.5 mg daily.  It was felt that her MR secondary to reduced LV function and LV dilation.  She has continued to use CPAP.  Her DME company is now [employer].  Download was obtained from March 17 through October 04, 2019 which showed 83% of usage days.  CPAP is set at a minimum pressure of 11 and maximum of 20 with her 95th percentile pressure at 17.9 and maximum average pressure at 19.1.  AHI is 1.0.  She is in need to undergo repeat procedure for her papilloma.    I saw her in December 10 2019.  At that time she denied any chest pain or palpitations but continued to experience decreased energy felt sluggish.   She continues to go undergo laser treatment every 4 months for her vocal cord papilloma.  She had recently seen Dr. Marlou Ayers who checks laboratory.  She continues to use CPAP with 100% compliance and does not sleep without it.    I recommended Ms. Zenor have a follow-up echo Doppler study since initiation of Entresto and this was done on January 01 2020.   This now showed normalization of LV function with EF back to 55 to 60%.  There was mild LVH and grade 1 diastolic dysfunction.  She had mild mitral regurgitation and mild to moderate aortic regurgitation.  When I last saw her on April 15, 2020 she continued to feel well.  She continued to use CPAP but there were several days where she was unable to use CPAP sh due to significant water in her hose but ultimately this problem was rectified by [employer], her DME company.  A download was obtained in the office an except for several days of nonuse when she had this problem compliance was excellent.  AHI is 0.7 with a 95th percentile pressure at 17 and maximum average pressure 18.3.  Of note, laboratory in June disclosed microcytic anemia.    Since I last saw her, she had had several evaluations at [facility] following her development of community-acquired pneumonia of her left lower lung.  She apparently required thoracentesis.  A chest x-ray had revealed complete consolidation and volume loss in the left lower lobe with loculated left pleural effusion.  She will be undergoing bronchoscopy in the next several weeks at which time she will undergo repeat ENT procedure to "clear her airway."  Presently, she denies any chest pain.  She had run out of atorvastatin for 2 to 3 weeks.  Her breathing has improved.  She continues to be on carvedilol long-acting 40 mg, Entresto 97/103 mg twice a day, furosemide 20 mg daily, spironolactone 12.5 mg daily, Jardiance in addition to her metformin and glipizide for diabetes.  She presents for evaluation.  Past Medical History:  Diagnosis Date   Anemia    Anxiety disorder    Asthmatic bronchitis    Benign essential HTN 08/06/11   ECHO-EF>55%   CHF (congestive heart failure) (HCC)    HeFPEF   COVID-19    Diabetes mellitus without complication (HCC)    Disorder of vocal cord    GERD (gastroesophageal reflux disease)    Hernia    Hyperlipidemia 03/14/12   Lexiscan  [id number]-[id number]; unchanged from previous study.   OSA on CPAP 03/28/07   sleep study-[facility]-AHI 9.42/HR and during REM sleep _0 .50/hr. The average 02 sat duringREM AND NREM was 97.0%   Thyroid disease    Upper respiratory infection     Past Surgical History:  Procedure Laterality Date   ABDOMINAL HYSTERECTOMY  2013   TAH BSO   BREAST EXCISIONAL BIOPSY Left 2017   papilloma   BREAST RECONSTRUCTION Bilateral    Patient had fat pads removed from both sides    BREAST SURGERY     Papilloma   DILATION AND CURETTAGE OF UTERUS     ENDOMETRIAL BIOPSY     HERNIA REPAIR     HYSTEROSCOPY     KNEE SURGERY     arthroscopic   NISSEN FUNDOPLICATION     OOPHORECTOMY     BSO   Parathyroid surg     Vocal Cord Surg      Allergies  Allergen Reactions   Midazolam Other (See Comments)    REACTION: cardiac arrest   Midazolam Hcl     Cardiac arrest    VERSED   Codeine Hives and Itching   Morphine And Related Hives   Paba Derivatives Nausea Only    PT REPORTS SEVERE NAUSEA AFTER SURGERY, DENIES VOMITING   Sulfonamide Derivatives Hives and Itching   Sulfamethoxazole Hives    Current Outpatient Medications  Medication Sig Dispense Refill   albuterol (PROVENTIL HFA;VENTOLIN HFA) 108 (90 BASE) MCG/ACT inhaler Inhale 2 puffs into the lungs every 4 (four) hours as needed for wheezing. 3.7 g 0   ascorbic acid (VITAMIN C) 500 MG tablet Take by mouth.     aspirin 81 MG EC tablet aspirin 81 mg tablet,delayed release  Take 1 tablet every day by oral route.     budesonide (PULMICORT) 0.5 MG/2ML nebulizer solution Inhale into the lungs.     Calcium Carbonate-Vitamin D (CALCIUM + D PO) Take 1 tablet by mouth daily.      calcium-vitamin D (OSCAL WITH D) 500-200 MG-UNIT TABS tablet Take by mouth.     carvedilol (COREG CR) 40 MG 24 hr capsule TAKE 1 CAPSULE BY MOUTH EVERY DAY IN THE EVENING 90 capsule 3   cycloSPORINE (RESTASIS) 0.05 % ophthalmic emulsion Place 1 drop into both  eyes daily.     Empagliflozin (JARDIANCE PO) Take by mouth.     fluconazole (DIFLUCAN) 150 MG tablet Take 1 tablet (150 mg total) by mouth every 3 (three) days as needed for up to 6 doses. (Patient taking differently: Take 150 mg by mouth as needed.) 6 tablet 0   furosemide (LASIX) 20 MG tablet Take 1 tablet (20 mg total) by mouth daily. 90 tablet 3   glipiZIDE (GLUCOTROL XL) 5 MG 24 hr tablet glipizide ER 5 mg tablet, extended release 24 hr  TAKE 1 TABLET BY MOUTH ONCE DAILY     metFORMIN (GLUCOPHAGE) 500 MG tablet Take by mouth 2 (two) times daily with a meal.     montelukast (SINGULAIR) 10 MG tablet Take 10 mg by mouth at bedtime.     Multiple Vitamin (MULTIVITAMIN) tablet Take 1 tablet by mouth daily.       Multiple Vitamins-Minerals (CENTRUM SILVER) tablet Take by mouth.     NEXIUM 40 MG capsule TAKE ONE CAPSULE BY MOUTH TWICE A DAY BEFORE MEALS 180 capsule 1   nitrofurantoin (MACRODANTIN) 50 MG capsule Take by mouth.     NON FORMULARY CPAP THERAPY     NONFORMULARY OR COMPOUNDED ITEM Boric acid vaginal suppositories 600 mg one per vagina as needed for yeast 30 each 0   nystatin-triamcinolone (MYCOLOG II) cream Apply 1 application topically 2 (two) times daily. 30 g 1   oxyCODONE-acetaminophen (PERCOCET/ROXICET) 5-325 MG tablet TAKE 1 TABLET BY MOUTH TWICE DAILY AS NEEDED FOR PAIN     phenazopyridine (PYRIDIUM) 200 MG tablet Take 1 tablet (200 mg total) by mouth 3 (three) times daily as needed for pain. Bladder pain 10 tablet 0   polyethylene glycol (MIRALAX / GLYCOLAX) packet Take 17 g by mouth daily.     sacubitril-valsartan (ENTRESTO) 97-103 MG Take 1 tablet by mouth 2 (two) times daily. 180 tablet 3   spironolactone (ALDACTONE) 25 MG tablet Take 1/2 tablet ( 12.5 mg ) daily 45 tablet 3   zolpidem (AMBIEN) 10 MG tablet Take 1 tablet (10 mg total) by mouth at bedtime as needed. For sleep 30 tablet 3   atorvastatin (LIPITOR) 40 MG tablet Take 1 tablet (40 mg total) by mouth daily. 90 tablet  3   omega-3 acid ethyl esters (LOVAZA) 1 g capsule Take 2 capsules (2 g total) by mouth 2 (two) times daily. 360 capsule 3   No current facility-administered medications for this visit.    Social History   Socioeconomic History   Marital status: Married    Spouse name: Not on file   Number of  children: Not on file   Years of education: Not on file   Highest education level: Not on file  Occupational History   Not on file  Tobacco Use   Smoking status: Never   Smokeless tobacco: Never  Vaping Use   Vaping Use: Never used  Substance and Sexual Activity   Alcohol use: No    Alcohol/week: 0.0 standard drinks   Drug use: No   Sexual activity: Yes    Birth control/protection: Post-menopausal, Surgical    Comment: HYST-1st intercourse 75 yo-Fewer than 5 partners,DES NEG  Other Topics Concern   Not on file  Social History Narrative   Not on file   Social Determinants of Health   Financial Resource Strain: Not on file  Food Insecurity: Not on file  Transportation Needs: Not on file  Physical Activity: Not on file  Stress: Not on file  Social Connections: Not on file  Intimate Partner Violence: Not on file    Family History  Problem Relation Age of Onset   Diabetes Father    Hypertension Father    Cancer Father        Prostate cancer   Arthritis Father    Gout Father    Hypertension Mother    Stroke Mother    Breast cancer Maternal Grandmother        Age 41's   Ovarian cancer Paternal Grandmother    Breast cancer Sister        Age 105   Kidney disease Sister    Social history is notable in that she is married has 4 children 9 grandchildren. There is no tobacco or alcohol use.  ROS General: Negative; No fevers, chills, or night sweats; positive for fatigue HEENT: Occasional hoarseness.  In the past she has had difficulty with vocal cord papillomas. No changes in vision or hearing, sinus congestion; papillomas on her vocal cord Pulmonary: Previous pneumonia, left  lower lung consolidation with loculated pleural effusion, status post thoracentesis Cardiovascular: See HPI GI: Negative; No nausea, vomiting, diarrhea, or abdominal pain GU: Negative; No dysuria, hematuria, or difficulty voiding Musculoskeletal: Positive for mild myalgias; spinal stenosis Hematologic/Oncology: Mild microcytic anemia Endocrine: Positive for diabetes mellitus.  No cold or heat intolerance. Neuro: Negative; no changes in balance, headaches Skin: Negative; No rashes or skin lesions Psychiatric: Negative; No behavioral problems, depression Sleep: Positive for sleep apnea; she continues to be on CPAP.  No residual snoring, daytime sleepiness, hypersomnolence, bruxism, restless legs, hypnogognic hallucinations, no cataplexy Other comprehensive 14 point system review is negative.   PE BP (!) 142/72   Pulse 79   Ht _0  (1.651 m)   Wt 201 lb 3.2 oz (91.3 kg)   SpO2 94%   BMI 33.48 kg/m    Repeat blood pressure by me was 130/70  Wt Readings from Last 3 Encounters:  12/19/20 201 lb 3.2 oz (91.3 kg)  08/06/20 213 lb 6.4 oz (96.8 kg)  04/15/20 209 lb (94.8 kg)   General: Alert, oriented, no distress.  Skin: normal turgor, no rashes, warm and dry HEENT: Normocephalic, atraumatic. Pupils equal round and reactive to light; sclera anicteric; extraocular muscles intact;  Nose without nasal septal hypertrophy Mouth/Parynx benign; Mallinpatti scale 3/4 Neck: No JVD, no carotid bruits; normal carotid upstroke Lungs: Decreased breath sounds left base Chest wall: without tenderness to palpitation Heart: PMI not displaced, RRR, s1 s2 normal, 1/6 systolic murmur, no diastolic murmur, no rubs, gallops, thrills, or heaves Abdomen: soft, nontender; no hepatosplenomehaly, BS+; abdominal aorta nontender  and not dilated by palpation. Back: no CVA tenderness Pulses 2+ Musculoskeletal: full range of motion, normal strength, no joint deformities Extremities: no clubbing cyanosis or edema,  Homan's sign negative  Neurologic: grossly nonfocal; Cranial nerves grossly wnl Psychologic: Normal mood and affect   ECG (independently read by me):  NSR at 80, LVH    October 2021 ECG (independently read by me): NSR at 64; LVH; normal intervals  June 21, 2021ECG (independently read by me): NSR 60; LVH , no ectopy, normal interals  October 05, 2019 ECG (independently read by me): Sinus rhythm at 72 bpm, LVH by voltage criteria in aVL.  No ectopy.  Normal intervals.  January 2021 ECG (independently read by me): Normal sinus rhythm at 62 bpm.  Borderline first-degree block with a PR normal at 202 ms.  No ectopy.  Normal QTc interval at 381 ms  December 2019 ECG (independently read by me): Normal sinus rhythm at 66 bpm.  First-degree AV block with appeared normal to 10 ms.  LVH by voltage criteria.  No ectopy.  October 2019 ECG (independently read by me): Normal sinus rhythm at 70 bpm.  LVH by voltage criteria probable left atrial enlargement.  Non-specific ST changes.  September 2018 ECG (independently read by me): Normal sinus rhythm at 64 bpm.  First-degree AV block.  PR interval 218 ms.  LVH by voltage criteria in aVL.  January 2018 ECG (independently read by me): Sinus bradycardia with first-degree AV block.  LVH by voltage criteria.  No significant ST-T changes.  October 2017 ECG (independently read by me): Normal sinus rhythm at 61 bpm.  LVH by voltage.  Borderline first-degree AV block with PR interval 206 ms.  April 2017 ECG (independently read by me): Normal sinus rhythm at 64 bpm.  First-degree AV block with a PR interval of 218 ms.  ECG (independently read by me): Normal sinus rhythm at 68 bpm.  LVH by voltage in aVL.  July 2016 ECG (independently read by me): Normal sinus rhythm at 64 bpm..  Borderline LVH.  No cigarette ST changes.  August 2015 ECG (independently read by me): Normal sinus rhythm at 67 beats per minute.  Moderate voltage criteria for LVH.  No significant ST  changes.  Borderline first degree AV block with a PR interval at 208 ms.  10/08/2013 ECG (independently read by me): Sinus rhythm at 59 beats per minute.  Borderline first-degree block with PR interval of 220 ms.  QTc interval normal.  Prior 04/17/2013 ECG: Normal sinus rhythm at 62 beats per minute. Borderline first degree AV block is no longer present with her PR interval now at 184 ms.  LABS: BMP Latest Ref Rng & Units 04/15/2020 12/12/2019 08/31/2019  Glucose 65 - 99 mg/dL 95 107(H) 137(H)  BUN 8 - 27 mg/dL 29(H) 33(H) 29(H)  Creatinine 0.57 - 1.00 mg/dL 1.15(H) 1.29(H) 0.98  BUN/Creat Ratio 12 - _0 30(H)  Sodium 134 - 144 mmol/L 140 140 143  Potassium 3.5 - 5.2 mmol/L 4.9 4.4 4.6  Chloride 96 - 106 mmol/L 101 99 104  CO2 20 - 29 mmol/L _1 Calcium 8.7 - 10.3 mg/dL 10.1 10.2 10.6(H)    Hepatic Function Latest Ref Rng & Units 12/12/2019 06/15/2016 04/05/2016  Total Protein 6.0 - 8.5 g/dL 7.4 7.3 7.7  Albumin 3.7 - 4.7 g/dL 4.1 4.4 4.4  AST 0 - 40 IU/L _2 ALT 0 - 32 IU/L _3 Alk Phosphatase 48 -  121 IU/L 106 83 87  Total Bilirubin 0.0 - 1.2 mg/dL 0.4 1.3(H) 0.7   CBC Latest Ref Rng & Units 04/15/2020 12/12/2019 06/15/2016  WBC 3.4 - 10.8 x10E3/uL 10.5 8.7 20.2(A)  Hemoglobin 11.1 - 15.9 g/dL 9.8(L) 9.6(L) 11.4(A)  Hematocrit 34.0 - 46.6 % 31.8(L) 31.2(L) 34.7(A)  Platelets 150 - 450 x10E3/uL 310 308 230   Lab Results  Component Value Date   MCV 74 (L) 04/15/2020   MCV 73 (L) 12/12/2019   MCV 72.8 (A) 06/15/2016   Lab Results  Component Value Date   TSH 1.640 12/12/2019   Lab Results  Component Value Date   HGBA1C 7.1 (H) 12/12/2019   Lipid Panel     Component Value Date/Time   CHOL 184 12/12/2019 1135   TRIG 105 12/12/2019 1135   HDL 46 12/12/2019 1135   CHOLHDL 4.0 12/12/2019 1135   CHOLHDL 4.9 07/14/2016 1031   VLDL 37 (H) 07/14/2016 1031   LDLCALC 119 (H) 12/12/2019 1135    RADIOLOGY: No results found.  IMPRESSION: 1. NICM  (nonischemic cardiomyopathy) ([place])   2. Chronic combined systolic and diastolic CHF, NYHA class 2 ([place])   3. OSA on CPAP   4. Mixed hyperlipidemia   5. Consolidation of left lower lobe of lung ([place])   6. Type 2 diabetes mellitus with complication, without long-term current use of insulin (HCC)   7. Microcytic anemia      ASSESSMENT AND PLAN: Ms. Hruska is a 75 year-old African-American female who has a history of morbid obesity and a history of a nonischemic cardiomyopathy with an ejection fraction of 20% initially documented in 2005 with subsequent normalization. A nuclear perfusion study in September 2013 showed normal perfusion without scar or ischemia. An echo Doppler study in August 2016 demonstrated normal systolic function with an EF of 60 to 60% and there was grade 1 diastolic dysfunction.  There was mild AR and mild MR.  Her left atrium was mildly dilated.   Her echo Doppler study in November 2019 showed EF at 60 to 65% with grade 1 diastolic dysfunction and mild aortic insufficiency.  As recently, she has been on a medical regimen consisting of long-acting carvedilol at 40 mg daily, furosemide 40 mg, and  digoxin 0.125 mg.  With her excellent LV function, I recommended discontinuance of digoxin in January 2021.  Following her Covid injection in in November 2020, I recommended a follow-up echo Doppler study which was done on August 08, 2019 and demonstrated an EF reduced at 40 to 45% and there was evidence for moderately severe mitral regurgitation and grade 2 diastolic dysfunction.  Her ARB therapy has been transitioned to Entresto 49/51 mg twice a day and spironolactone was also added to her regimen.  On subsequent evaluation, Delene Loll was attempted to be titrated to 97/103 mg twice a day but due to [place] blood pressure she was advised to resume the 49/51 mg twice daily.  Her most recent echo Doppler study since initiation of Entresto done on January 09 2020 now shows normalization of LV  function with EF 55 to 60%, mild LVH, grade 1 diastolic dysfunction, mild MR, and mild to moderate aortic insufficiency.  Presently, her blood pressure is stable and she is tolerating Entresto 97/103 mg twice daily, carvedilol CR 40 mg daily, furosemide 20 mg, Jardiance, and spironolactone 12.5 mg daily for her cardiomyopathy.  She developed several pneumonias and was found to have significant consolidation and volume loss in the left lower lobe with loculated left  pleural effusion.  She underwent successful thoracentesis and is scheduled to undergo bronchoscopy for further evaluation of the etiology for her collapse make certain there is no underlying neoplastic process.  At the time she will also undergo additional treatment for her airway with ENT.  She continues to use CPAP.  I obtained a download today which demonstrates very good compliance with average use at 6 hours and 3 minutes.  Her CPAP AutoSet at a range of 11 to 20 cm of water with her 95th percentile pressure at 15.5 and maximum average pressure of 17.  AHI 0.5.  There is no residual daytime sleepiness.  She continues to have microcytic indices on laboratory and denies any dark stools.  In the past I have suggested iron studies.  She is followed by Dr. Marlou Ayers.  She had run out of atorvastatin 40 mg and this will be renewed.  She is diabetic on metformin, glipizide in addition to her Jardiance.  I will see her in 6 months for reevaluation  Troy Sine, MD, [facility]  12/22/2020 5:20 PM

## 2020-12-19 NOTE — Telephone Encounter (Addendum)
This RN called patient to clarify which medications she needed refilled, as patient left before receiving AVS. This RN went through patient's medication list with her, found she needed refills for her Lipitor and Lovaza. Refills sent to pharmacy. Pt's other medications had existing refills, pt reminded to call her pharmacy to get these existing refills and to call the office back if she encounters any problems. Pt verbalized understanding, all questions and concerns addressed at this time. This RN to mail patient her AVS.

## 2020-12-22 ENCOUNTER — Encounter: Payer: Self-pay | Admitting: Cardiovascular Disease

## 2021-03-08 ENCOUNTER — Encounter (HOSPITAL_COMMUNITY): Payer: Self-pay | Admitting: Emergency Medicine

## 2021-03-08 ENCOUNTER — Inpatient Hospital Stay (HOSPITAL_COMMUNITY)
Admission: EM | Admit: 2021-03-08 | Discharge: 2021-03-12 | DRG: 194 | Disposition: A | Discharge status: Skilled Nursing Facility | Payer: Medicare Other | Attending: Internal Medicine | Admitting: Internal Medicine

## 2021-03-08 ENCOUNTER — Other Ambulatory Visit: Payer: Self-pay

## 2021-03-08 DIAGNOSIS — D638 Anemia in other chronic diseases classified elsewhere: Secondary | ICD-10-CM | POA: Diagnosis present

## 2021-03-08 DIAGNOSIS — K861 Other chronic pancreatitis: Secondary | ICD-10-CM | POA: Diagnosis present

## 2021-03-08 DIAGNOSIS — E538 Deficiency of other specified B group vitamins: Secondary | ICD-10-CM | POA: Diagnosis present

## 2021-03-08 DIAGNOSIS — Z841 Family history of disorders of kidney and ureter: Secondary | ICD-10-CM

## 2021-03-08 DIAGNOSIS — G4733 Obstructive sleep apnea (adult) (pediatric): Secondary | ICD-10-CM

## 2021-03-08 DIAGNOSIS — I5032 Chronic diastolic (congestive) heart failure: Secondary | ICD-10-CM | POA: Diagnosis present

## 2021-03-08 DIAGNOSIS — J440 Chronic obstructive pulmonary disease with acute lower respiratory infection: Secondary | ICD-10-CM | POA: Diagnosis present

## 2021-03-08 DIAGNOSIS — Z7951 Long term (current) use of inhaled steroids: Secondary | ICD-10-CM

## 2021-03-08 DIAGNOSIS — Z888 Allergy status to other drugs, medicaments and biological substances status: Secondary | ICD-10-CM

## 2021-03-08 DIAGNOSIS — R262 Difficulty in walking, not elsewhere classified: Secondary | ICD-10-CM | POA: Diagnosis present

## 2021-03-08 DIAGNOSIS — Z8616 Personal history of COVID-19: Secondary | ICD-10-CM

## 2021-03-08 DIAGNOSIS — K5909 Other constipation: Secondary | ICD-10-CM | POA: Diagnosis present

## 2021-03-08 DIAGNOSIS — Z803 Family history of malignant neoplasm of breast: Secondary | ICD-10-CM

## 2021-03-08 DIAGNOSIS — R918 Other nonspecific abnormal finding of lung field: Secondary | ICD-10-CM | POA: Diagnosis present

## 2021-03-08 DIAGNOSIS — Y95 Nosocomial condition: Secondary | ICD-10-CM | POA: Diagnosis present

## 2021-03-08 DIAGNOSIS — K219 Gastro-esophageal reflux disease without esophagitis: Secondary | ICD-10-CM | POA: Diagnosis present

## 2021-03-08 DIAGNOSIS — Z885 Allergy status to narcotic agent status: Secondary | ICD-10-CM

## 2021-03-08 DIAGNOSIS — Z8249 Family history of ischemic heart disease and other diseases of the circulatory system: Secondary | ICD-10-CM

## 2021-03-08 DIAGNOSIS — Z20822 Contact with and (suspected) exposure to covid-19: Secondary | ICD-10-CM | POA: Diagnosis present

## 2021-03-08 DIAGNOSIS — Z7984 Long term (current) use of oral hypoglycemic drugs: Secondary | ICD-10-CM

## 2021-03-08 DIAGNOSIS — J189 Pneumonia, unspecified organism: Secondary | ICD-10-CM | POA: Diagnosis not present

## 2021-03-08 DIAGNOSIS — I110 Hypertensive heart disease with heart failure: Secondary | ICD-10-CM | POA: Diagnosis present

## 2021-03-08 DIAGNOSIS — G893 Neoplasm related pain (acute) (chronic): Secondary | ICD-10-CM | POA: Diagnosis present

## 2021-03-08 DIAGNOSIS — I5042 Chronic combined systolic (congestive) and diastolic (congestive) heart failure: Secondary | ICD-10-CM | POA: Diagnosis present

## 2021-03-08 DIAGNOSIS — J449 Chronic obstructive pulmonary disease, unspecified: Secondary | ICD-10-CM | POA: Diagnosis present

## 2021-03-08 DIAGNOSIS — Z9989 Dependence on other enabling machines and devices: Secondary | ICD-10-CM

## 2021-03-08 DIAGNOSIS — D649 Anemia, unspecified: Secondary | ICD-10-CM | POA: Diagnosis present

## 2021-03-08 DIAGNOSIS — F1120 Opioid dependence, uncomplicated: Secondary | ICD-10-CM | POA: Diagnosis present

## 2021-03-08 DIAGNOSIS — Z833 Family history of diabetes mellitus: Secondary | ICD-10-CM

## 2021-03-08 DIAGNOSIS — Z882 Allergy status to sulfonamides status: Secondary | ICD-10-CM

## 2021-03-08 DIAGNOSIS — C3432 Malignant neoplasm of lower lobe, left bronchus or lung: Secondary | ICD-10-CM | POA: Diagnosis present

## 2021-03-08 DIAGNOSIS — D509 Iron deficiency anemia, unspecified: Secondary | ICD-10-CM | POA: Diagnosis not present

## 2021-03-08 DIAGNOSIS — Z823 Family history of stroke: Secondary | ICD-10-CM

## 2021-03-08 DIAGNOSIS — D630 Anemia in neoplastic disease: Secondary | ICD-10-CM | POA: Diagnosis present

## 2021-03-08 DIAGNOSIS — Z6832 Body mass index (BMI) 32.0-32.9, adult: Secondary | ICD-10-CM

## 2021-03-08 DIAGNOSIS — I10 Essential (primary) hypertension: Secondary | ICD-10-CM | POA: Diagnosis present

## 2021-03-08 DIAGNOSIS — Z79899 Other long term (current) drug therapy: Secondary | ICD-10-CM

## 2021-03-08 DIAGNOSIS — C7951 Secondary malignant neoplasm of bone: Secondary | ICD-10-CM | POA: Diagnosis present

## 2021-03-08 DIAGNOSIS — Z90710 Acquired absence of both cervix and uterus: Secondary | ICD-10-CM

## 2021-03-08 DIAGNOSIS — F419 Anxiety disorder, unspecified: Secondary | ICD-10-CM | POA: Diagnosis present

## 2021-03-08 DIAGNOSIS — R1032 Left lower quadrant pain: Secondary | ICD-10-CM

## 2021-03-08 DIAGNOSIS — F1110 Opioid abuse, uncomplicated: Secondary | ICD-10-CM | POA: Diagnosis present

## 2021-03-08 DIAGNOSIS — E119 Type 2 diabetes mellitus without complications: Secondary | ICD-10-CM

## 2021-03-08 DIAGNOSIS — E785 Hyperlipidemia, unspecified: Secondary | ICD-10-CM | POA: Diagnosis present

## 2021-03-08 DIAGNOSIS — Z8041 Family history of malignant neoplasm of ovary: Secondary | ICD-10-CM

## 2021-03-08 DIAGNOSIS — E871 Hypo-osmolality and hyponatremia: Secondary | ICD-10-CM | POA: Diagnosis not present

## 2021-03-08 DIAGNOSIS — E876 Hypokalemia: Secondary | ICD-10-CM | POA: Diagnosis not present

## 2021-03-08 DIAGNOSIS — M79606 Pain in leg, unspecified: Secondary | ICD-10-CM | POA: Diagnosis present

## 2021-03-08 DIAGNOSIS — Z7982 Long term (current) use of aspirin: Secondary | ICD-10-CM

## 2021-03-08 DIAGNOSIS — Z66 Do not resuscitate: Secondary | ICD-10-CM | POA: Diagnosis present

## 2021-03-08 LAB — COMPREHENSIVE METABOLIC PANEL
ALT: 16 U/L (ref 0–44)
AST: 24 U/L (ref 15–41)
Albumin: 2.7 g/dL — ABNORMAL LOW (ref 3.5–5.0)
Alkaline Phosphatase: 82 U/L (ref 38–126)
Anion gap: 10 (ref 5–15)
BUN: 10 mg/dL (ref 8–23)
CO2: 22 mmol/L (ref 22–32)
Calcium: 11.2 mg/dL — ABNORMAL HIGH (ref 8.9–10.3)
Chloride: 100 mmol/L (ref 98–111)
Creatinine, Ser: 0.82 mg/dL (ref 0.44–1.00)
GFR, Estimated: >60 mL/min (ref >60)
Glucose, Bld: 77 mg/dL (ref 70–99)
Potassium: 4.3 mmol/L (ref 3.5–5.1)
Sodium: 132 mmol/L — ABNORMAL LOW (ref 135–145)
Total Bilirubin: 1.5 mg/dL — ABNORMAL HIGH (ref 0.3–1.2)
Total Protein: 7.6 g/dL (ref 6.5–8.1)

## 2021-03-08 LAB — CBC WITH DIFFERENTIAL/PLATELET
Abs Immature Granulocytes: 0.21 10*3/uL — ABNORMAL HIGH (ref 0.00–0.07)
Basophils Absolute: 0.1 10*3/uL (ref 0.0–0.1)
Basophils Relative: 0 %
Eosinophils Absolute: 0 10*3/uL (ref 0.0–0.5)
Eosinophils Relative: 0 %
HCT: 28.9 % — ABNORMAL LOW (ref 36.0–46.0)
Hemoglobin: 9 g/dL — ABNORMAL LOW (ref 12.0–15.0)
Immature Granulocytes: 1 %
Lymphocytes Relative: 8 %
Lymphs Abs: 1.7 10*3/uL (ref 0.7–4.0)
MCH: 23.4 pg — ABNORMAL LOW (ref 26.0–34.0)
MCHC: 31.1 g/dL (ref 30.0–36.0)
MCV: 75.3 fL — ABNORMAL LOW (ref 80.0–100.0)
Monocytes Absolute: 1.1 10*3/uL — ABNORMAL HIGH (ref 0.1–1.0)
Monocytes Relative: 5 %
Neutro Abs: 18.1 10*3/uL — ABNORMAL HIGH (ref 1.7–7.7)
Neutrophils Relative %: 86 %
Platelets: 399 10*3/uL (ref 150–400)
RBC: 3.84 MIL/uL — ABNORMAL LOW (ref 3.87–5.11)
RDW: 20.6 % — ABNORMAL HIGH (ref 11.5–15.5)
WBC: 21.2 10*3/uL — ABNORMAL HIGH (ref 4.0–10.5)
nRBC: 0 % (ref 0.0–0.2)

## 2021-03-08 LAB — LIPASE, BLOOD: Lipase: 29 U/L (ref 11–51)

## 2021-03-08 MED ORDER — ACETAMINOPHEN 500 MG PO TABS
1000.0000 mg | ORAL_TABLET | Freq: Once | ORAL | Status: AC
  Administered 2021-03-08: 1000 mg via ORAL
  Filled 2021-03-08: qty 2

## 2021-03-08 MED ORDER — SODIUM CHLORIDE 0.9 % IV BOLUS
500.0000 mL | Freq: Once | INTRAVENOUS | Status: AC
  Administered 2021-03-09: 500 mL via INTRAVENOUS

## 2021-03-08 NOTE — ED Provider Notes (Signed)
[facility] DEPT Provider Note   CSN: [id number] Arrival date & time: 03/08/21  1819     History Chief Complaint  Patient presents with   Abdominal Pain    Jamie Ayers is a 75 y.o. female.  Complains of left lower quadrant abdominal pain for couple days  The history is provided by the patient and medical records. No language interpreter was used.  Abdominal Pain Pain location:  LLQ Pain quality: aching   Pain radiates to:  Does not radiate Pain severity:  Moderate Timing:  Constant Progression:  Worsening Chronicity:  New Context: not alcohol use   Relieved by:  Nothing Associated symptoms: no chest pain, no cough, no diarrhea, no fatigue and no hematuria       Past Medical History:  Diagnosis Date   Anemia    Anxiety disorder    Asthmatic bronchitis    Benign essential HTN 08/06/11   ECHO-EF>55%   CHF (congestive heart failure) (HCC)    HeFPEF   COVID-19    Diabetes mellitus without complication (HCC)    Disorder of vocal cord    GERD (gastroesophageal reflux disease)    Hernia    Hyperlipidemia 03/14/12   Lexiscan myoview-WNL; unchanged from previous study.   OSA on CPAP 03/28/07   sleep study-[facility]-AHI 9.42/HR and during REM sleep @37 .50/hr. 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   Upper respiratory infection    Asthmatic bronchitis    CHF (congestive heart failure) (HCC)    Benign essential HTN    GERD (gastroesophageal reflux  disease)    Intraductal papilloma of breast 08/31/2011   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/2011   Fibroid    Endocervical polyp    CONSTIPATION, CHRONIC 08/26/2010   INTESTINAL GAS 08/26/2010   FULL INCONTINENCE OF FECES 08/26/2010   COLONIC POLYPS, ADENOMATOUS, HX OF 08/24/2010    Past Surgical History:  Procedure Laterality Date   ABDOMINAL HYSTERECTOMY  2013   TAH BSO   BREAST EXCISIONAL BIOPSY Left 2017   papilloma   BREAST RECONSTRUCTION Bilateral    Patient had fat pads removed from both sides    BREAST SURGERY     Papilloma   DILATION AND CURETTAGE OF UTERUS     ENDOMETRIAL BIOPSY     HERNIA REPAIR     HYSTEROSCOPY     KNEE SURGERY     arthroscopic   NISSEN FUNDOPLICATION     OOPHORECTOMY     BSO   Parathyroid surg     Vocal Cord Surg       OB History     Gravida  4   Para  4   Term  4   Preterm      AB      Living  4      SAB      IAB      Ectopic      Multiple      Live Births  Family History  Problem Relation Age of Onset   Diabetes Father    Hypertension Father    Cancer Father        Prostate cancer   Arthritis Father    Gout Father    Hypertension Mother    Stroke Mother    Breast cancer Maternal Grandmother        Age 59's   Ovarian cancer Paternal Grandmother    Breast cancer Sister        Age 72   Kidney disease Sister     Social History   Tobacco Use   Smoking status: Never   Smokeless tobacco: Never  Vaping Use   Vaping Use: Never used  Substance Use Topics   Alcohol use: No    Alcohol/week: 0.0 standard drinks   Drug use: No    Home Medications Prior to Admission medications   Medication Sig Start Date End Date Taking? Authorizing Provider  albuterol (PROVENTIL HFA;VENTOLIN HFA) 108 (90 BASE) MCG/ACT inhaler Inhale 2 puffs into the lungs every 4 (four) hours as needed for wheezing. 02/18/12 12/19/20  Corlis Leak, MD  ascorbic acid (VITAMIN C) 500 MG tablet Take by mouth.    [provider]   aspirin 81 MG EC tablet aspirin 81 mg tablet,delayed release  Take 1 tablet every day by oral route.    [provider]  atorvastatin (LIPITOR) 40 MG tablet Take 1 tablet (40 mg total) by mouth daily. 12/19/20   Troy Sine, MD  budesonide (PULMICORT) 0.5 MG/2ML nebulizer solution Inhale into the lungs. 04/17/19   [provider]  Calcium Carbonate-Vitamin D (CALCIUM + D PO) Take 1 tablet by mouth daily.     [provider]  calcium-vitamin D (OSCAL WITH D) 500-200 MG-UNIT TABS tablet Take by mouth. 03/13/10   [provider]  carvedilol (COREG CR) 40 MG 24 hr capsule TAKE 1 CAPSULE BY MOUTH EVERY DAY IN THE EVENING 09/30/20   Troy Sine, MD  cycloSPORINE (RESTASIS) 0.05 % ophthalmic emulsion Place 1 drop into both eyes daily. 05/17/16   [provider]  Empagliflozin (JARDIANCE PO) Take by mouth.    [provider]  fluconazole (DIFLUCAN) 150 MG tablet Take 1 tablet (150 mg total) by mouth every 3 (three) days as needed for up to 6 doses. Patient taking differently: Take 150 mg by mouth as needed. 08/06/20   Cailan General Pierini, MD  furosemide (LASIX) 20 MG tablet Take 1 tablet (20 mg total) by mouth daily. 09/03/20   Troy Sine, MD  glipiZIDE (GLUCOTROL XL) 5 MG 24 hr tablet glipizide ER 5 mg tablet, extended release 24 hr  TAKE 1 TABLET BY MOUTH ONCE DAILY    [provider]  metFORMIN (GLUCOPHAGE) 500 MG tablet Take by mouth 2 (two) times daily with a meal.    [provider]  montelukast (SINGULAIR) 10 MG tablet Take 10 mg by mouth at bedtime.    [provider]  Multiple Vitamin (MULTIVITAMIN) tablet Take 1 tablet by mouth daily.      [provider]  Multiple Vitamins-Minerals (CENTRUM SILVER) tablet Take by mouth.    [provider]  NEXIUM 40 MG capsule TAKE ONE CAPSULE BY MOUTH TWICE A DAY BEFORE MEALS 05/08/20   Troy Sine, MD  nitrofurantoin (MACRODANTIN) 50 MG capsule Take by  mouth. 01/19/19   [provider]  NON FORMULARY CPAP THERAPY    [provider]  NONFORMULARY OR COMPOUNDED ITEM Boric acid  vaginal suppositories 600 mg one per vagina as needed for yeast 08/06/20   Arieanna Pressey Pierini, MD  nystatin-triamcinolone [facility] II) cream Apply 1 application topically 2 (two) times daily. 05/09/19   Fontaine, Belinda Block, MD  omega-3 acid ethyl esters (LOVAZA) 1 g capsule Take 2 capsules (2 g total) by mouth 2 (two) times daily. 12/19/20   Troy Sine, MD  oxyCODONE-acetaminophen (PERCOCET/ROXICET) 5-325 MG tablet TAKE 1 TABLET BY MOUTH TWICE DAILY AS NEEDED FOR PAIN 06/12/19   [provider]  phenazopyridine (PYRIDIUM) 200 MG tablet Take 1 tablet (200 mg total) by mouth 3 (three) times daily as needed for pain. Bladder pain 11/06/19   Vincient Vanaman Pierini, MD  polyethylene glycol [facility] / Floria Raveling) packet Take 17 g by mouth daily. 06/23/16   [provider]  sacubitril-valsartan (ENTRESTO) 97-103 MG Take 1 tablet by mouth 2 (two) times daily. 09/30/20   Troy Sine, MD  spironolactone (ALDACTONE) 25 MG tablet Take 1/2 tablet ( 12.5 mg ) daily 09/30/20   Troy Sine, MD  zolpidem (AMBIEN) 10 MG tablet Take 1 tablet (10 mg total) by mouth at bedtime as needed. For sleep 08/29/17   Troy Sine, MD    Allergies    Midazolam, Midazolam hcl, Codeine, Morphine and related, Paba derivatives, Sulfonamide derivatives, and Sulfamethoxazole  Review of Systems   Review of Systems  Constitutional:  Negative for appetite change and fatigue.  HENT:  Negative for congestion, ear discharge and sinus pressure.   Eyes:  Negative for discharge.  Respiratory:  Negative for cough.   Cardiovascular:  Negative for chest pain.  Gastrointestinal:  Positive for abdominal pain. Negative for diarrhea.  Genitourinary:  Negative for frequency and hematuria.  Musculoskeletal:  Negative for back pain.  Skin:  Negative for rash.  Neurological:  Negative for  seizures and headaches.  Psychiatric/Behavioral:  Negative for hallucinations.    Physical Exam Updated Vital Signs BP (!) 176/91   Pulse 95   Temp 98.2 F (36.8 C) (Oral)   Resp 14   SpO2 98%   Physical Exam Vitals and nursing note reviewed.  Constitutional:      Appearance: She is well-developed.  HENT:     Head: Normocephalic.  Eyes:     General: No scleral icterus.    Conjunctiva/sclera: Conjunctivae normal.  Neck:     Thyroid: No thyromegaly.  Cardiovascular:     Rate and Rhythm: Normal rate and regular rhythm.     Heart sounds: No murmur heard.   No friction rub. No gallop.  Pulmonary:     Breath sounds: No stridor. No wheezing or rales.  Chest:     Chest wall: No tenderness.  Abdominal:     General: There is no distension.     Tenderness: There is abdominal tenderness. There is no rebound.     Comments: Tender left lower quadrant  Musculoskeletal:        General: Normal range of motion.     Cervical back: Neck supple.  Lymphadenopathy:     Cervical: No cervical adenopathy.  Skin:    Findings: No erythema or rash.  Neurological:     Mental Status: She is alert and oriented to person, place, and time.     Motor: No abnormal muscle tone.     Coordination: Coordination normal.  Psychiatric:        Behavior: Behavior normal.    ED Results / Procedures / Treatments   Labs (all labs ordered are listed, but only  abnormal results are displayed) Labs Reviewed  URINE CULTURE  CBC WITH DIFFERENTIAL/PLATELET  COMPREHENSIVE METABOLIC PANEL  LIPASE, BLOOD  URINALYSIS, ROUTINE W REFLEX MICROSCOPIC    EKG None  Radiology No results found.  Procedures Procedures   Medications Ordered in ED Medications  acetaminophen (TYLENOL) tablet 1,000 mg (has no administration in time range)  sodium chloride 0.9 % bolus 500 mL (has no administration in time range)    ED Course  I have reviewed the triage vital signs and the nursing notes.  Pertinent labs &  imaging results that were available during my care of the patient were reviewed by me and considered in my medical decision making (see chart for details).  Clinical Course as of 03/09/21 1652  Mon Mar 09, 2021  1537 Medically cleared. Waiting for PT eval. If they clear her to go home, She can go home and be discharged. If she needs SNF, message social worker to be aware.  [GL]  1640 Awaiting med rec from pharmacy. Will order home meds once this is done [GL]    Clinical Course User Index [GL] Loeffler, Adora Fridge, PA-C   MDM Rules/Calculators/A&P                           Patient with left lower quadrant abdominal pain.  Labs and CT scan pending.   Disposition will be determined by my colleague Dr. Florina Ou     linical Impression(s) / ED Diagnoses Final diagnoses:  None    Rx / DC Orders ED Discharge Orders     None        Milton Ferguson, MD 03/09/21 1655

## 2021-03-08 NOTE — ED Triage Notes (Signed)
Patient states abdominal pain that began last week. Patient states vomiting, no diarrhea or constipation.

## 2021-03-08 NOTE — ED Notes (Signed)
Pt has not produced urine. Told her that we would need to do an in-and-out cath. Pt refused

## 2021-03-08 NOTE — ED Notes (Signed)
Pt reports that she has been coughing up reddish tinged mucous for about a week.

## 2021-03-08 NOTE — ED Provider Notes (Signed)
Emergency Medicine Provider Triage Evaluation Note  Jamie Ayers , a 75 y.o. female  was evaluated in triage.  Pt complains of left-sided pain.  According to her son who is at bedside she has also been acting more confused for the last 2 or 3 days.  Patient herself denies any dysuria or hematuria.  She is no longer walking, usually able to walk at baseline..  Review of Systems  Positive: Confusion, decreased mobility, left flank pain Negative: Shortness of breath, chest  Physical Exam  BP (!) 177/81 (BP Location: Left Arm)   Pulse 91   Temp 98.2 F (36.8 C) (Oral)   Resp 16   SpO2 98%  Gen:   Awake, no distress   Resp:  Normal effort MSK:   Moves extremities without difficulty  Other:    Medical Decision Making  Medically screening exam initiated at 7:15 PM.  Appropriate orders placed.  NIMAH UPHOFF was informed that the remainder of the evaluation will be completed by another provider, this initial triage assessment does not replace that evaluation, and the importance of remaining in the ED until their evaluation is complete.  We will start labs to work-up for confusion and left leg pain   Sherrill Raring, Hershal Coria 03/08/21 Bertell Maria, MD 03/09/21 1655

## 2021-03-09 ENCOUNTER — Emergency Department (HOSPITAL_COMMUNITY): Payer: Medicare Other

## 2021-03-09 ENCOUNTER — Encounter (HOSPITAL_COMMUNITY): Payer: Self-pay

## 2021-03-09 DIAGNOSIS — D649 Anemia, unspecified: Secondary | ICD-10-CM

## 2021-03-09 DIAGNOSIS — J449 Chronic obstructive pulmonary disease, unspecified: Secondary | ICD-10-CM

## 2021-03-09 DIAGNOSIS — I110 Hypertensive heart disease with heart failure: Secondary | ICD-10-CM | POA: Diagnosis present

## 2021-03-09 DIAGNOSIS — E782 Mixed hyperlipidemia: Secondary | ICD-10-CM

## 2021-03-09 DIAGNOSIS — E538 Deficiency of other specified B group vitamins: Secondary | ICD-10-CM | POA: Diagnosis present

## 2021-03-09 DIAGNOSIS — T7840XA Allergy, unspecified, initial encounter: Secondary | ICD-10-CM | POA: Diagnosis present

## 2021-03-09 DIAGNOSIS — I10 Essential (primary) hypertension: Secondary | ICD-10-CM

## 2021-03-09 DIAGNOSIS — E871 Hypo-osmolality and hyponatremia: Secondary | ICD-10-CM | POA: Diagnosis not present

## 2021-03-09 DIAGNOSIS — I5032 Chronic diastolic (congestive) heart failure: Secondary | ICD-10-CM | POA: Diagnosis not present

## 2021-03-09 DIAGNOSIS — R1032 Left lower quadrant pain: Secondary | ICD-10-CM

## 2021-03-09 DIAGNOSIS — J440 Chronic obstructive pulmonary disease with acute lower respiratory infection: Secondary | ICD-10-CM | POA: Diagnosis present

## 2021-03-09 DIAGNOSIS — Z20822 Contact with and (suspected) exposure to covid-19: Secondary | ICD-10-CM | POA: Diagnosis present

## 2021-03-09 DIAGNOSIS — Z8616 Personal history of COVID-19: Secondary | ICD-10-CM | POA: Diagnosis not present

## 2021-03-09 DIAGNOSIS — Z7982 Long term (current) use of aspirin: Secondary | ICD-10-CM | POA: Diagnosis not present

## 2021-03-09 DIAGNOSIS — I5042 Chronic combined systolic (congestive) and diastolic (congestive) heart failure: Secondary | ICD-10-CM | POA: Diagnosis present

## 2021-03-09 DIAGNOSIS — G4733 Obstructive sleep apnea (adult) (pediatric): Secondary | ICD-10-CM

## 2021-03-09 DIAGNOSIS — K219 Gastro-esophageal reflux disease without esophagitis: Secondary | ICD-10-CM

## 2021-03-09 DIAGNOSIS — C7951 Secondary malignant neoplasm of bone: Secondary | ICD-10-CM | POA: Diagnosis present

## 2021-03-09 DIAGNOSIS — E119 Type 2 diabetes mellitus without complications: Secondary | ICD-10-CM | POA: Diagnosis present

## 2021-03-09 DIAGNOSIS — F1120 Opioid dependence, uncomplicated: Secondary | ICD-10-CM | POA: Diagnosis present

## 2021-03-09 DIAGNOSIS — D509 Iron deficiency anemia, unspecified: Secondary | ICD-10-CM

## 2021-03-09 DIAGNOSIS — G893 Neoplasm related pain (acute) (chronic): Secondary | ICD-10-CM | POA: Diagnosis not present

## 2021-03-09 DIAGNOSIS — D630 Anemia in neoplastic disease: Secondary | ICD-10-CM | POA: Diagnosis present

## 2021-03-09 DIAGNOSIS — Z79899 Other long term (current) drug therapy: Secondary | ICD-10-CM | POA: Diagnosis not present

## 2021-03-09 DIAGNOSIS — Y95 Nosocomial condition: Secondary | ICD-10-CM | POA: Diagnosis present

## 2021-03-09 DIAGNOSIS — E785 Hyperlipidemia, unspecified: Secondary | ICD-10-CM | POA: Diagnosis present

## 2021-03-09 DIAGNOSIS — K5909 Other constipation: Secondary | ICD-10-CM

## 2021-03-09 DIAGNOSIS — Z9989 Dependence on other enabling machines and devices: Secondary | ICD-10-CM

## 2021-03-09 DIAGNOSIS — Z515 Encounter for palliative care: Secondary | ICD-10-CM | POA: Diagnosis not present

## 2021-03-09 DIAGNOSIS — Z6832 Body mass index (BMI) 32.0-32.9, adult: Secondary | ICD-10-CM | POA: Diagnosis not present

## 2021-03-09 DIAGNOSIS — C3432 Malignant neoplasm of lower lobe, left bronchus or lung: Secondary | ICD-10-CM | POA: Diagnosis present

## 2021-03-09 DIAGNOSIS — Z66 Do not resuscitate: Secondary | ICD-10-CM | POA: Diagnosis present

## 2021-03-09 DIAGNOSIS — E876 Hypokalemia: Secondary | ICD-10-CM | POA: Diagnosis not present

## 2021-03-09 DIAGNOSIS — F1110 Opioid abuse, uncomplicated: Secondary | ICD-10-CM | POA: Diagnosis present

## 2021-03-09 DIAGNOSIS — J189 Pneumonia, unspecified organism: Principal | ICD-10-CM

## 2021-03-09 DIAGNOSIS — R918 Other nonspecific abnormal finding of lung field: Secondary | ICD-10-CM | POA: Diagnosis not present

## 2021-03-09 DIAGNOSIS — F419 Anxiety disorder, unspecified: Secondary | ICD-10-CM | POA: Diagnosis present

## 2021-03-09 DIAGNOSIS — K861 Other chronic pancreatitis: Secondary | ICD-10-CM | POA: Diagnosis present

## 2021-03-09 LAB — COMPREHENSIVE METABOLIC PANEL
ALT: 12 U/L (ref 0–44)
AST: 10 U/L — ABNORMAL LOW (ref 15–41)
Albumin: 2.5 g/dL — ABNORMAL LOW (ref 3.5–5.0)
Alkaline Phosphatase: 73 U/L (ref 38–126)
Anion gap: 9 (ref 5–15)
BUN: 10 mg/dL (ref 8–23)
CO2: 24 mmol/L (ref 22–32)
Calcium: 10.9 mg/dL — ABNORMAL HIGH (ref 8.9–10.3)
Chloride: 102 mmol/L (ref 98–111)
Creatinine, Ser: 0.7 mg/dL (ref 0.44–1.00)
GFR, Estimated: >60 mL/min (ref >60)
Glucose, Bld: 105 mg/dL — ABNORMAL HIGH (ref 70–99)
Potassium: 3.4 mmol/L — ABNORMAL LOW (ref 3.5–5.1)
Sodium: 135 mmol/L (ref 135–145)
Total Bilirubin: 0.6 mg/dL (ref 0.3–1.2)
Total Protein: 6.9 g/dL (ref 6.5–8.1)

## 2021-03-09 LAB — RESP PANEL BY RT-PCR (FLU A&B, COVID) ARPGX2
Influenza A by PCR: NEGATIVE
Influenza B by PCR: NEGATIVE
SARS Coronavirus 2 by RT PCR: NEGATIVE

## 2021-03-09 LAB — LACTIC ACID, PLASMA
Lactic Acid, Venous: 0.9 mmol/L (ref 0.5–1.9)
Lactic Acid, Venous: 1 mmol/L (ref 0.5–1.9)

## 2021-03-09 LAB — IRON AND TIBC
Iron: 29 ug/dL (ref 28–170)
Saturation Ratios: 19 % (ref 10.4–31.8)
TIBC: 153 ug/dL — ABNORMAL LOW (ref 250–450)
UIBC: 124 ug/dL

## 2021-03-09 LAB — RAPID URINE DRUG SCREEN, HOSP PERFORMED
Amphetamines: NOT DETECTED
Barbiturates: NOT DETECTED
Benzodiazepines: NOT DETECTED
Cocaine: NOT DETECTED
Opiates: POSITIVE — AB
Tetrahydrocannabinol: NOT DETECTED

## 2021-03-09 LAB — URINALYSIS, ROUTINE W REFLEX MICROSCOPIC
Glucose, UA: >=500 mg/dL — AB
Hgb urine dipstick: NEGATIVE
Ketones, ur: 5 mg/dL — AB
Leukocytes,Ua: NEGATIVE
Nitrite: NEGATIVE
Protein, ur: 100 mg/dL — AB
Specific Gravity, Urine: 1.01 (ref 1.005–1.030)
pH: 6 (ref 5.0–8.0)

## 2021-03-09 LAB — CBC WITH DIFFERENTIAL/PLATELET
Abs Immature Granulocytes: 0.12 10*3/uL — ABNORMAL HIGH (ref 0.00–0.07)
Basophils Absolute: 0.1 10*3/uL (ref 0.0–0.1)
Basophils Relative: 0 %
Eosinophils Absolute: 0 10*3/uL (ref 0.0–0.5)
Eosinophils Relative: 0 %
HCT: 26.7 % — ABNORMAL LOW (ref 36.0–46.0)
Hemoglobin: 8.3 g/dL — ABNORMAL LOW (ref 12.0–15.0)
Immature Granulocytes: 1 %
Lymphocytes Relative: 9 %
Lymphs Abs: 1.7 10*3/uL (ref 0.7–4.0)
MCH: 23 pg — ABNORMAL LOW (ref 26.0–34.0)
MCHC: 31.1 g/dL (ref 30.0–36.0)
MCV: 74 fL — ABNORMAL LOW (ref 80.0–100.0)
Monocytes Absolute: 1.2 10*3/uL — ABNORMAL HIGH (ref 0.1–1.0)
Monocytes Relative: 7 %
Neutro Abs: 14.9 10*3/uL — ABNORMAL HIGH (ref 1.7–7.7)
Neutrophils Relative %: 83 %
Platelets: 429 10*3/uL — ABNORMAL HIGH (ref 150–400)
RBC: 3.61 MIL/uL — ABNORMAL LOW (ref 3.87–5.11)
RDW: 20.4 % — ABNORMAL HIGH (ref 11.5–15.5)
WBC: 17.9 10*3/uL — ABNORMAL HIGH (ref 4.0–10.5)
nRBC: 0 % (ref 0.0–0.2)

## 2021-03-09 LAB — GLUCOSE, CAPILLARY: Glucose-Capillary: 117 mg/dL — ABNORMAL HIGH (ref 70–99)

## 2021-03-09 LAB — MAGNESIUM: Magnesium: 1.7 mg/dL (ref 1.7–2.4)

## 2021-03-09 LAB — LIPASE, BLOOD: Lipase: 32 U/L (ref 11–51)

## 2021-03-09 LAB — RETICULOCYTES
Immature Retic Fract: 20.1 % — ABNORMAL HIGH (ref 2.3–15.9)
RBC.: 3.6 MIL/uL — ABNORMAL LOW (ref 3.87–5.11)
Retic Count, Absolute: 32.8 10*3/uL (ref 19.0–186.0)
Retic Ct Pct: 0.9 % (ref 0.4–3.1)

## 2021-03-09 LAB — PROTIME-INR
INR: 1.1 (ref 0.8–1.2)
Prothrombin Time: 14.6 seconds (ref 11.4–15.2)

## 2021-03-09 LAB — VITAMIN B12: Vitamin B-12: 3909 pg/mL — ABNORMAL HIGH (ref 180–914)

## 2021-03-09 LAB — FERRITIN: Ferritin: 443 ng/mL — ABNORMAL HIGH (ref 11–307)

## 2021-03-09 LAB — PROCALCITONIN: Procalcitonin: <0.1 ng/mL

## 2021-03-09 LAB — FOLATE: Folate: 5.3 ng/mL — ABNORMAL LOW (ref >5.9)

## 2021-03-09 LAB — PHOSPHORUS: Phosphorus: 2.6 mg/dL (ref 2.5–4.6)

## 2021-03-09 MED ORDER — PIPERACILLIN-TAZOBACTAM 3.375 G IVPB
3.3750 g | Freq: Three times a day (TID) | INTRAVENOUS | Status: DC
  Administered 2021-03-10 – 2021-03-11 (×4): 3.375 g via INTRAVENOUS
  Filled 2021-03-09 (×5): qty 50

## 2021-03-09 MED ORDER — BUDESONIDE 0.5 MG/2ML IN SUSP
0.5000 mg | Freq: Two times a day (BID) | RESPIRATORY_TRACT | Status: DC
  Administered 2021-03-09 – 2021-03-12 (×4): 0.5 mg via RESPIRATORY_TRACT
  Filled 2021-03-09 (×6): qty 2

## 2021-03-09 MED ORDER — VANCOMYCIN HCL IN DEXTROSE 1-5 GM/200ML-% IV SOLN
1000.0000 mg | Freq: Once | INTRAVENOUS | Status: AC
  Administered 2021-03-09: 1000 mg via INTRAVENOUS
  Filled 2021-03-09: qty 200

## 2021-03-09 MED ORDER — OXYCODONE-ACETAMINOPHEN 5-325 MG PO TABS
1.0000 | ORAL_TABLET | Freq: Once | ORAL | Status: AC
  Administered 2021-03-09: 1 via ORAL
  Filled 2021-03-09: qty 1

## 2021-03-09 MED ORDER — ATORVASTATIN CALCIUM 40 MG PO TABS
40.0000 mg | ORAL_TABLET | Freq: Every day | ORAL | Status: DC
  Administered 2021-03-10 – 2021-03-12 (×3): 40 mg via ORAL
  Filled 2021-03-09 (×3): qty 1

## 2021-03-09 MED ORDER — OXYCODONE-ACETAMINOPHEN 5-325 MG PO TABS
1.0000 | ORAL_TABLET | Freq: Four times a day (QID) | ORAL | Status: DC | PRN
  Administered 2021-03-09 – 2021-03-12 (×7): 1 via ORAL
  Filled 2021-03-09 (×7): qty 1

## 2021-03-09 MED ORDER — SODIUM CHLORIDE 0.9 % IV SOLN
75.0000 mL/h | INTRAVENOUS | Status: AC
  Administered 2021-03-09: 75 mL/h via INTRAVENOUS

## 2021-03-09 MED ORDER — SODIUM CHLORIDE 0.9 % IV SOLN
2.0000 g | Freq: Once | INTRAVENOUS | Status: AC
  Administered 2021-03-09: 2 g via INTRAVENOUS
  Filled 2021-03-09: qty 2

## 2021-03-09 MED ORDER — FENTANYL CITRATE PF 50 MCG/ML IJ SOSY
50.0000 ug | PREFILLED_SYRINGE | Freq: Once | INTRAMUSCULAR | Status: AC
  Administered 2021-03-09: 50 ug via INTRAVENOUS
  Filled 2021-03-09: qty 1

## 2021-03-09 MED ORDER — SACUBITRIL-VALSARTAN 97-103 MG PO TABS
1.0000 | ORAL_TABLET | Freq: Two times a day (BID) | ORAL | Status: DC
  Administered 2021-03-09 – 2021-03-12 (×6): 1 via ORAL
  Filled 2021-03-09 (×7): qty 1

## 2021-03-09 MED ORDER — ONDANSETRON HCL 4 MG/2ML IJ SOLN
4.0000 mg | Freq: Four times a day (QID) | INTRAMUSCULAR | Status: DC | PRN
  Administered 2021-03-09 – 2021-03-12 (×5): 4 mg via INTRAVENOUS
  Filled 2021-03-09 (×6): qty 2

## 2021-03-09 MED ORDER — POTASSIUM CHLORIDE 10 MEQ/100ML IV SOLN
10.0000 meq | INTRAVENOUS | Status: AC
  Administered 2021-03-10 (×2): 10 meq via INTRAVENOUS
  Filled 2021-03-09 (×2): qty 100

## 2021-03-09 MED ORDER — SODIUM CHLORIDE 0.9 % IV BOLUS
1000.0000 mL | Freq: Once | INTRAVENOUS | Status: AC
  Administered 2021-03-09: 1000 mL via INTRAVENOUS

## 2021-03-09 MED ORDER — HYDROCODONE-ACETAMINOPHEN 5-325 MG PO TABS
1.0000 | ORAL_TABLET | Freq: Once | ORAL | Status: AC
  Administered 2021-03-09: 1 via ORAL
  Filled 2021-03-09: qty 1

## 2021-03-09 MED ORDER — INSULIN ASPART 100 UNIT/ML IJ SOLN
0.0000 [IU] | INTRAMUSCULAR | Status: DC
  Administered 2021-03-10: 1 [IU] via SUBCUTANEOUS
  Administered 2021-03-11: 2 [IU] via SUBCUTANEOUS
  Administered 2021-03-12: 1 [IU] via SUBCUTANEOUS
  Filled 2021-03-09: qty 0.09

## 2021-03-09 MED ORDER — MONTELUKAST SODIUM 10 MG PO TABS
10.0000 mg | ORAL_TABLET | Freq: Every day | ORAL | Status: DC
  Administered 2021-03-09 – 2021-03-11 (×3): 10 mg via ORAL
  Filled 2021-03-09 (×3): qty 1

## 2021-03-09 MED ORDER — CARVEDILOL PHOSPHATE ER 10 MG PO CP24
10.0000 mg | ORAL_CAPSULE | Freq: Every day | ORAL | Status: DC
  Administered 2021-03-10 – 2021-03-12 (×3): 10 mg via ORAL
  Filled 2021-03-09 (×3): qty 1

## 2021-03-09 MED ORDER — ENOXAPARIN SODIUM 40 MG/0.4ML IJ SOSY
40.0000 mg | PREFILLED_SYRINGE | INTRAMUSCULAR | Status: DC
  Administered 2021-03-10 – 2021-03-12 (×3): 40 mg via SUBCUTANEOUS
  Filled 2021-03-09 (×4): qty 0.4

## 2021-03-09 MED ORDER — ACETAMINOPHEN 650 MG RE SUPP: 650.0000 mg | Freq: Four times a day (QID) | RECTAL | Status: DC | PRN

## 2021-03-09 MED ORDER — ASPIRIN EC 81 MG PO TBEC
81.0000 mg | DELAYED_RELEASE_TABLET | Freq: Every day | ORAL | Status: DC
  Administered 2021-03-10 – 2021-03-12 (×3): 81 mg via ORAL
  Filled 2021-03-09 (×3): qty 1

## 2021-03-09 MED ORDER — OXYCODONE-ACETAMINOPHEN 5-325 MG PO TABS
1.0000 | ORAL_TABLET | ORAL | Status: DC | PRN
Start: 2021-03-09 — End: 2021-03-09
  Administered 2021-03-09 (×2): 1 via ORAL
  Filled 2021-03-09 (×2): qty 1

## 2021-03-09 MED ORDER — IOHEXOL 350 MG/ML SOLN
80.0000 mL | Freq: Once | INTRAVENOUS | Status: AC | PRN
  Administered 2021-03-09: 80 mL via INTRAVENOUS

## 2021-03-09 MED ORDER — MAGNESIUM SULFATE IN D5W 1-5 GM/100ML-% IV SOLN
1.0000 g | Freq: Once | INTRAVENOUS | Status: AC
  Administered 2021-03-09: 1 g via INTRAVENOUS
  Filled 2021-03-09: qty 100

## 2021-03-09 MED ORDER — VANCOMYCIN HCL 1250 MG/250ML IV SOLN: 1250.0000 mg | INTRAVENOUS | Status: DC

## 2021-03-09 MED ORDER — PANTOPRAZOLE SODIUM 40 MG PO TBEC
40.0000 mg | DELAYED_RELEASE_TABLET | Freq: Every day | ORAL | Status: DC
  Administered 2021-03-10 – 2021-03-12 (×3): 40 mg via ORAL
  Filled 2021-03-09 (×3): qty 1

## 2021-03-09 MED ORDER — POLYETHYLENE GLYCOL 3350 17 G PO PACK
17.0000 g | PACK | Freq: Every day | ORAL | Status: DC
  Administered 2021-03-10 – 2021-03-12 (×3): 17 g via ORAL
  Filled 2021-03-09 (×3): qty 1

## 2021-03-09 MED ORDER — VANCOMYCIN HCL IN DEXTROSE 1-5 GM/200ML-% IV SOLN: 1000.0000 mg | INTRAVENOUS | Status: DC

## 2021-03-09 MED ORDER — ACETAMINOPHEN 325 MG PO TABS: 650.0000 mg | ORAL_TABLET | Freq: Four times a day (QID) | ORAL | Status: DC | PRN

## 2021-03-09 MED ORDER — VANCOMYCIN HCL 750 MG/150ML IV SOLN
750.0000 mg | Freq: Once | INTRAVENOUS | Status: AC
  Administered 2021-03-09: 750 mg via INTRAVENOUS
  Filled 2021-03-09: qty 150

## 2021-03-09 MED ORDER — ALBUTEROL SULFATE (2.5 MG/3ML) 0.083% IN NEBU: 3.0000 mL | INHALATION_SOLUTION | RESPIRATORY_TRACT | Status: DC | PRN

## 2021-03-09 NOTE — Progress Notes (Signed)
CSW completed FL2 and faxed patient's clinicals out for review to local facilities.  Madilyn Fireman, MSW, LCSW Transitions of Care  Clinical Social Worker II [phone]

## 2021-03-09 NOTE — ED Notes (Signed)
Pt asleep, equal chest rise noted.  Will continue to monitor.

## 2021-03-09 NOTE — Discharge Planning (Signed)
RNCM consulted regarding safe discharge planning (Home with Home Health vs Skilled Nursing Facility Placement).  Physical Therapy evaluation placed; will follow up after recommendations from PT.     

## 2021-03-09 NOTE — Progress Notes (Signed)
A consult was received from an ED physician for cefepime per pharmacy dosing.  The patient's profile has been reviewed for ht/wt/allergies/indication/available labs.   A one time order has been placed for cefepime 2g IV x1.  Further antibiotics/pharmacy consults should be ordered by admitting physician if indicated.                       Thank you, Dimple Nanas, PharmD 03/09/2021 5:34 PM

## 2021-03-09 NOTE — NC FL2 (Signed)
[place] LEVEL OF CARE SCREENING TOOL     IDENTIFICATION  Patient Name: Jamie Ayers Birthdate: 06/10/1946 Sex: female Admission Date (Current Location): 03/08/2021  [facility] and [place] Number:  Herbalist and Address:  [facility],  [address] [place], [place]      Provider Number: [id number]  Attending Physician Name and Address:  Daleen Bo, MD  Relative Name and Phone Number:       Current Level of Care: Hospital Recommended Level of Care: [place] Prior Approval Number:    Date Approved/Denied:   PASRR Number: [id number]  Discharge Plan: SNF    Current Diagnoses: Patient Active Problem List   Diagnosis Date Noted   Exertional shortness of breath 01/03/2015   Chest pain 01/03/2015   Morbid obesity ([place]) 10/08/2013   Microcytic anemia 10/08/2013   OSA on CPAP 10/08/2013   NICM (nonischemic cardiomyopathy) ([place]) 03/11/2013   Hyperlipemia 07/10/2012   Allergic rhinitis 07/10/2012   Sleep apnea 07/10/2012   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   Upper respiratory infection    Asthmatic bronchitis    CHF (congestive heart failure) (HCC)    Benign essential HTN    GERD (gastroesophageal reflux disease)    Intraductal papilloma of breast 08/31/2011   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/2011   Fibroid    Endocervical polyp    CONSTIPATION, CHRONIC 08/26/2010   INTESTINAL GAS 08/26/2010   FULL INCONTINENCE OF FECES 08/26/2010   COLONIC POLYPS, ADENOMATOUS, HX OF 08/24/2010    Orientation RESPIRATION BLADDER Height & Weight     Self, Time, Situation, Place  Normal Continent Weight:   Height:     BEHAVIORAL SYMPTOMS/MOOD NEUROLOGICAL BOWEL NUTRITION STATUS      Continent Diet  AMBULATORY STATUS COMMUNICATION OF NEEDS Skin   Limited Assist Verbally Normal                       Personal Care Assistance Level of Assistance  Bathing, Dressing, Feeding Bathing Assistance: Limited  assistance Feeding assistance: Independent Dressing Assistance: Limited assistance     Functional Limitations Info  Sight, Hearing, Speech Sight Info: Impaired (Glasses) Hearing Info: Adequate Speech Info: Adequate    SPECIAL CARE FACTORS FREQUENCY  PT (By licensed PT), OT (By licensed OT)     PT Frequency: 5x weekly OT Frequency: 5x weekly            Contractures Contractures Info: Not present    Additional Factors Info  Code Status, Allergies Code Status Info: Full Allergies Info: Midazolam   Midazolam Hcl   Codeine   Morphine And Related   Paba Derivatives   Sulfonamide Derivatives   Sulfamethoxazole           Current Medications (03/09/2021):  This is the current hospital active medication list No current facility-administered medications for this encounter.   Current Outpatient Medications  Medication Sig Dispense Refill   albuterol (PROVENTIL HFA;VENTOLIN HFA) 108 (90 BASE) MCG/ACT inhaler Inhale 2 puffs into the lungs every 4 (four) hours as needed for wheezing. 3.7 g 0   ascorbic acid (VITAMIN C) 500 MG tablet Take by mouth.     aspirin 81 MG EC tablet aspirin 81 mg tablet,delayed release  Take 1 tablet every day by oral route.     atorvastatin (LIPITOR) 40 MG tablet Take 1 tablet (40 mg total) by mouth daily. 90 tablet 3   budesonide (PULMICORT) 0.5 MG/2ML nebulizer solution Inhale into the lungs.  Calcium Carbonate-Vitamin D (CALCIUM + D PO) Take 1 tablet by mouth daily.      calcium-vitamin D (OSCAL WITH D) 500-200 MG-UNIT TABS tablet Take by mouth.     carvedilol (COREG CR) 40 MG 24 hr capsule TAKE 1 CAPSULE BY MOUTH EVERY DAY IN THE EVENING 90 capsule 3   cycloSPORINE (RESTASIS) 0.05 % ophthalmic emulsion Place 1 drop into both eyes daily.     Empagliflozin (JARDIANCE PO) Take by mouth.     fluconazole (DIFLUCAN) 150 MG tablet Take 1 tablet (150 mg total) by mouth every 3 (three) days as needed for up to 6 doses. (Patient taking differently: Take 150 mg  by mouth as needed.) 6 tablet 0   furosemide (LASIX) 20 MG tablet Take 1 tablet (20 mg total) by mouth daily. 90 tablet 3   glipiZIDE (GLUCOTROL XL) 5 MG 24 hr tablet glipizide ER 5 mg tablet, extended release 24 hr  TAKE 1 TABLET BY MOUTH ONCE DAILY     metFORMIN (GLUCOPHAGE) 500 MG tablet Take by mouth 2 (two) times daily with a meal.     montelukast (SINGULAIR) 10 MG tablet Take 10 mg by mouth at bedtime.     Multiple Vitamin (MULTIVITAMIN) tablet Take 1 tablet by mouth daily.       Multiple Vitamins-Minerals (CENTRUM SILVER) tablet Take by mouth.     NEXIUM 40 MG capsule TAKE ONE CAPSULE BY MOUTH TWICE A DAY BEFORE MEALS 180 capsule 1   nitrofurantoin (MACRODANTIN) 50 MG capsule Take by mouth.     NON FORMULARY CPAP THERAPY     NONFORMULARY OR COMPOUNDED ITEM Boric acid vaginal suppositories 600 mg one per vagina as needed for yeast 30 each 0   nystatin-triamcinolone (MYCOLOG II) cream Apply 1 application topically 2 (two) times daily. 30 g 1   omega-3 acid ethyl esters (LOVAZA) 1 g capsule Take 2 capsules (2 g total) by mouth 2 (two) times daily. 360 capsule 3   oxyCODONE-acetaminophen (PERCOCET/ROXICET) 5-325 MG tablet TAKE 1 TABLET BY MOUTH TWICE DAILY AS NEEDED FOR PAIN     phenazopyridine (PYRIDIUM) 200 MG tablet Take 1 tablet (200 mg total) by mouth 3 (three) times daily as needed for pain. Bladder pain 10 tablet 0   polyethylene glycol (MIRALAX / GLYCOLAX) packet Take 17 g by mouth daily.     sacubitril-valsartan (ENTRESTO) 97-103 MG Take 1 tablet by mouth 2 (two) times daily. 180 tablet 3   spironolactone (ALDACTONE) 25 MG tablet Take 1/2 tablet ( 12.5 mg ) daily 45 tablet 3   zolpidem (AMBIEN) 10 MG tablet Take 1 tablet (10 mg total) by mouth at bedtime as needed. For sleep 30 tablet 3     Discharge Medications: Please see discharge summary for a list of discharge medications.  Relevant Imaging Results:  Relevant Lab Results:   Additional Information SSN:  [id number]  Archie Endo, LCSW

## 2021-03-09 NOTE — ED Provider Notes (Signed)
I received signout from [employer] at 1600.  At the time of signout was that patient had been medically cleared and needed to be seen by physical therapy to determine if she was appropriate for SNF placement.  Physical therapy saw patient and agreed that she would need SNF.  Prior to discharge, I reassessed patient and she has continued abdominal pain and some coarse lung sounds on auscultation.  I reviewed all labs and imaging.  Noticed that the patient's white count was 21.1.  Also saw that chest x-ray and CT abdomen pelvis were both concerning for left lower lobe pneumonia versus mass.  Given that there is no other obvious source of infection, pneumonia is the most likely source.  Given patient's recent hospital admission, patient likely has a HCAP.  I started patient on cefepime, vanc, and IV fluids.  Obtain blood cultures and lactate levels.  At this point, do not feel that patient is appropriate to go to SNF.  I will admit patient to hospitalist service for treatment of HCAP.  I discussed this case extensively with Dr. Darl Householder.  He agrees with this plan and has personally assisted with the treatment of this patient.  1903: Spoke with Dr. Braulio Bosch regarding this case. She agrees to accept this patient for admission.       Physical Exam  BP (!) 160/71   Pulse 77   Temp 98.5 F (36.9 C)   Resp 19   SpO2 100%   Physical Exam Vitals and nursing note reviewed.  Constitutional:      General: She is not in acute distress.    Appearance: Normal appearance. She is not ill-appearing, toxic-appearing or diaphoretic.  HENT:     Head: Normocephalic and atraumatic.     Mouth/Throat:     Mouth: Mucous membranes are moist.     Pharynx: Oropharynx is clear. No oropharyngeal exudate or posterior oropharyngeal erythema.  Eyes:     General:        Right eye: No discharge.        Left eye: No discharge.     Conjunctiva/sclera: Conjunctivae normal.  Cardiovascular:     Rate and Rhythm: Normal  rate and regular rhythm.     Pulses: Normal pulses.     Heart sounds: Normal heart sounds, S1 normal and S2 normal. No murmur heard.   No friction rub. No gallop.  Pulmonary:     Effort: Pulmonary effort is normal. No respiratory distress.     Breath sounds: Rhonchi present. No wheezing or rales.  Abdominal:     General: Abdomen is flat. Bowel sounds are normal. There is no distension.     Palpations: Abdomen is soft. There is no pulsatile mass.     Tenderness: There is generalized abdominal tenderness. There is no guarding or rebound.  Musculoskeletal:     Right lower leg: No edema.     Left lower leg: No edema.  Skin:    General: Skin is warm and dry.     Coloration: Skin is not jaundiced.     Findings: No bruising, erythema, lesion or rash.  Neurological:     General: No focal deficit present.     Mental Status: She is alert and oriented to person, place, and time.  Psychiatric:        Mood and Affect: Mood normal.        Behavior: Behavior normal.          Adolphus Birchwood, PA-C 03/09/21 1925  Drenda Freeze, MD 03/09/21 2308    Drenda Freeze, MD 03/10/21 [phone]

## 2021-03-09 NOTE — ED Notes (Addendum)
Pts spouse, Lis Savitt, notified of pts d/c status via phone number listed in pts chart. Spouse reports her son will be here to pick up pt around 0800 today.

## 2021-03-09 NOTE — ED Notes (Signed)
No urine output at this time.

## 2021-03-09 NOTE — ED Provider Notes (Signed)
Physical Exam  BP (!) 166/86 (BP Location: Left Arm)   Pulse 87   Temp 98.3 F (36.8 C) (Oral)   Resp 18   SpO2 98%   Physical Exam Vitals and nursing note reviewed.  Constitutional:      Appearance: She is obese. She is not ill-appearing or toxic-appearing.  HENT:     Head: Normocephalic and atraumatic.     Nose: Nose normal.     Mouth/Throat:     Mouth: Mucous membranes are dry.     Pharynx: Oropharynx is clear. Uvula midline. No oropharyngeal exudate or posterior oropharyngeal erythema.     Tonsils: No tonsillar exudate.  Eyes:     General: Lids are normal. Vision grossly intact.        Right eye: No discharge.        Left eye: No discharge.     Extraocular Movements: Extraocular movements intact.     Conjunctiva/sclera: Conjunctivae normal.     Pupils: Pupils are equal, round, and reactive to light.  Neck:     Trachea: Trachea and phonation normal.  Cardiovascular:     Rate and Rhythm: Normal rate and regular rhythm.     Pulses: Normal pulses.     Heart sounds: Normal heart sounds. No murmur heard. Pulmonary:     Effort: Pulmonary effort is normal. Tachypnea present. No bradypnea, accessory muscle usage, prolonged expiration or respiratory distress.     Breath sounds: Examination of the left-lower field reveals decreased breath sounds and rales. Decreased breath sounds and rales present. No wheezing.  Chest:     Chest wall: No mass, lacerations, deformity, swelling, tenderness, crepitus or edema.  Abdominal:     General: Bowel sounds are normal. There is no distension.     Palpations: Abdomen is soft.     Tenderness: There is no abdominal tenderness. There is no right CVA tenderness, left CVA tenderness, guarding or rebound.  Musculoskeletal:        General: No deformity.     Cervical back: Normal range of motion and neck supple. No edema, rigidity or crepitus. No pain with movement, spinous process tenderness or muscular tenderness.     Right lower leg: 1+ Edema  present.     Left lower leg: 1+ Edema present.  Lymphadenopathy:     Cervical: No cervical adenopathy.  Skin:    General: Skin is warm and dry.     Capillary Refill: Capillary refill takes less than 2 seconds.  Neurological:     General: No focal deficit present.     Mental Status: She is alert. Mental status is at baseline.     Sensory: Sensation is intact.     Motor: Motor function is intact.     Comments: Global weakness, however patient able to sit up independently at the bedside  Psychiatric:        Mood and Affect: Mood normal.    ED Course/Procedures   Clinical Course as of 03/09/21 1545  Mon Mar 09, 2021  1537 Medically cleared. Waiting for PT eval. If they clear her to go home, She can go home and be discharged. If she needs SNF, message social worker to be aware.  [GL]    Clinical Course User Index [GL] Loeffler, Adora Fridge, PA-C    Procedures  MDM    Care of this patient was assumed after discussion with RN.  Patient was seen overnight for abdominal pain that started a few days ago in the left lower quadrant without  radiation but with associated nausea.  Additionally has been increasingly confused for the last 2 days.  She denies any urinary symptoms.  Work-up  overnight revealed CBC with leukocytosis of 21,000 (17,000 on 9/17 at [facility]) and hemoglobin of 9, mildly decreased from patient's baseline of 9.8.  CMP with hyponatremia 132 and mildly elevated total bili to 1.5.  Lipase was normal.  CT of the abdomen pelvis was obtained which showed consolidation in the left lung base with small pleural effusion that was seen on previous CT.  Concerning for pneumonia versus obstructing mass.  Chronic pancreatitis but no acute changes in the abdomen or pelvis.  Patient was discharged at this point by preceding ED provider her abdomen is no longer tender to exam, however patient with bit of her discharge prior to urine specimen.  I was informed by RN this morning that patient's  family has arrived to the ED, however they will not be able to take her home.  Stating that she has been unable to walk for the last week which is not her baseline and that she lives at home with her elderly husband will be unable to assist her.  Additionally voiced concerns for opiate abuse, stating years long usage of opiates that has increased in frequency in the last several months.  RN instructed to straight catheterize the patient as she is unable to stand even with assistance.  Patient unable to urinate on paramedic, 500 cc released from bladder with straight catheterization.  Patient denies any saddle anesthesia or numbness, tingling in the lower extremities.  Endorses left-sided rib pain.  Extensive discussion with the patient's sons, Jamie Ayers and Jamie Ayers, who state that she was recently underwent a thoracentesis at [facility].  Upon chart review patient was recently diagnosed with non-small cell lung cancer by thoracentesis at [facility].  Was supposed to see outpatient oncologist at [facility] on 9/14, however was unable to attend that appointment due to being in the ER after a fall; appointment was rescheduled for today, however the patient called and canceled it stating that she had a prior engagement.  Does appear from chart review in care everywhere the patient has an appointment with the oncologist scheduled for 9/22 to discuss her diagnosis.  I personally reviewed this patient's PMDP.  It appears that since July patient has received multiple prescriptions for tramadol and oxycodone, additionally has 2 active prescriptions from 2 separate providers for fentanyl patches.  According to her sons, patient has been found at home to be placing more than 1 fentanyl patch on her body at a time, and they voiced concerns that she is "self-medicating".  At this time since patient is unable to stand do feel she would benefit from placement in a nursing facility to further evaluate her opiate  dependence and undergo physical therapy for deconditioning.  Disposition pending UA.  TOC consult placed; PT eval ordered.  Discussed his present plan for SNF placement with the patient at the bedside, she is agreeable to this disposition plan.  She denies opiate overuse.  She is not very forthcoming when discussing fentanyl patch prescription.  Pending PT evaluation patient will be discharged home with home health or may be eligible for SNF placement.  Extensive discussion with the family regarding role of discharge home with home health should she be cleared by PT.  At time of shift change change patient is voiding PT evaluation and recommendations.  Care signed out to oncoming ED provider, Paulita Cradle, PA-C.  All pertinent  HPI, physical exam, laboratory findings were discussed with her prior to my departure.  This chart was dictated using voice recognition software, Dragon. Despite the best efforts of this provider to proofread and correct errors, errors may still occur which can change documentation meaning.       Emeline Darling, PA-C 03/09/21 1546    Daleen Bo, MD 03/09/21 1722

## 2021-03-09 NOTE — Progress Notes (Addendum)
Pharmacy /Antibiotic Note  Jamie Ayers is a 75 y.o. female admitted on 03/08/2021 with post-obstructive pneumonia.  Pharmacy has been consulted for vancomycin and zosyn dosing.  Received vancomycin 1000mg  x1 at 1852 and cefepime 2g IV x1 at 1837. Per discussion with Dr. Roel Cluck, okay to d/c vancomycin if MRSA PCR returns as negative.   WBC 21> 17.9, Tmax 98.5, LA 0.9  Plan: Give additional vancomycin 750mg  IV now in addition to 1000mg  dose for a total of 1750mg , followed by Vancomycin 1000 mg IV q24 hours (eAUC 456, Scr 0.8, Vd 0.5) Zosyn 3.375g IV q8 hours (to begin 9/20 at 0200 since recent cefepime dose) F/u MRSA PCR, clinical improvement    Temp (24hrs), Avg:98.4 F (36.9 C), Min:98.3 F (36.8 C), Max:98.5 F (36.9 C)  Recent Labs  Lab 03/08/21 2200 03/09/21 1731 03/09/21 1836  WBC 21.2*  --  17.9*  CREATININE 0.82  --   --   LATICACIDVEN  --  0.9  --     CrCl cannot be calculated (Unknown ideal weight.).    Allergies  Allergen Reactions   Midazolam Other (See Comments)    REACTION: cardiac arrest   Midazolam Hcl     Cardiac arrest    VERSED   Codeine Hives and Itching   Morphine And Related Hives   Paba Derivatives Nausea Only    PT REPORTS SEVERE NAUSEA AFTER SURGERY, DENIES VOMITING   Sulfonamide Derivatives Hives and Itching   Sulfamethoxazole Hives    Antimicrobials this admission: Vancomycin 9/19 >>  Zosyn 9/19 >>  Cefepime x1  Dose adjustments this admission: N/A  Microbiology results: 9/19 BCx: sent 9/19 UCx: sent  9/19 MRSA PCR: ordered  Thank you for allowing pharmacy to be a part of this patient's care.  Dimple Nanas, PharmD 03/09/2021 7:52 PM

## 2021-03-09 NOTE — ED Notes (Signed)
RN called to lobby to speak with pts 2 sons.  Sons expressed concern about pt returning home due to mobility issues. They report pt lives in 3 story house, has great difficulty with ambulation due to pain and inability to bear weight. Sons report that they do not feel comfortable taking pt home where she resides with husband. EDP Rebekah made aware.

## 2021-03-09 NOTE — ED Notes (Signed)
Patient transported to CT 

## 2021-03-09 NOTE — ED Notes (Signed)
Son stated that if the chaplin comes by tomorrow his brother will be here

## 2021-03-09 NOTE — ED Notes (Signed)
Patient was unable to ambulate on her own. Patient need assistance x2 to ambulate. Patient was changed into hospital gown. Face was washed and room was cleaned. Patient is not interested in eating dinner because she feels nauseous. MD notified.

## 2021-03-09 NOTE — Progress Notes (Signed)
Chaplain engaged in an initial visit with Jamie Ayers and her son.  Son expressed wanting to get healthcare POA completed so that he can be more involved in Jamie Ayers's care.  Jamie Ayers does want all of her sons to be involved.  Chaplain provided education around Advanced Directive and told them how to contact her when they are ready to have it notarized. Chaplain also spoke with nurse about having being paged.    03/09/21 1300  Clinical Encounter Type  Visited With Patient and family together  Visit Type Initial;Social support

## 2021-03-09 NOTE — ED Notes (Signed)
Pt said that she would be able to have someone come pick her up before 8 am.

## 2021-03-09 NOTE — ED Provider Notes (Signed)
Nursing notes and vitals signs, including pulse oximetry, reviewed.  Summary of this visit's results, reviewed by myself:  EKG:  EKG Interpretation  Date/Time:    Ventricular Rate:    PR Interval:    QRS Duration:   QT Interval:    QTC Calculation:   R Axis:     Text Interpretation:          Labs:  Results for orders placed or performed during the hospital encounter of 03/08/21 (from the past 24 hour(s))  CBC with Differential     Status: Abnormal   Collection Time: 03/08/21 10:00 PM  Result Value Ref Range   WBC 21.2 (H) 4.0 - 10.5 K/uL   RBC 3.84 (L) 3.87 - 5.11 MIL/uL   Hemoglobin 9.0 (L) 12.0 - 15.0 g/dL   HCT 28.9 (L) 36.0 - 46.0 %   MCV 75.3 (L) 80.0 - 100.0 fL   MCH 23.4 (L) 26.0 - 34.0 pg   MCHC 31.1 30.0 - 36.0 g/dL   RDW 20.6 (H) 11.5 - 15.5 %   Platelets 399 150 - 400 K/uL   nRBC 0.0 0.0 - 0.2 %   Neutrophils Relative % 86 %   Neutro Abs 18.1 (H) 1.7 - 7.7 K/uL   Lymphocytes Relative 8 %   Lymphs Abs 1.7 0.7 - 4.0 K/uL   Monocytes Relative 5 %   Monocytes Absolute 1.1 (H) 0.1 - 1.0 K/uL   Eosinophils Relative 0 %   Eosinophils Absolute 0.0 0.0 - 0.5 K/uL   Basophils Relative 0 %   Basophils Absolute 0.1 0.0 - 0.1 K/uL   Immature Granulocytes 1 %   Abs Immature Granulocytes 0.21 (H) 0.00 - 0.07 K/uL  Comprehensive metabolic panel     Status: Abnormal   Collection Time: 03/08/21 10:00 PM  Result Value Ref Range   Sodium 132 (L) 135 - 145 mmol/L   Potassium 4.3 3.5 - 5.1 mmol/L   Chloride 100 98 - 111 mmol/L   CO2 22 22 - 32 mmol/L   Glucose, Bld 77 70 - 99 mg/dL   BUN 10 8 - 23 mg/dL   Creatinine, Ser 0.82 0.44 - 1.00 mg/dL   Calcium 11.2 (H) 8.9 - 10.3 mg/dL   Total Protein 7.6 6.5 - 8.1 g/dL   Albumin 2.7 (L) 3.5 - 5.0 g/dL   AST 24 15 - 41 U/L   ALT 16 0 - 44 U/L   Alkaline Phosphatase 82 38 - 126 U/L   Total Bilirubin 1.5 (H) 0.3 - 1.2 mg/dL   GFR, Estimated >60 >60 mL/min   Anion gap 10 5 - 15  Lipase, blood     Status: None    Collection Time: 03/08/21 10:00 PM  Result Value Ref Range   Lipase 29 11 - 51 U/L    Imaging Studies: CT ABDOMEN PELVIS W CONTRAST  Result Date: 03/09/2021 CLINICAL DATA:  Abdominal abscess or infection suspected. Abdominal pain beginning last week. Vomiting. EXAM: CT ABDOMEN AND PELVIS WITH CONTRAST TECHNIQUE: Multidetector CT imaging of the abdomen and pelvis was performed using the standard protocol following bolus administration of intravenous contrast. CONTRAST:  60mL OMNIPAQUE IOHEXOL 350 MG/ML SOLN COMPARISON:  02/27/2021, 02/20/2021 FINDINGS: Lower chest: Small left pleural effusion. Consolidation in the left lung base. This is likely pneumonia, possibly mass. Similar appearance to previous study. Hepatobiliary: Focal lesion with centripetal enhancement demonstrated in the dome of the liver, likely a cavernous hemangioma. No other focal lesions identified. Gallbladder and bile ducts are unremarkable. Pancreas:  Calcifications in the uncinate process likely representing evidence of chronic pancreatitis. No acute changes identified. Spleen: Normal in size without focal abnormality. Adrenals/Urinary Tract: Adrenal glands are unremarkable. Kidneys are normal, without renal calculi, focal lesion, or hydronephrosis. Bladder is unremarkable. Stomach/Bowel: Stomach, small bowel, and colon are not abnormally distended. No wall thickening or inflammatory changes are appreciated. Appendix is normal. Vascular/Lymphatic: Aortic atherosclerosis. No enlarged abdominal or pelvic lymph nodes. Reproductive: Status post hysterectomy. No adnexal masses. Other: No free air or free fluid in the abdomen. Small periumbilical hernia containing fat. Musculoskeletal: No acute or significant osseous findings. IMPRESSION: 1. Consolidation in the left lung base with small left pleural effusion, likely pneumonia. Obstructing mass lesion would be also possible. This was present on previous studies. Correlate with prior CT chest. 2.  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 3. Chronic pancreatitis.  No acute inflammatory changes. 4. No evidence of bowel obstruction or inflammation. Electronically Signed   By: Lucienne Capers M.D.   On: 03/09/2021 01:10    2:58 AM No concerning findings on CT scan.  Particularly no evidence of diverticulitis.  The patient states she feels good at the present time and is okay with being discharged home.  Her abdomen is soft and nontender on my exam.    Rilan Eiland, MD 03/09/21 0300

## 2021-03-09 NOTE — Evaluation (Signed)
Physical Therapy Evaluation Patient Details Name: Jamie Ayers MRN: [id number] DOB: [birth year] Today's Date: 03/09/2021  History of Present Illness  Patient is 75 y.o. female brought to ED 9/18 for abdominal pain that started a few days ago in the left lower quadrant without radiation but with associated nausea and increased confusion. Family reports pt has been unable to walk for the last week which is not her baseline and that she lives at home with her elderly husband who is unable to assist her. She recently underwent a thoracentesis at [facility] and recently diagnosed with non-small cell lung cancer. PMH significant for anemia, anxiety, CHF, DM, GERD, HLD.    Clinical Impression  Jamie Ayers is 75 y.o. female admitted with above HPI and diagnosis. Patient is currently limited by functional impairments below (see PT problem list). Patient lives with her husband and per chart review has been limited over last week and unable to ambulate. Pt is unable to provide any PLOF and is oriented to self only. Patient was able to take several small steps at EOB with RW and required min assist to steady. Patient will benefit from continued skilled PT interventions to address impairments and progress independence with mobility, recommending SNF with 24/7 assist at this time. If pt's confusion improves and pt progresses mobility will update recommendations. Acute PT will follow and progress as able.        Recommendations for follow up therapy are one component of a multi-disciplinary discharge planning process, led by the attending physician.  Recommendations may be updated based on patient status, additional functional criteria and insurance authorization.  Follow Up Recommendations SNF    Equipment Recommendations  None recommended by PT    Recommendations for Other Services       Precautions / Restrictions Precautions Precautions: Fall Restrictions Weight Bearing Restrictions: No       Mobility  Bed Mobility Overal bed mobility: Needs Assistance Bed Mobility: Supine to Sit;Sit to Supine     Supine to sit: Min assist;HOB elevated Sit to supine: Min assist   General bed mobility comments: pt requried verbal/tactile cues to initiate bringing LE's off EOB. pt required min assist to fully slide legs off edge and raise trunk upright. Min assist to bring LE's back onto bed to move to supine.    Transfers Overall transfer level: Needs assistance Equipment used: Rolling walker (2 wheeled) Transfers: Sit to/from Stand Sit to Stand: Min assist         General transfer comment: pt required simple cues to initiate stand. Tactile cues effective for hand placement on RW.  Ambulation/Gait Ambulation/Gait assistance: Min assist Gait Distance (Feet): 4 Feet Assistive device: Rolling walker (2 wheeled) Gait Pattern/deviations: Decreased stride length;Decreased step length - right;Decreased step length - left;Step-through pattern;Shuffle;Trunk flexed Gait velocity: decr   General Gait Details: pt took small steps forward from EOB and then back. several small steps to [facility] with min assist to manage RW.  Stairs            Wheelchair Mobility    Modified Rankin (Stroke Patients Only)       Balance Overall balance assessment: Needs assistance Sitting-balance support: Feet supported;Bilateral upper extremity supported Sitting balance-Leahy Scale: Fair     Standing balance support: During functional activity;Bilateral upper extremity supported Standing balance-Leahy Scale: Poor                               Pertinent  Vitals/Pain Pain Assessment: Faces Faces Pain Scale: No hurt Pain Intervention(s): Monitored during session    Home Living Family/patient expects to be discharged to:: Private residence Living Arrangements: Spouse/significant other Available Help at Discharge: Family;Available PRN/intermittently (pt's sons involved, husband cannot  help pt physically)             Additional Comments: pt unable to provide any home environment info    Prior Function           Comments: pt unreliable historian given confusion     Hand Dominance        Extremity/Trunk Assessment   Upper Extremity Assessment Upper Extremity Assessment: Generalized weakness    Lower Extremity Assessment Lower Extremity Assessment: Generalized weakness    Cervical / Trunk Assessment Cervical / Trunk Assessment: Normal  Communication   Communication: No difficulties  Cognition Arousal/Alertness: Awake/alert Behavior During Therapy: WFL for tasks assessed/performed Overall Cognitive Status: Within Functional Limits for tasks assessed                                        General Comments      Exercises     Assessment/Plan    PT Assessment Patient needs continued PT services  PT Problem List Decreased strength;Decreased range of motion;Decreased activity tolerance;Decreased balance;Decreased mobility;Decreased knowledge of use of DME;Decreased knowledge of precautions;Decreased safety awareness;Decreased cognition       PT Treatment Interventions Gait training;DME instruction;Stair training;Functional mobility training;Therapeutic activities;Therapeutic exercise;Balance training;Patient/family education    PT Goals (Current goals can be found in the Care Plan section)  Acute Rehab PT Goals PT Goal Formulation: Patient unable to participate in goal setting Time For Goal Achievement: 03/23/21 Potential to Achieve Goals: Good    Frequency 7X/week   Barriers to discharge        Co-evaluation               AM-PAC PT "6 Clicks" Mobility  Outcome Measure Help needed turning from your back to your side while in a flat bed without using bedrails?: A Little Help needed moving from lying on your back to sitting on the side of a flat bed without using bedrails?: A Little Help needed moving to and from  a bed to a chair (including a wheelchair)?: A Little Help needed standing up from a chair using your arms (e.g., wheelchair or bedside chair)?: A Little Help needed to walk in hospital room?: A Little Help needed climbing 3-5 steps with a railing? : A Little 6 Click Score: 18    End of Session Equipment Utilized During Treatment: Gait belt Activity Tolerance: Patient tolerated treatment well Patient left: in bed;with call bell/phone within reach Nurse Communication: Mobility status PT Visit Diagnosis: Muscle weakness (generalized) (M62.81);Difficulty in walking, not elsewhere classified (R26.2)    Time: 7846-9629 PT Time Calculation (min) (ACUTE ONLY): 15 min   Charges:   PT Evaluation $PT Eval Moderate Complexity: 1 Mod          Verner Mould, DPT Acute Rehabilitation Services Office [phone] Pager [phone]   Jacques Navy 03/09/2021, 4:07 PM

## 2021-03-09 NOTE — H&P (Signed)
Jamie Ayers:[id number] DOB: 1946/06/14 DOA: 03/08/2021   PCP: [name], MD   Outpatient Specialists:     Pulmonary  Dr. Marcello Moores, Lucia Gaskins, MD    Oncology  Dr. [facility] health Supposed to follow-up with Dr. Fatima Sanger  Patient arrived to ER on 03/08/21 at 1819 Referred by Attending Drenda Freeze, MD   Patient coming from: home Lives  With family    Chief Complaint:   Chief Complaint  Patient presents with   Abdominal Pain    HPI: Jamie Ayers is a 75 y.o. female with medical history significant of Lung Ca anemia, anxiety, CHF, DM2 , GERD, HLD. LLL lung mass with osseous metastasis    Presented with abdominal pain for the past 2 days left lower quadrant associated some nausea.  Has been more confused for the past 2 days. Patient originally was brought to emergency department on 18 September and has stayed here until today. She lives alone they have elderly husband who is unable to help her. Recently diagnosed non-small cell lung cancer at [facility] was supposed to follow-up on 14th but unable to go As per ER provider  It appears that since July patient has received multiple prescriptions for tramadol and oxycodone, additionally has 2 active prescriptions from 2 separate providers for fentanyl patches.  According to her sons, patient has been found at home to be placing more than 1 fentanyl patch on her body at a time, and they voiced concerns that she is "self-medicating".  Initial plan was for patient to be placed at SNF   Patient states she has chronic leg pain and for which she takes fentanyl patches 25 mcg each she states she is supposed to apply 2 notes it seems like she is supposed to use 1 patch 50 MCG prescribed by her pulmonologist in August  When asked if she wants to follow-up regarding her lung cancer patient is somewhat unsure but states she has not figured out that she has an oncologist who she will work with she does not wish for [place] to  consult oncology here  Has  been vaccinated against [place] administered prior to discharge on 9/15.  Initial COVID TEST  NEGATIVE   Lab Results  Component Value Date   Gate NEGATIVE 03/09/2021     Regarding pertinent Chronic problems:     Hyperlipidemia -  on statins Lipitor Lipid Panel     Component Value Date/Time   CHOL 184 12/12/2019 1135   TRIG 105 12/12/2019 1135   HDL 46 12/12/2019 1135   CHOLHDL 4.0 12/12/2019 1135   CHOLHDL 4.9 07/14/2016 1031   VLDL 37 (H) 07/14/2016 1031   LDLCALC 119 (H) 12/12/2019 1135   LABVLDL 19 12/12/2019 1135   Non small cell lung cancer - Patient was recently was found to have left lower lung mass and she was planning on going under CT-guided biopsy with IR on 02/20/2021 .  She presented on 8/31 with chief complaint of  fever, cough.   In the ED, she was found to be febrile up to 101.5 with heart rate of 99 and an stable blood pressure. Patient CBC revealed leukocytosis with a white blood count of 21.4.  Patient was started on empiric vancomycin and and Zosyn which was then switched to vancomycin, cefepime, and Flagyl. Patient's chest x-ray in the ED showed opacification of left lung base from known left lower lobe lung mass with a small-to-moderate loculated left-sided  pleural effusion concerning for worsening pneumonia or aspiration. CT chest without contrast showed  left lower lobe lung mass appears similar in size and morphology with continued occlusion of the left lower lobe bronchus; there has been interval erosive changes to the posterior left eighth and ninth ribs. IR was consulted and patient underwent left lower lobe lung mass biopsy with FNA and core biopsy on 02/20/21.  FNA showed "non-small cell carcinoma, favor squamous cell carcinoma." Pulm consulted on 02/23/2021 for evaluation of the small loculated effusion seen on CT but did not feel it was contributing to symptoms; patient was not amenable to drainage. Pulm also did  not recommend bronchoscopy. SLP eval was unremarkable. She completed Augmentin on 9/9 for 10 total days; ID did not recommend further abx. Persistent leukocytosis was thought to be reactive to malignancy.  Hx of Abdominal pain - . It was increased to 10 mg with 5 mg of breakthrough on the day of discharge.   Urinary retention reported in July but pt denies prior history. She had foley placed on 9/10 with successful voiding trial 9/14. Continue to monitor for retention.    HTN on Coreg   chronic CHF diastolic - last echo July 2021 showed preserved EF and grade 1 diastolic dysfunction Entresto spironolactone   DM 2 -  Lab Results  Component Value Date   HGBA1C 7.1 (H) 12/12/2019   on metformin, glipizide and Jardiance      Morbid obesity-   BMI Readings from Last 1 Encounters:  12/19/20 33.48 kg/m    COPD -  followed by pulmonology  and [facility]   OSA -on nocturnal  CPAP,     Chronic anemia - baseline hg Hemoglobin & Hematocrit  Recent Labs    04/15/20 1414 03/08/21 2200 03/09/21 1836  HGB 9.8* 9.0* 8.3*     While in ER: CBC showed leukocytosis up to 21 CT of abdomen and pelvis was done but did show left lung base consolidation and small pleural effusion and chronic pancreatitis.  Questionable obstruction or mass versus consolidation. At first patient was supposed to be discharged to home but when family came to pick arrived they said that she has not been able to walk does not have baseline family also was concerned about opiate abuse Patient seem to be not be able to stand up even with assistance.   ED Triage Vitals  Enc Vitals Group     BP 03/08/21 1902 (!) 177/81     Pulse Rate 03/08/21 1902 91     Resp 03/08/21 1902 16     Temp 03/08/21 1902 98.2 F (36.8 C)     Temp Source 03/08/21 1902 Oral     SpO2 03/08/21 1902 98 %     Weight --      Height --      Head Circumference --      Peak Flow --      Pain Score 03/08/21 1927 5     Pain Loc --      Pain Edu? --       Excl. in [place]? --   TMAX(24)@     _________________________________________ Significant initial  Findings: Abnormal Labs Reviewed  CBC WITH DIFFERENTIAL/PLATELET - Abnormal; Notable for the following components:      Result Value   WBC 21.2 (*)    RBC 3.84 (*)    Hemoglobin 9.0 (*)    HCT 28.9 (*)    MCV 75.3 (*)    MCH 23.4 (*)  RDW 20.6 (*)    Neutro Abs 18.1 (*)    Monocytes Absolute 1.1 (*)    Abs Immature Granulocytes 0.21 (*)    All other components within normal limits  COMPREHENSIVE METABOLIC PANEL - Abnormal; Notable for the following components:   Sodium 132 (*)    Calcium 11.2 (*)    Albumin 2.7 (*)    Total Bilirubin 1.5 (*)    All other components within normal limits  URINALYSIS, ROUTINE W REFLEX MICROSCOPIC - Abnormal; Notable for the following components:   Glucose, UA >=500 (*)    Bilirubin Urine SMALL (*)    Ketones, ur 5 (*)    Protein, ur 100 (*)    Bacteria, UA RARE (*)    All other components within normal limits  CBC WITH DIFFERENTIAL/PLATELET - Abnormal; Notable for the following components:   WBC 17.9 (*)    RBC 3.61 (*)    Hemoglobin 8.3 (*)    HCT 26.7 (*)    MCV 74.0 (*)    MCH 23.0 (*)    RDW 20.4 (*)    Platelets 429 (*)    Neutro Abs 14.9 (*)    Monocytes Absolute 1.2 (*)    Abs Immature Granulocytes 0.12 (*)    All other components within normal limits   ____________________________________________ Ordered    CXR - PNA  CTabd/pelvis - 1. Consolidation in the left lung base with small left pleural effusion, likely pneumonia. Obstructing mass lesion would be also possible. This was present on previous studies. Correlate with prior CT chest. 2. 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 3. Chronic pancreatitis   _________________________   ECG: Ordered Personally reviewed by me showing: HR : 77 Rhythm:  NSR,  left atrial enlargement Left ventricular hypertrophy    no evidence of ischemic changes QTC  427 ____________________ This patient meets SIRS Criteria and may be septic.   The recent clinical data is shown below. Vitals:   03/09/21 1430 03/09/21 1528 03/09/21 1615 03/09/21 1630  BP: (!) 152/63 (!) 152/75 (!) 159/73 (!) 160/71  Pulse: 79 80 83 77  Resp: (!) 28 18 (!) 23 19  Temp:      TempSrc:      SpO2: 100% 100% 100% 100%    WBC     Component Value Date/Time   WBC 21.2 (H) 03/08/2021 2200   LYMPHSABS 1.7 03/08/2021 2200   LYMPHSABS 2.0 06/15/2016 1055   MONOABS 1.1 (H) 03/08/2021 2200   EOSABS 0.0 03/08/2021 2200   EOSABS 0.0 06/15/2016 1055   BASOSABS 0.1 03/08/2021 2200   BASOSABS 0.0 06/15/2016 1055     Lactic Acid, Venous    Component Value Date/Time   LATICACIDVEN 1.0 03/09/2021 2015    Procalcitonin   Ordered    UA  no evidence of UTI      Urine analysis:    Component Value Date/Time   COLORURINE YELLOW 03/09/2021 1019   APPEARANCEUR CLEAR 03/09/2021 1019   APPEARANCEUR Clear 12/12/2019 1455   LABSPEC 1.010 03/09/2021 1019   PHURINE 6.0 03/09/2021 1019   GLUCOSEU >=500 (A) 03/09/2021 1019   HGBUR NEGATIVE 03/09/2021 1019   BILIRUBINUR SMALL (A) 03/09/2021 1019   BILIRUBINUR Negative 12/12/2019 1455   KETONESUR 5 (A) 03/09/2021 1019   PROTEINUR 100 (A) 03/09/2021 1019   UROBILINOGEN 0.2 08/20/2014 1521   NITRITE NEGATIVE 03/09/2021 1019   LEUKOCYTESUR NEGATIVE 03/09/2021 1019    Results for orders placed or performed in visit on 08/06/20  Urine Culture  Status: None   Collection Time: 08/06/20  2:21 PM  Result Value Ref Range Status   MICRO NUMBER: [id number]  Final   SPECIMEN QUALITY: Adequate  Final   Sample Source URINE  Final   STATUS: FINAL  Final   ISOLATE 1:   Final    Less than 10,000 CFU/mL of single Gram positive organism isolated. No further testing will be performed. If clinically indicated, recollection using a method to minimize contamination, with prompt transfer to Urine Culture Transport Tube, is recommended.      _______________________________________________ Hospitalist was called for admission for possible postobstructive pna  The following Work up has been ordered so far:  Orders Placed This Encounter  Procedures   Urine Culture   Resp Panel by RT-PCR (Flu A&B, Covid) Nasopharyngeal Swab   Blood culture (routine x 2)   CT ABDOMEN PELVIS W CONTRAST   DG Chest Portable 1 View   CBC with Differential   Comprehensive metabolic panel   Lipase, blood   Urinalysis, Routine w reflex microscopic   Lactic acid, plasma   CBC with Differential   Diet Carb Modified Fluid consistency: Thin; Room service appropriate? Yes   In and [place]   Face-to-face encounter (required for Medicare/Medicaid patients)   Pharmacy Tech to prioritize PTA Med Rec   Consult to Transition of Care Team (SW and CM)   Consult to hospitalist   Patient needs PT consult as soon as possible.  Discharge is pending PT consult.  Please call  PT eval and treat   PT PLAN OF CARE CERT/RE-CERT   ED EKG   Insert peripheral IV    Following Medications were ordered in ER: Medications  oxyCODONE-acetaminophen (PERCOCET/ROXICET) 5-325 MG per tablet 1 tablet (1 tablet Oral Given 03/09/21 1613)  vancomycin (VANCOCIN) IVPB 1000 mg/200 mL premix (1,000 mg Intravenous New Bag/Given 03/09/21 1852)  acetaminophen (TYLENOL) tablet 1,000 mg (1,000 mg Oral Given 03/08/21 2257)  sodium chloride 0.9 % bolus 500 mL (0 mLs Intravenous Stopped 03/09/21 0225)  iohexol (OMNIPAQUE) 350 MG/ML injection 80 mL (80 mLs Intravenous Contrast Given 03/09/21 0041)  fentaNYL (SUBLIMAZE) injection 50 mcg (50 mcg Intravenous Given 03/09/21 0101)  HYDROcodone-acetaminophen (NORCO/VICODIN) 5-325 MG per tablet 1 tablet (1 tablet Oral Given 03/09/21 0559)  oxyCODONE-acetaminophen (PERCOCET/ROXICET) 5-325 MG per tablet 1 tablet (1 tablet Oral Given 03/09/21 0559)  sodium chloride 0.9 % bolus 1,000 mL (1,000 mLs Intravenous New Bag/Given 03/09/21 1837)   ceFEPIme (MAXIPIME) 2 g in sodium chloride 0.9 % 100 mL IVPB (0 g Intravenous Stopped 03/09/21 1851)        Consult Orders  (From admission, onward)           Start     Ordered   03/09/21 1840  Consult to hospitalist  Once       Provider:  (Not yet assigned)  Question Answer Comment  Place call to: [facility]   Reason for Consult Admit      03/09/21 1839   03/09/21 0943  Patient needs PT consult as soon as possible.  Discharge is pending PT consult.  Please call  PT eval and treat  Imminent discharge       Comments: Patient needs PT consult as soon as possible.  Discharge is pending PT consult.  Please call  Question:  Reason for PT?  Answer:  Eval and Treat   03/09/21 0942              OTHER Significant initial  Findings:  labs showing:    Recent Labs  Lab 03/08/21 2200 03/09/21 2015  NA 132* 135  K 4.3 3.4*  CO2 22 24  GLUCOSE 77 105*  BUN 10 10  CREATININE 0.82 0.70  CALCIUM 11.2* 10.9*  MG  --  1.7  PHOS  --  2.6    Cr  stable,   Lab Results  Component Value Date   CREATININE 0.82 03/08/2021   CREATININE 1.15 (H) 04/15/2020   CREATININE 1.29 (H) 12/12/2019    Recent Labs  Lab 03/08/21 2200  AST 24  ALT 16  ALKPHOS 82  BILITOT 1.5*  PROT 7.6  ALBUMIN 2.7*   Lab Results  Component Value Date   CALCIUM 11.2 (H) 03/08/2021          Plt: Lab Results  Component Value Date   PLT 399 03/08/2021        Recent Labs  Lab 03/08/21 2200  WBC 21.2*  NEUTROABS 18.1*  HGB 9.0*  HCT 28.9*  MCV 75.3*  PLT 399    HG/HCT    Down  from baseline see below    Component Value Date/Time   HGB 8.3 (L) 03/09/2021 1836   HGB 9.8 (L) 04/15/2020 1414   HCT 26.7 (L) 03/09/2021 1836   HCT 31.8 (L) 04/15/2020 1414   MCV 74.0 (L) 03/09/2021 1836   MCV 74 (L) 04/15/2020 1414      Recent Labs  Lab 03/08/21 2200  LIPASE 29           Cultures: No results found for: SDES, SPECREQUEST, CULT, REPTSTATUS   Radiological Exams on  Admission: CT ABDOMEN PELVIS W CONTRAST  Result Date: 03/09/2021 CLINICAL DATA:  Abdominal abscess or infection suspected. Abdominal pain beginning last week. Vomiting. EXAM: CT ABDOMEN AND PELVIS WITH CONTRAST TECHNIQUE: Multidetector CT imaging of the abdomen and pelvis was performed using the standard protocol following bolus administration of intravenous contrast. CONTRAST:  73mL OMNIPAQUE IOHEXOL 350 MG/ML SOLN COMPARISON:  02/27/2021, 02/20/2021 FINDINGS: Lower chest: Small left pleural effusion. Consolidation in the left lung base. This is likely pneumonia, possibly mass. Similar appearance to previous study. Hepatobiliary: Focal lesion with centripetal enhancement demonstrated in the dome of the liver, likely a cavernous hemangioma. No other focal lesions identified. Gallbladder and bile ducts are unremarkable. Pancreas: Calcifications in the uncinate process likely representing evidence of chronic pancreatitis. No acute changes identified. Spleen: Normal in size without focal abnormality. Adrenals/Urinary Tract: Adrenal glands are unremarkable. Kidneys are normal, without renal calculi, focal lesion, or hydronephrosis. Bladder is unremarkable. Stomach/Bowel: Stomach, small bowel, and colon are not abnormally distended. No wall thickening or inflammatory changes are appreciated. Appendix is normal. Vascular/Lymphatic: Aortic atherosclerosis. No enlarged abdominal or pelvic lymph nodes. Reproductive: Status post hysterectomy. No adnexal masses. Other: No free air or free fluid in the abdomen. Small periumbilical hernia containing fat. Musculoskeletal: No acute or significant osseous findings. IMPRESSION: 1. Consolidation in the left lung base with small left pleural effusion, likely pneumonia. Obstructing mass lesion would be also possible. This was present on previous studies. Correlate with prior CT chest. 2. 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 3. Chronic pancreatitis.  No acute inflammatory changes. 4.  No evidence of bowel obstruction or inflammation. Electronically Signed   By: Lucienne Capers M.D.   On: 03/09/2021 01:10   DG Chest Portable 1 View  Result Date: 03/09/2021 CLINICAL DATA:  Left lower lobe abnormal breath sounds on exam, vomiting EXAM: PORTABLE CHEST 1 VIEW COMPARISON:  11/03/2020 FINDINGS: Left  lower lobe masslike consolidation noted in the retrocardiac region obscuring the left hemidiaphragm compatible with underlying lung mass. This is better demonstrated on the abdomen CT. By CT, there is chest wall involvement and osseous involvement of the left tenth rib. Small left effusion noted laterally. Heart is enlarged. Right lung remains clear. Degenerative changes of the spine. IMPRESSION: Large left lower lobe mass with chest wall and rib involvement better demonstrated by CT. Small lateral left effusion Cardiomegaly Electronically Signed   By: Jerilynn Mages.  Shick M.D.   On: 03/09/2021 11:28   _______________________________________________________________________________________________________ Latest   Blood pressure (!) 160/71, pulse 77, temperature 98.5 F (36.9 C), resp. rate 19, SpO2 100 %.   Review of Systems:    Pertinent positives include:  fatigue, falls abdominal pain,  Constitutional:  No weight loss, night sweats, Fevers, chills,  weight loss  HEENT:  No headaches, Difficulty swallowing,Tooth/dental problems,Sore throat,  No sneezing, itching, ear ache, nasal congestion, post nasal drip,  Cardio-vascular:  No chest pain, Orthopnea, PND, anasarca, dizziness, palpitations.no Bilateral lower extremity swelling  GI:  No heartburn, indigestion,  nausea, vomiting, diarrhea, change in bowel habits, loss of appetite, melena, blood in stool, hematemesis Resp:  no shortness of breath at rest. No dyspnea on exertion, No excess mucus, no productive cough, No non-productive cough, No coughing up of blood.No change in color of mucus.No wheezing. Skin:  no rash or lesions. No  jaundice GU:  no dysuria, change in color of urine, no urgency or frequency. No straining to urinate.  No flank pain.  Musculoskeletal:  No joint pain or no joint swelling. No decreased range of motion. No back pain.  Psych:  No change in mood or affect. No depression or anxiety. No memory loss.  Neuro: no localizing neurological complaints, no tingling, no weakness, no double vision, no gait abnormality, no slurred speech, no confusion  All systems reviewed and apart from Lydia all are negative _______________________________________________________________________________________________ Past Medical History:   Past Medical History:  Diagnosis Date   Anemia    Anxiety disorder    Asthmatic bronchitis    Benign essential HTN 08/06/11   ECHO-EF>55%   CHF (congestive heart failure) (HCC)    HeFPEF   COVID-19    Diabetes mellitus without complication (HCC)    Disorder of vocal cord    GERD (gastroesophageal reflux disease)    Hernia    Hyperlipidemia 03/14/12   Lexiscan myoview-WNL; unchanged from previous study.   OSA on CPAP 03/28/07   sleep study-[facility] 9.42/HR and during REM sleep @37 .50/hr. The average 02 sat duringREM AND NREM was 97.0%   Thyroid disease    Upper respiratory infection       Past Surgical History:  Procedure Laterality Date   ABDOMINAL HYSTERECTOMY  2013   TAH BSO   BREAST EXCISIONAL BIOPSY Left 2017   papilloma   BREAST RECONSTRUCTION Bilateral    Patient had fat pads removed from both sides    BREAST SURGERY     Papilloma   DILATION AND CURETTAGE OF UTERUS     ENDOMETRIAL BIOPSY     HERNIA REPAIR     HYSTEROSCOPY     KNEE SURGERY     arthroscopic   NISSEN FUNDOPLICATION     OOPHORECTOMY     BSO   Parathyroid surg     Vocal Cord Surg      Social History:  Ambulatory  walker     reports that she has never smoked. She has never  used smokeless tobacco. She reports that she does not drink alcohol and does  not use drugs.    Family History:   Family History  Problem Relation Age of Onset   Diabetes Father    Hypertension Father    Cancer Father        Prostate cancer   Arthritis Father    Gout Father    Hypertension Mother    Stroke Mother    Breast cancer Maternal Grandmother        Age 6's   Ovarian cancer Paternal Grandmother    Breast cancer Sister        Age 20   Kidney disease Sister    ______________________________________________________________________________________________ Allergies: Allergies  Allergen Reactions   Midazolam Other (See Comments)    REACTION: cardiac arrest   Midazolam Hcl     Cardiac arrest    VERSED   Codeine Hives and Itching   Morphine And Related Hives   Paba Derivatives Nausea Only    PT REPORTS SEVERE NAUSEA AFTER SURGERY, DENIES VOMITING   Sulfonamide Derivatives Hives and Itching   Sulfamethoxazole Hives     Prior to Admission medications   Medication Sig Start Date End Date Taking? Authorizing Provider  albuterol (PROVENTIL HFA;VENTOLIN HFA) 108 (90 BASE) MCG/ACT inhaler Inhale 2 puffs into the lungs every 4 (four) hours as needed for wheezing. 02/18/12 12/19/20  Corlis Leak, MD  ascorbic acid (VITAMIN C) 500 MG tablet Take by mouth.    [provider]  aspirin 81 MG EC tablet aspirin 81 mg tablet,delayed release  Take 1 tablet every day by oral route.    [provider]  atorvastatin (LIPITOR) 40 MG tablet Take 1 tablet (40 mg total) by mouth daily. 12/19/20   Troy Sine, MD  budesonide (PULMICORT) 0.5 MG/2ML nebulizer solution Inhale into the lungs. 04/17/19   [provider]  Calcium Carbonate-Vitamin D (CALCIUM + D PO) Take 1 tablet by mouth daily.     [provider]  calcium-vitamin D (OSCAL WITH D) 500-200 MG-UNIT TABS tablet Take by mouth. 03/13/10   [provider]  carvedilol (COREG CR) 40 MG 24 hr capsule TAKE 1 CAPSULE BY MOUTH EVERY DAY IN THE EVENING 09/30/20   Troy Sine, MD  cycloSPORINE (RESTASIS) 0.05 % ophthalmic emulsion Place 1 drop into both eyes daily. 05/17/16   [provider]  Empagliflozin (JARDIANCE PO) Take by mouth.    [provider]  fluconazole (DIFLUCAN) 150 MG tablet Take 1 tablet (150 mg total) by mouth every 3 (three) days as needed for up to 6 doses. Patient taking differently: Take 150 mg by mouth as needed. 08/06/20   Joseph Pierini, MD  furosemide (LASIX) 20 MG tablet Take 1 tablet (20 mg total) by mouth daily. 09/03/20   Troy Sine, MD  glipiZIDE (GLUCOTROL XL) 5 MG 24 hr tablet glipizide ER 5 mg tablet, extended release 24 hr  TAKE 1 TABLET BY MOUTH ONCE DAILY    [provider]  metFORMIN (GLUCOPHAGE) 500 MG tablet Take by mouth 2 (two) times daily with a meal.    [provider]  montelukast (SINGULAIR) 10 MG tablet Take 10 mg by mouth at bedtime.    [provider]  Multiple Vitamin (MULTIVITAMIN) tablet Take 1 tablet by mouth daily.      [provider]  Multiple Vitamins-Minerals (CENTRUM SILVER) tablet Take by mouth.    [provider]  NEXIUM 40 MG capsule TAKE ONE CAPSULE  BY MOUTH TWICE A DAY BEFORE MEALS 05/08/20   Troy Sine, MD  nitrofurantoin (MACRODANTIN) 50 MG capsule Take by mouth. 01/19/19   [provider]  NON FORMULARY CPAP THERAPY    [provider]  NONFORMULARY OR COMPOUNDED ITEM Boric acid vaginal suppositories 600 mg one per vagina as needed for yeast 08/06/20   Joseph Pierini, MD  nystatin-triamcinolone (MYCOLOG II) cream Apply 1 application topically 2 (two) times daily. 05/09/19   Fontaine, Belinda Block, MD  omega-3 acid ethyl esters (LOVAZA) 1 g capsule Take 2 capsules (2 g total) by mouth 2 (two) times daily. 12/19/20   Troy Sine, MD  oxyCODONE-acetaminophen (PERCOCET/ROXICET) 5-325 MG tablet TAKE 1 TABLET BY MOUTH TWICE DAILY AS NEEDED FOR PAIN 06/12/19   [provider]  phenazopyridine (PYRIDIUM) 200 MG  tablet Take 1 tablet (200 mg total) by mouth 3 (three) times daily as needed for pain. Bladder pain 11/06/19   Joseph Pierini, MD  polyethylene glycol [facility] / Floria Raveling) packet Take 17 g by mouth daily. 06/23/16   [provider]  sacubitril-valsartan (ENTRESTO) 97-103 MG Take 1 tablet by mouth 2 (two) times daily. 09/30/20   Troy Sine, MD  spironolactone (ALDACTONE) 25 MG tablet Take 1/2 tablet ( 12.5 mg ) daily 09/30/20   Troy Sine, MD  zolpidem (AMBIEN) 10 MG tablet Take 1 tablet (10 mg total) by mouth at bedtime as needed. For sleep 08/29/17   Troy Sine, MD    ___________________________________________________________________________________________________ Physical Exam: Vitals with BMI 03/09/2021 03/09/2021 03/09/2021  Height - - -  Weight - - -  BMI - - -  Systolic 761 607 371  Diastolic 71 73 75  Pulse 77 83 80     1. General:  in No  Acute distress   Chronically ill   -appearing 2. Psychological: Alert and   Oriented 3. Head/ENT:     Dry Mucous Membranes                          Head Non traumatic, neck supple                          Poor Dentition 4. SKIN:  decreased Skin turgor,  Skin clean Dry and intact no rash 5. Heart: Regular rate and rhythm no  Murmur, no Rub or gallop 6. Lungs:  no wheezes or crackles   7. Abdomen: Soft,  epigastric tender, Non distended   obese  bowel sounds present 8. Lower extremities: no clubbing, cyanosis, no  edema 9. Neurologically Grossly intact, moving all 4 extremities equally  intact 10. MSK: Normal range of motion    Chart has been reviewed  ______________________________________________________________________________________________  Assessment/Plan 75 y.o. female with medical history significant of Lung Ca anemia, anxiety, CHF, DM2 , GERD, HLD. LLL lung mass with osseous metastasis   Admitted for possible postobstructive pneumonia  Present on Admission:  CAP (community acquired pneumonia) - - -Patient  presenting with   cough,   and infiltrate in   lower lobe on chest x-ray -Infiltrate on CXR and 2-3 characteristics ( leukocytosis, ) worrisome for pneumonia.     -  Suspect postobstructive has been evaluated at [facility] at that time abx were dc, given worsening WBC and CT scan worrisome for consolidation will give a trial. Of abx     will admit for treatment of postobstructive and HCAP will start on appropriate antibiotic coverage. -  zosyn/vanc   Obtain:  sputum cultures,                 influenza serologies negative                  COVID PCR negative                      blood cultures and sputum cultures ordered                   strep pneumo UA antigen,                     check for Legionella antigen.                Provide oxygen as needed.    Anemia -chronic but worsening obtain anemia panel most likely anemia of chronic disease given likely lung malignancy   Hyperlipemia - continue Lipitor    GERD (gastroesophageal reflux disease) - chronic continue home meds   CONSTIPATION, CHRONIC - bowel regimen as needed   Benign essential HTN - Stable continue home medications   Opioid abuse ([place]) -patient in the past have had poor response with fentanyl patches.  Inducing significant confusion and falls.  We will hold fentanyl patches.  Try to use p.o. pain medications   Lung mass -prior work-up consistent non-small cell lung cancer patient supposed to follow-up with oncology she says that she is aware that.  She does not wish to reestablish oncological care.  But would like to continue following up with [facility].  Prior to discharge would need to make sure patient has a pain appointment set up    COPD (chronic obstructive pulmonary disease) ([place]) -chronic stable resume home meds   Chronic diastolic CHF (congestive heart failure) ([place]) -currently appears to be slightly limited dry side resume home meds when able to tolerate  DM2 -   - Order Sensitive  SSI    - Hold by mouth  medications*  OSA - on CPAP ordered   Other plan as per orders.  DVT prophylaxis:   Lovenox      Code Status:    Code Status: DNR  DNR/DNI   as per patient   I had personally discussed CODE STATUS with patient and family     Family Communication:   Family not at  Bedside  plan of care was discussed on the phone with   Son,    Disposition Plan:     likely will need placement for rehabilitation   Following barriers for discharge:                            Electrolytes corrected                               Anemia stabnle                             Pain controlled with PO medications                                 white count improving able to transition to PO antibiotics  Will need to be able to tolerate PO                            Will likely need home health, home O2, set up                    Would benefit from PT/OT eval prior to DC  Ordered                                     Transition of care consulted                   Nutrition    consulted                                     Palliative care    consulted                                      Consults called: none  Admission status:  ED Disposition     ED Disposition  Admit   Condition  --   [place]: [place] [[id number]] Level of Care: Telemetry [5] Admit to tele based on following criteria: Other see comments Comments: sepsis May admit patient to [facility] or [facility] if equivalent level of care is avail able:: No Covid Evaluation: Symptomatic Person Under Investigation (PUI) Diagnosis: CAP (community acquired pneumonia) [[id number]] Admitting Physician: Toy Baker [[id number]] Attending Physician: Toy Baker [[id number]] Estimated length of s tay: past midnight tomorrow Certification:: I certify this patient will need inpatient services for at least 2 midnights           inpatient     I Expect 2 midnight stay secondary to  severity of patient's current illness need for inpatient interventions justified by the following:  hemodynamic instability despite optimal treatment (hypoxia )   Severe lab/radiological/exam abnormalities including:   Postobstructive pneumonia and extensive comorbidities including:   substance abuse   DM2      COPD/asthma     Malignancy   That are currently affecting medical management.   I expect  patient to be hospitalized for 2 midnights requiring inpatient medical care.  Patient is at high risk for adverse outcome (such as loss of life or disability) if not treated.  Indication for inpatient stay as follows:  Severe change from baseline regarding mental status   severe pain requiring acute inpatient management,     Need for IV antibiotics,      Level of care     tele  For  24H     Lab Results  Component Value Date   [place] NEGATIVE 03/09/2021     Precautions: admitted as  Covid Negative     PPE: Used by the provider:   N95  eye Goggles,  Gloves      Berdine Rasmusson 03/09/2021, 11:19 PM    [facility]     after 2 AM please page floor coverage PA If 7AM-7PM, please contact the day team taking care of the patient using [email or web address]   Patient was evaluated in the context of the global COVID-19 pandemic, which necessitated consideration that the patient might be at  risk for infection with the SARS-CoV-2 virus that causes COVID-19. Institutional protocols and algorithms that pertain to the evaluation of patients at risk for COVID-19 are in a state of rapid change based on information released by regulatory bodies including the CDC and federal and state organizations. These policies and algorithms were followed during the patient's care.

## 2021-03-09 NOTE — Progress Notes (Signed)
CSW is still waiting for PT to evaluate patient.

## 2021-03-10 DIAGNOSIS — I10 Essential (primary) hypertension: Secondary | ICD-10-CM | POA: Diagnosis not present

## 2021-03-10 DIAGNOSIS — J449 Chronic obstructive pulmonary disease, unspecified: Secondary | ICD-10-CM | POA: Diagnosis not present

## 2021-03-10 DIAGNOSIS — D649 Anemia, unspecified: Secondary | ICD-10-CM | POA: Diagnosis not present

## 2021-03-10 DIAGNOSIS — I5032 Chronic diastolic (congestive) heart failure: Secondary | ICD-10-CM | POA: Diagnosis not present

## 2021-03-10 LAB — CBC WITH DIFFERENTIAL/PLATELET
Abs Immature Granulocytes: 0.08 10*3/uL — ABNORMAL HIGH (ref 0.00–0.07)
Basophils Absolute: 0 10*3/uL (ref 0.0–0.1)
Basophils Relative: 0 %
Eosinophils Absolute: 0 10*3/uL (ref 0.0–0.5)
Eosinophils Relative: 0 %
HCT: 25.6 % — ABNORMAL LOW (ref 36.0–46.0)
Hemoglobin: 8.3 g/dL — ABNORMAL LOW (ref 12.0–15.0)
Immature Granulocytes: 1 %
Lymphocytes Relative: 10 %
Lymphs Abs: 1.7 10*3/uL (ref 0.7–4.0)
MCH: 23.4 pg — ABNORMAL LOW (ref 26.0–34.0)
MCHC: 32.4 g/dL (ref 30.0–36.0)
MCV: 72.3 fL — ABNORMAL LOW (ref 80.0–100.0)
Monocytes Absolute: 1.1 10*3/uL — ABNORMAL HIGH (ref 0.1–1.0)
Monocytes Relative: 7 %
Neutro Abs: 13.5 10*3/uL — ABNORMAL HIGH (ref 1.7–7.7)
Neutrophils Relative %: 82 %
Platelets: 391 10*3/uL (ref 150–400)
RBC: 3.54 MIL/uL — ABNORMAL LOW (ref 3.87–5.11)
RDW: 19.9 % — ABNORMAL HIGH (ref 11.5–15.5)
WBC: 16.5 10*3/uL — ABNORMAL HIGH (ref 4.0–10.5)
nRBC: 0 % (ref 0.0–0.2)

## 2021-03-10 LAB — GLUCOSE, CAPILLARY
Glucose-Capillary: 103 mg/dL — ABNORMAL HIGH (ref 70–99)
Glucose-Capillary: 106 mg/dL — ABNORMAL HIGH (ref 70–99)
Glucose-Capillary: 112 mg/dL — ABNORMAL HIGH (ref 70–99)
Glucose-Capillary: 131 mg/dL — ABNORMAL HIGH (ref 70–99)
Glucose-Capillary: 94 mg/dL (ref 70–99)
Glucose-Capillary: 99 mg/dL (ref 70–99)

## 2021-03-10 LAB — COMPREHENSIVE METABOLIC PANEL
ALT: 10 U/L (ref 0–44)
AST: 10 U/L — ABNORMAL LOW (ref 15–41)
Albumin: 2.2 g/dL — ABNORMAL LOW (ref 3.5–5.0)
Alkaline Phosphatase: 67 U/L (ref 38–126)
Anion gap: 7 (ref 5–15)
BUN: 9 mg/dL (ref 8–23)
CO2: 23 mmol/L (ref 22–32)
Calcium: 10.6 mg/dL — ABNORMAL HIGH (ref 8.9–10.3)
Chloride: 103 mmol/L (ref 98–111)
Creatinine, Ser: 0.57 mg/dL (ref 0.44–1.00)
GFR, Estimated: >60 mL/min (ref >60)
Glucose, Bld: 99 mg/dL (ref 70–99)
Potassium: 3.4 mmol/L — ABNORMAL LOW (ref 3.5–5.1)
Sodium: 133 mmol/L — ABNORMAL LOW (ref 135–145)
Total Bilirubin: 0.6 mg/dL (ref 0.3–1.2)
Total Protein: 6.3 g/dL — ABNORMAL LOW (ref 6.5–8.1)

## 2021-03-10 LAB — TYPE AND SCREEN
ABO/RH(D): O POS
Antibody Screen: NEGATIVE

## 2021-03-10 LAB — HEMOGLOBIN A1C
Hgb A1c MFr Bld: 5.7 % — ABNORMAL HIGH (ref 4.8–5.6)
Mean Plasma Glucose: 116.89 mg/dL

## 2021-03-10 LAB — URINE CULTURE: Culture: NO GROWTH

## 2021-03-10 LAB — MAGNESIUM: Magnesium: 1.8 mg/dL (ref 1.7–2.4)

## 2021-03-10 LAB — MRSA NEXT GEN BY PCR, NASAL: MRSA by PCR Next Gen: NOT DETECTED

## 2021-03-10 LAB — PROCALCITONIN: Procalcitonin: <0.1 ng/mL

## 2021-03-10 LAB — ABO/RH: ABO/RH(D): O POS

## 2021-03-10 LAB — PHOSPHORUS: Phosphorus: 2.4 mg/dL — ABNORMAL LOW (ref 2.5–4.6)

## 2021-03-10 LAB — TSH: TSH: 2.306 u[IU]/mL (ref 0.350–4.500)

## 2021-03-10 MED ORDER — GLUCERNA SHAKE PO LIQD
237.0000 mL | Freq: Three times a day (TID) | ORAL | Status: DC
  Administered 2021-03-10 – 2021-03-12 (×4): 237 mL via ORAL
  Filled 2021-03-10 (×8): qty 237

## 2021-03-10 MED ORDER — SODIUM CHLORIDE 0.9 % IV SOLN
6.2500 mg | Freq: Four times a day (QID) | INTRAVENOUS | Status: DC | PRN
  Administered 2021-03-10: 6.25 mg via INTRAVENOUS
  Filled 2021-03-10 (×4): qty 0.25

## 2021-03-10 MED ORDER — FENTANYL CITRATE PF 50 MCG/ML IJ SOSY
12.5000 ug | PREFILLED_SYRINGE | Freq: Once | INTRAMUSCULAR | Status: AC
  Administered 2021-03-10: 12.5 ug via INTRAVENOUS
  Filled 2021-03-10: qty 1

## 2021-03-10 MED ORDER — K PHOS MONO-SOD PHOS DI & MONO 155-852-130 MG PO TABS
500.0000 mg | ORAL_TABLET | Freq: Once | ORAL | Status: AC
  Administered 2021-03-10: 500 mg via ORAL
  Filled 2021-03-10: qty 2

## 2021-03-10 NOTE — Evaluation (Signed)
Occupational Therapy Evaluation Patient Details Name: Jamie Ayers MRN: [id number] DOB: January 07, 1946 Today's Date: 03/10/2021   History of Present Illness Patient is 75 y.o. female brought to ED 9/18 for abdominal pain that started a few days ago in the left lower quadrant without radiation but with associated nausea and increased confusion. Family reports pt has been unable to walk for the last week which is not her baseline and that she lives at home with her elderly husband who is unable to assist her. She recently underwent a thoracentesis at [facility] and recently diagnosed with non-small cell lung cancer. PMH significant for anemia, anxiety, CHF, DM, GERD, HLD.   Clinical Impression   Per chart review patient is from home with elderly spouse. Patient is oriented to person, place, time however inconsistently following directions/cues from OT with flat affect and needing increased time to answer questions. Patient needing mod A to sit up to edge of bed, then stating she needs to urinate. Attempted standing with walker, patient needing max multimodal cues for body mechanics, mod A from elevated bed height to stand. Patient with LE buckling therefore sat back onto bed and utilize stedy to get to commode. Patient trying to stand from stedy and becomes more anxious "I can't do this." Needs max reassurance. Of patient's own volition observe patient ability to use arms and lift buttock clean from bed to scoot up towards bed, needs mod A to lift legs onto bed. Due to impaired cognition, safety, strength, balance would recommend rehab before D/C home as patient is currently very high fall risk.      Recommendations for follow up therapy are one component of a multi-disciplinary discharge planning process, led by the attending physician.  Recommendations may be updated based on patient status, additional functional criteria and insurance authorization.   Follow Up Recommendations  SNF    Equipment  Recommendations  Other (comment) (unsure of patient's home equipment)       Precautions / Restrictions Precautions Precautions: Fall Precaution Comments: buckling in B UE/LEs Restrictions Weight Bearing Restrictions: No      Mobility Bed Mobility Overal bed mobility: Needs Assistance Bed Mobility: Supine to Sit;Sit to Supine     Supine to sit: Mod assist;HOB elevated Sit to supine: Mod assist   General bed mobility comments: poor task initiation. mod A for LE/trunk management to sit upright. Able to lay trunk down onto bed needing modA to lift legs onto bed    Transfers Overall transfer level: Needs assistance Equipment used: Rolling walker (2 wheeled) Transfers: Sit to/from [facility] Sit to Stand: Mod assist;+2 safety/equipment;From elevated surface Stand pivot transfers: Total assist       General transfer comment: please see toilet transfer in ADL section; high fall risk with LE buckling using walker therefore utilized stedy to get to/from [facility]    Balance Overall balance assessment: Needs assistance Sitting-balance support: Feet supported Sitting balance-Leahy Scale: Fair     Standing balance support: Bilateral upper extremity supported Standing balance-Leahy Scale: Zero Standing balance comment: use of stedy                           ADL either performed or assessed with clinical judgement   ADL Overall ADL's : Needs assistance/impaired     Grooming: Minimal assistance;Wash/dry face;Sitting Grooming Details (indicate cue type and reason): poor task initiation needing multimodal cues to even reach for wash cloth. max cues for thoroughness to wash food off  patient's face Upper Body Bathing: Moderate assistance;Sitting   Lower Body Bathing: Total assistance;Sitting/lateral leans   Upper Body Dressing : Moderate assistance;Sitting Upper Body Dressing Details (indicate cue type and reason): to assist OT in changing into clean gown,  limited UE strength with arms dropping at times Lower Body Dressing: Total assistance   Toilet Transfer: Total assistance;BSC Toilet Transfer Details (indicate cue type and reason): patient needing mod A x1-2 to power up to standing from elevated bed height then use of stedy to get to/from [facility]. Patient demonstrating LE buckling, poor direction following and letting go first of walker then of stedy during transfer liekly due to confusion patient repeatedly stating "I can't do it" high fall risk Toileting- Clothing Manipulation and Hygiene: Total assistance;Sitting/lateral lean;Bed level         General ADL Comments: patient with inconsistent direction following, appears somewhat self limiting stating "I can't do that" yet can push with arms and lift buttock clear off bed when scooting without prompting      Pertinent Vitals/Pain Pain Assessment: Faces Faces Pain Scale: Hurts little more Pain Location: generalized Pain Descriptors / Indicators: Crying Pain Intervention(s): Monitored during session     Hand Dominance Right   Extremity/Trunk Assessment Upper Extremity Assessment Upper Extremity Assessment: Generalized weakness;Difficult to assess due to impaired cognition (instruct patient to lift arms for UE assessment but inconsistently follows directions. also note sudden dropping of B UEs when instructed to hold onto stedy, walker, ect)   Lower Extremity Assessment Lower Extremity Assessment: Defer to PT evaluation       Communication Communication Communication: No difficulties   Cognition Arousal/Alertness: Awake/alert Behavior During Therapy: Flat affect Overall Cognitive Status: No family/caregiver present to determine baseline cognitive functioning                                 General Comments: limited initiation both verbally and physically needing questions repeated before answers OT. Will scoot up/initiate laying down into bed of own volition but  difficulty following or initiating directions to try and stand/transfer with OT. Is oriented to name of hospital, month, year.              Home Living Family/patient expects to be discharged to:: Skilled nursing facility Living Arrangements: Spouse/significant other                               Additional Comments: pt unable to provide any home environment info      Prior Functioning/Environment          Comments: patient is unreliable historian due to confusion however tells OT she does not need assist for basic ADLs nor uses AD for movility        OT Problem List: Decreased strength;Decreased activity tolerance;Impaired balance (sitting and/or standing);Decreased cognition;Decreased safety awareness;Obesity;Impaired UE functional use      OT Treatment/Interventions: Self-care/ADL training;Therapeutic exercise;DME and/or AE instruction;Therapeutic activities;Cognitive remediation/compensation;Patient/family education;Balance training    OT Goals(Current goals can be found in the care plan section) Acute Rehab OT Goals Patient Stated Goal: "I need to pee" OT Goal Formulation: With patient Time For Goal Achievement: 04/12/2021 Potential to Achieve Goals: Fair  OT Frequency: Min 2X/week    AM-PAC OT "6 Clicks" Daily Activity     Outcome Measure Help from another person eating meals?: A Little Help from another person taking care of personal grooming?: A  Little Help from another person toileting, which includes using toliet, bedpan, or urinal?: Total Help from another person bathing (including washing, rinsing, drying)?: A Lot Help from another person to put on and taking off regular upper body clothing?: A Lot Help from another person to put on and taking off regular lower body clothing?: Total 6 Click Score: 12   End of Session Equipment Utilized During Treatment: Gait belt;Rolling walker;Other (comment) (stedy) Nurse Communication: Mobility  status  Activity Tolerance: Patient limited by fatigue;Other (comment) (confusion) Patient left: in bed;with call bell/phone within reach;with bed alarm set  OT Visit Diagnosis: Unsteadiness on feet (R26.81);Other abnormalities of gait and mobility (R26.89);Muscle weakness (generalized) (M62.81);Other symptoms and signs involving cognitive function                Time: 5573-2202 OT Time Calculation (min): 38 min Charges:  OT General Charges $OT Visit: 1 Visit OT Evaluation $OT Eval Moderate Complexity: 1 Mod OT Treatments $Self Care/Home Management : 23-37 mins  Delbert Phenix OT OT pager: [place] 03/10/2021, 1:53 PM

## 2021-03-10 NOTE — TOC Progression Note (Addendum)
Transition of Care [facility]) - Progression Note    Patient Details  Name: CHARLIEE KRENZ MRN: [id number] Date of Birth: 05-Feb-1946  [facility] CM/SW Contact  Amin Fornwalt, Marjie Skiff, RN Phone Number: 03/10/2021, 2:35 PM  Clinical Narrative:    Spoke with pt at bedside for dc planning. Pt is slow to respond to questions and defers planning to her husband. Spoke with husband via phone to provide SNF bed offers. Medicare.gov ratings also provided to the husband. SNF bed list with ratings also left in pt room for son to pick up tonight when he visits. TOC will follow up tomorrow for SNF bed choice.    Expected Discharge Plan: [place] Barriers to Discharge: Continued Medical Work up  Expected Discharge Plan and Services Expected Discharge Plan: [place]   Discharge Planning Services: CM Consult Post Acute Care Choice: Bath arrangements for the past 2 months: Single Family Home                     Readmission Risk Interventions No flowsheet data found.

## 2021-03-10 NOTE — Progress Notes (Signed)
Initial Nutrition Assessment  DOCUMENTATION CODES:   Obesity unspecified  INTERVENTION:   -Glucerna Shake po TID, each supplement provides 220 kcal and 10 grams of protein  NUTRITION DIAGNOSIS:   Inadequate oral intake related to poor appetite as evidenced by per patient/family report.  GOAL:   Patient will meet greater than or equal to 90% of their needs  MONITOR:   PO intake, Supplement acceptance, Labs, Weight trends, I & O's  REASON FOR ASSESSMENT:   Consult Assessment of nutrition requirement/status  ASSESSMENT:   75 year old female with history of lung cancer followed at [facility], chronic systolic CHF, DM 2, hyperlipidemia who presents to the hospital with concern for postobstructive pneumonia.  She was brought to the ED on 9/17 due to mild confusion as well as nausea, poor p.o. intake.  Patient in room with lunch tray sitting at bedside. Pt states she does feel more hungry today but her appetite in general has been poor. Pt unable to answer some questions, a/o x 2. Pt denies swallowing/chewing difficulties. Pt likes Glucerna shakes, will order.  Per weight records, pt has lost 14 lbs since 2/16 (6% wt loss x 7 months, insignificant for time frame).   Medications: Miralax, KCl, Phenergan, IV Zofran  Labs reviewed: CBGs: 99-131 Low Na, K, Phos   NUTRITION - FOCUSED PHYSICAL EXAM:  No depletions noted.  Diet Order:   Diet Order             Diet Carb Modified Fluid consistency: Thin; Room service appropriate? Yes  Diet effective now                   EDUCATION NEEDS:   No education needs have been identified at this time  Skin:  Skin Assessment: Reviewed RN Assessment  Last BM:  9/19  Height:   Ht Readings from Last 1 Encounters:  03/09/21 5\' 5"  (1.651 m)    Weight:   Wt Readings from Last 1 Encounters:  03/09/21 89.9 kg    BMI:  Body mass index is 32.98 kg/m.  Estimated Nutritional Needs:   Kcal:  1650-1850  Protein:   75-90g  Fluid:  1.8L/day  Clayton Bibles, MS, RD, LDN Inpatient Clinical Dietitian Contact information available via Amion

## 2021-03-10 NOTE — Progress Notes (Signed)
PROGRESS NOTE  Jamie Ayers WIO:[id number] DOB: 06/22/1945 DOA: 03/08/2021 PCP: [name], MD   LOS: 1 day   Brief Narrative / Interim history: 75 year old female with history of lung cancer followed at [facility], chronic systolic CHF, DM 2, hyperlipidemia who presents to the hospital with concern for postobstructive pneumonia.  She was brought to the ED on 9/17 due to mild confusion as well as nausea, poor p.o. intake.  Apparently she lives alone and her husband is unable to help her. As per ER provider  It appears that since July patient has received multiple prescriptions for tramadol and oxycodone, additionally has 2 active prescriptions from 2 separate providers for fentanyl patches.  According to her sons, patient has been found at home to be placing more than 1 fentanyl patch on her body at a time, and they voiced concerns that she is "self-medicating".  She was given the ED for a few days and initial plan was patient to be placed in an SNF however she ended up being admitted due to leukocytosis and concern for postobstructive pneumonia on the CT scan, EDP felt she is unsafe for SNF discharge from the ED without further treatment  Subjective / 24h Interval events: She is doing well this morning, no nausea or vomiting.  She complains of cough but denies any shortness of breath.  No fever or chills.  She tells me 3 of her doctors at [facility] told her she does not have cancer but she needs to follow-up.  She seems a little bit confused on my interview.  Assessment & Plan: Principal Problem Postobstructive pneumonia-she underwent a CT scan of the abdomen and pelvis in the ED which showed a left lung base consolidation and small pleural effusion, also possible obstructing mass lesion.  There was concern for pneumonia given elevated WBC. -Clinically pneumonia seems less likely, but cannot be completely excluded.  Continue Zosyn for now and monitor cultures, if cultures remain negative may  consider a trial of observing off antibiotics. -Her leukocytosis appears to be chronic in nature and it was believed to be due to malignancy.  Chart review in care everywhere shows that her white count has been ranging between 13 - 23K for past few months.  -Of note, she was initially diagnosed with left lower lobe pneumonia earlier in the year, for which she has had already several courses of antibiotics in the last few months up until they found that it is actually a mass  Active Problems Non-small cell lung cancer-she underwent IR guided biopsy on 9/2 which showed non-small cell carcinoma, favoring squamous cell carcinoma.  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  Seeing cardiology as an outpatient.  This appears stable  DM2-continue sliding scale  CBG (last 3)  Recent Labs    03/09/21 2356 03/10/21 0518 03/10/21 0808  GLUCAP 117* 99 106*    Scheduled Meds:  aspirin EC  81 mg Oral Daily   atorvastatin  40 mg Oral Daily   budesonide  0.5 mg Nebulization BID   carvedilol  10 mg Oral Daily   enoxaparin (LOVENOX) injection  40 mg Subcutaneous Q24H   insulin aspart  0-9 Units Subcutaneous Q4H   montelukast  10 mg Oral QHS   pantoprazole  40 mg Oral Daily  phosphorus  500 mg Oral Once   polyethylene glycol  17 g Oral Daily   sacubitril-valsartan  1 tablet Oral BID   Continuous Infusions:  piperacillin-tazobactam (ZOSYN)  IV 12.5 mL/hr at 03/10/21 0317   promethazine (PHENERGAN) injection (IM or IVPB)     PRN Meds:.acetaminophen **OR** acetaminophen, albuterol, ondansetron (ZOFRAN) IV, oxyCODONE-acetaminophen, promethazine (PHENERGAN) injection (IM or IVPB)  Diet Orders (From admission, onward)     Start      Ordered   03/09/21 2233  Diet Carb Modified Fluid consistency: Thin; Room service appropriate? Yes  Diet effective now       Question Answer Comment  Diet-HS Snack? Nothing   Calorie Level Medium 1600-2000   Fluid consistency: Thin   Room service appropriate? Yes      03/09/21 2232            DVT prophylaxis: enoxaparin (LOVENOX) injection 40 mg Start: 03/10/21 1000     Code Status: DNR  Family Communication: No family at bedside  Status is: Inpatient  Remains inpatient appropriate because:Inpatient level of care appropriate due to severity of illness  Dispo: The patient is from: Home              Anticipated d/c is to: SNF              Patient currently is not medically stable to d/c.   Difficult to place patient No  Level of care: Telemetry  Consultants:  None  Procedures:  none  Microbiology  Blood cultures 9/19-pending Urine culture 9/19-no growth MRSA, PCR-negative Covid-negative  Antimicrobials: Zosyn 9/19-   Objective: Vitals:   03/10/21 0100 03/10/21 0243 03/10/21 0516 03/10/21 1013  BP:  (!) 193/97 (!) 167/74 (!) 157/77  Pulse:  86 80 80  Resp: (!) 26 16 14 18   Temp:  98.8 F (37.1 C) 98.9 F (37.2 C) 99.2 F (37.3 C)  TempSrc:  Oral Oral Oral  SpO2:  100% 99% 97%  Weight:      Height:        Intake/Output Summary (Last 24 hours) at 03/10/2021 1038 Last data filed at 03/10/2021 0852 Gross per 24 hour  Intake 366.46 ml  Output 725 ml  Net -358.54 ml   Filed Weights   03/09/21 2009 03/09/21 2221  Weight: 88 kg 89.9 kg    Examination:  Constitutional: NAD Eyes: no scleral icterus ENMT: Mucous membranes are moist.  Neck: normal, supple Respiratory: Diminished at the bases but overall clear, no wheezing, no rhonchi Cardiovascular: Regular rate and rhythm, no murmurs / rubs / gallops. No LE edema. Abdomen: non distended, no tenderness. Bowel sounds positive.  Musculoskeletal: no clubbing / cyanosis.  Skin: no  rashes Neurologic: Nonfocal  Data Reviewed: I have independently reviewed following labs and imaging studies   CBC: Recent Labs  Lab 03/08/21 2200 03/09/21 1836 03/10/21 0544  WBC 21.2* 17.9* 16.5*  NEUTROABS 18.1* 14.9* 13.5*  HGB 9.0* 8.3* 8.3*  HCT 28.9* 26.7* 25.6*  MCV 75.3* 74.0* 72.3*  PLT 399 429* 767   Basic Metabolic Panel: Recent Labs  Lab 03/08/21 2200 03/09/21 2015 03/10/21 0544  NA 132* 135 133*  K 4.3 3.4* 3.4*  CL 100 102 103  CO2 22 24 23   GLUCOSE 77 105* 99  BUN 10 10 9   CREATININE 0.82 0.70 0.57  CALCIUM 11.2* 10.9* 10.6*  MG  --  1.7 1.8  PHOS  --  2.6 2.4*   Liver Function Tests: Recent Labs  Lab 03/08/21 2200 03/09/21  2015 03/10/21 0544  AST 24 10* 10*  ALT 16 12 10   ALKPHOS 82 73 67  BILITOT 1.5* 0.6 0.6  PROT 7.6 6.9 6.3*  ALBUMIN 2.7* 2.5* 2.2*   Coagulation Profile: Recent Labs  Lab 03/09/21 2015  INR 1.1   HbA1C: Recent Labs    03/09/21 2249  HGBA1C 5.7*   CBG: Recent Labs  Lab 03/09/21 2356 03/10/21 0518 03/10/21 0808  GLUCAP 117* 99 106*    Recent Results (from the past 240 hour(s))  Urine Culture     Status: None   Collection Time: 03/09/21 10:19 AM   Specimen: Urine, Clean Catch  Result Value Ref Range Status   Specimen Description   Final    URINE, CLEAN CATCH Performed at [facility], [place] [address], [place], [place] [address]    Special Requests   Final    NONE Performed at [facility], [place] [address], [place], [place] [address]    Culture   Final    NO GROWTH Performed at [facility], [place] [address] [place], [place] [address]    Report Status 03/10/2021 FINAL  Final  Blood culture (routine x 2)     Status: None (Preliminary result)   Collection Time: 03/09/21  5:31 PM   Specimen: BLOOD  Result Value Ref Range Status   Specimen Description   Final    BLOOD LEFT ARM Performed at [facility] [address] [place],  [place] [address]    Special Requests   Final    BOTTLES DRAWN AEROBIC AND ANAEROBIC Blood Culture adequate volume Performed at [facility] [address], [place], [place] [address]    Culture   Final    NO GROWTH < 12 HOURS Performed at [facility] [address] [place], [place] [address]    Report Status PENDING  Incomplete  Resp Panel by RT-PCR (Flu A&B, Covid) Nasopharyngeal Swab     Status: None   Collection Time: 03/09/21  6:23 PM   Specimen: Nasopharyngeal Swab; Nasopharyngeal(NP) swabs in vial transport medium  Result Value Ref Range Status   SARS Coronavirus 2 by RT PCR NEGATIVE NEGATIVE Final    Comment: (NOTE) SARS-CoV-2 target nucleic acids are NOT DETECTED.  The SARS-CoV-2 RNA is generally detectable in upper respiratory specimens during the acute phase of infection. The lowest concentration of SARS-CoV-2 viral copies this assay can detect is 138 copies/mL. A negative result does not preclude SARS-Cov-2 infection and should not be used as the sole basis for treatment or other patient management decisions. A negative result may occur with  improper specimen collection/handling, submission of specimen other than nasopharyngeal swab, presence of viral mutation(s) within the areas targeted by this assay, and inadequate number of viral copies(<138 copies/mL). A negative result must be combined with clinical observations, patient history, and epidemiological information. The expected result is Negative.  Fact Sheet for Patients:  EntrepreneurPulse.com.au  Fact Sheet for Healthcare Providers:  IncredibleEmployment.be  This test is no t yet approved or cleared by the [place] FDA and  has been authorized for detection and/or diagnosis of SARS-CoV-2 by FDA under an Emergency Use Authorization (EUA). This EUA will remain  in effect (meaning this test can be used) for the duration of the COVID-19 declaration  under Section 564(b)(1) of the Act, 21 U.S.C.section 360bbb-3(b)(1), unless the authorization is terminated  or revoked sooner.       Influenza A by PCR NEGATIVE  NEGATIVE Final   Influenza B by PCR NEGATIVE NEGATIVE Final    Comment: (NOTE) The Xpert Xpress SARS-CoV-2/FLU/RSV plus assay is intended as an aid in the diagnosis of influenza from Nasopharyngeal swab specimens and should not be used as a sole basis for treatment. Nasal washings and aspirates are unacceptable for Xpert Xpress SARS-CoV-2/FLU/RSV testing.  Fact Sheet for Patients: EntrepreneurPulse.com.au  Fact Sheet for Healthcare Providers: IncredibleEmployment.be  This test is not yet approved or cleared by the [place] FDA and has been authorized for detection and/or diagnosis of SARS-CoV-2 by FDA under an Emergency Use Authorization (EUA). This EUA will remain in effect (meaning this test can be used) for the duration of the COVID-19 declaration under Section 564(b)(1) of the Act, 21 U.S.C. section 360bbb-3(b)(1), unless the authorization is terminated or revoked.  Performed at [facility], [place] [address], [place], [place] [address]   Blood culture (routine x 2)     Status: None (Preliminary result)   Collection Time: 03/09/21  6:38 PM   Specimen: BLOOD  Result Value Ref Range Status   Specimen Description   Final    BLOOD LEFT ARM Performed at [facility] [address] [place], [place] [address]    Special Requests   Final    BOTTLES DRAWN AEROBIC AND ANAEROBIC Blood Culture results may not be optimal due to an inadequate volume of blood received in culture bottles Performed at [facility] [address], [place], [place] [address]    Culture   Final    NO GROWTH < 12 HOURS Performed at [facility] [address] [place], [place] [address]    Report Status PENDING  Incomplete  MRSA Next Gen  by PCR, Nasal     Status: None   Collection Time: 03/09/21 11:02 PM   Specimen: Nasal Mucosa; Nasal Swab  Result Value Ref Range Status   MRSA by PCR Next Gen NOT DETECTED NOT DETECTED Final    Comment: (NOTE) The GeneXpert MRSA Assay (FDA approved for NASAL specimens only), is one component of a comprehensive MRSA colonization surveillance program. It is not intended to diagnose MRSA infection nor to guide or monitor treatment for MRSA infections. Test performance is not FDA approved in patients less than 99 years old. Performed at [facility], [place] [address] [place], [place] [address]      Radiology Studies: DG Chest Portable 1 View  Result Date: 03/09/2021 CLINICAL DATA:  Left lower lobe abnormal breath sounds on exam, vomiting EXAM: PORTABLE CHEST 1 VIEW COMPARISON:  11/03/2020 FINDINGS: Left lower lobe masslike consolidation noted in the retrocardiac region obscuring the left hemidiaphragm compatible with underlying lung mass. This is better demonstrated on the abdomen CT. By CT, there is chest wall involvement and osseous involvement of the left tenth rib. Small left effusion noted laterally. Heart is enlarged. Right lung remains clear. Degenerative changes of the spine. IMPRESSION: Large left lower lobe mass with chest wall and rib involvement better demonstrated by CT. Small lateral left effusion Cardiomegaly Electronically Signed   By: Jerilynn Mages.  Shick M.D.   On: 03/09/2021 11:28      Marzetta Board, MD, PhD [facility]  Between 7 am - 7 pm I am available, please contact me via Amion (for emergencies) or Securechat (non urgent messages)  Between 7 pm - 7 am I am not available, please contact night coverage MD/APP via Amion

## 2021-03-11 DIAGNOSIS — R918 Other nonspecific abnormal finding of lung field: Secondary | ICD-10-CM | POA: Diagnosis not present

## 2021-03-11 DIAGNOSIS — Z515 Encounter for palliative care: Secondary | ICD-10-CM | POA: Diagnosis not present

## 2021-03-11 DIAGNOSIS — J189 Pneumonia, unspecified organism: Secondary | ICD-10-CM | POA: Diagnosis not present

## 2021-03-11 DIAGNOSIS — I5032 Chronic diastolic (congestive) heart failure: Secondary | ICD-10-CM | POA: Diagnosis not present

## 2021-03-11 DIAGNOSIS — I10 Essential (primary) hypertension: Secondary | ICD-10-CM | POA: Diagnosis not present

## 2021-03-11 DIAGNOSIS — G893 Neoplasm related pain (acute) (chronic): Secondary | ICD-10-CM | POA: Diagnosis not present

## 2021-03-11 DIAGNOSIS — J449 Chronic obstructive pulmonary disease, unspecified: Secondary | ICD-10-CM | POA: Diagnosis not present

## 2021-03-11 LAB — COMPREHENSIVE METABOLIC PANEL
ALT: 11 U/L (ref 0–44)
AST: 12 U/L — ABNORMAL LOW (ref 15–41)
Albumin: 2.3 g/dL — ABNORMAL LOW (ref 3.5–5.0)
Alkaline Phosphatase: 84 U/L (ref 38–126)
Anion gap: 7 (ref 5–15)
BUN: 9 mg/dL (ref 8–23)
CO2: 24 mmol/L (ref 22–32)
Calcium: 10.4 mg/dL — ABNORMAL HIGH (ref 8.9–10.3)
Chloride: 102 mmol/L (ref 98–111)
Creatinine, Ser: 0.73 mg/dL (ref 0.44–1.00)
GFR, Estimated: >60 mL/min (ref >60)
Glucose, Bld: 100 mg/dL — ABNORMAL HIGH (ref 70–99)
Potassium: 3.3 mmol/L — ABNORMAL LOW (ref 3.5–5.1)
Sodium: 133 mmol/L — ABNORMAL LOW (ref 135–145)
Total Bilirubin: 0.7 mg/dL (ref 0.3–1.2)
Total Protein: 6.7 g/dL (ref 6.5–8.1)

## 2021-03-11 LAB — CBC
HCT: 28.1 % — ABNORMAL LOW (ref 36.0–46.0)
Hemoglobin: 8.9 g/dL — ABNORMAL LOW (ref 12.0–15.0)
MCH: 23.2 pg — ABNORMAL LOW (ref 26.0–34.0)
MCHC: 31.7 g/dL (ref 30.0–36.0)
MCV: 73.4 fL — ABNORMAL LOW (ref 80.0–100.0)
Platelets: 396 10*3/uL (ref 150–400)
RBC: 3.83 MIL/uL — ABNORMAL LOW (ref 3.87–5.11)
RDW: 19.8 % — ABNORMAL HIGH (ref 11.5–15.5)
WBC: 17.7 10*3/uL — ABNORMAL HIGH (ref 4.0–10.5)
nRBC: 0 % (ref 0.0–0.2)

## 2021-03-11 LAB — GLUCOSE, CAPILLARY
Glucose-Capillary: 104 mg/dL — ABNORMAL HIGH (ref 70–99)
Glucose-Capillary: 153 mg/dL — ABNORMAL HIGH (ref 70–99)
Glucose-Capillary: 89 mg/dL (ref 70–99)
Glucose-Capillary: 90 mg/dL (ref 70–99)
Glucose-Capillary: 93 mg/dL (ref 70–99)
Glucose-Capillary: 99 mg/dL (ref 70–99)

## 2021-03-11 MED ORDER — AMOXICILLIN-POT CLAVULANATE 875-125 MG PO TABS
1.0000 | ORAL_TABLET | Freq: Two times a day (BID) | ORAL | Status: DC
  Administered 2021-03-11 – 2021-03-12 (×3): 1 via ORAL
  Filled 2021-03-11 (×3): qty 1

## 2021-03-11 MED ORDER — LIP MEDEX EX OINT: TOPICAL_OINTMENT | CUTANEOUS | Status: DC | PRN

## 2021-03-11 MED ORDER — LIP MEDEX EX OINT
TOPICAL_OINTMENT | CUTANEOUS | Status: AC
  Administered 2021-03-11: 1
  Filled 2021-03-11: qty 7

## 2021-03-11 MED ORDER — POTASSIUM CHLORIDE CRYS ER 20 MEQ PO TBCR
40.0000 meq | EXTENDED_RELEASE_TABLET | Freq: Once | ORAL | Status: AC
  Administered 2021-03-11: 40 meq via ORAL
  Filled 2021-03-11: qty 2

## 2021-03-11 MED ORDER — FENTANYL CITRATE PF 50 MCG/ML IJ SOSY
25.0000 ug | PREFILLED_SYRINGE | Freq: Once | INTRAMUSCULAR | Status: AC
  Administered 2021-03-11: 25 ug via INTRAVENOUS
  Filled 2021-03-11: qty 1

## 2021-03-11 NOTE — TOC Progression Note (Signed)
Transition of Care [facility]) - Progression Note    Patient Details  Name: Jamie Ayers MRN: [id number] Date of Birth: [birth year]  [facility]) CM/SW Contact  Tamber Burtch, Marjie Skiff, RN Phone Number: 03/11/2021, 12:31 PM  Clinical Narrative:    Spoke with husband again via phone. He has decided to choose [facility] as his first choice. He was informed about an additional SNF bed offer received today and wants to stick with [facility]. He said he has discussed this with his sons as well. TOC will reach out to [facility] about securing a bed for pt.   Expected Discharge Plan: [place] Barriers to Discharge: Continued Medical Work up  Expected Discharge Plan and Services Expected Discharge Plan: [place]   Discharge Planning Services: CM Consult Post Acute Care Choice: [place] arrangements for the past 2 months: Single Family Home                     Readmission Risk Interventions No flowsheet data found.

## 2021-03-11 NOTE — Progress Notes (Signed)
PROGRESS NOTE    Jamie Ayers  HUD:[id number] DOB: [birth year] DOA: 03/08/2021 PCP: [name], MD    Chief Complaint  Patient presents with   Abdominal Pain    Brief Narrative:  75 year old female with history of lung cancer followed at [facility], chronic systolic CHF, DM 2, hyperlipidemia who presents to the hospital with concern for postobstructive pneumonia.  She was brought to the ED on 9/17 due to mild confusion as well as nausea, poor p.o. intake.  Apparently she lives alone and her husband is unable to help her. As per ER provider  It appears that since July patient has received multiple prescriptions for tramadol and oxycodone, additionally has 2 active prescriptions from 2 separate providers for fentanyl patches.  According to her sons, patient has been found at home to be placing more than 1 fentanyl patch on her body at a time, and they voiced concerns that she is "self-medicating".  She was given the ED for a few days and initial plan was patient to be placed in an SNF however she ended up being admitted due to leukocytosis and concern for postobstructive pneumonia on the CT scan, EDP felt she is unsafe for SNF discharge from the ED without further treatment.  Assessment & Plan:   Active Problems:   CONSTIPATION, CHRONIC   Benign essential HTN   GERD (gastroesophageal reflux disease)   Hyperlipemia   OSA on CPAP   CAP (community acquired pneumonia)   Anemia   Opioid abuse (HCC)   Lung mass   COPD (chronic obstructive pulmonary disease) (HCC)   Chronic diastolic CHF (congestive heart failure) (HCC)   DM2 (diabetes mellitus, type 2) (HCC)   Postobstructive pneumonia CT of the abdomen pelvis done in ED showed left lung base consolidation and small pleural effusion there was a concern for pneumonia given elevated WBC. She was initially started on Zosyn, transition to oral Augmentin to complete the course She appears to have persistent leukocytosis, possibly from her  non-small cell lung cancer   Non-small cell lung cancer s/p IR guided biopsy on 02/20/2021 which showed non-small cell lung cancer.  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   Hypokalemia and hypomagnesemia Replaced   Hyponatremia:  Mild.    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  In December 2005 her ejection fraction was 20%. With aggressive medical therapy LV function normalized to approximately 55%. Outpatient follow-up with cardiology as needed. Continue with Entresto, aspirin, Coreg, Lipitor.  COPD No wheezing heard on exam today.  Bronchodilators as needed, continue with Pulmicort   Obstructive sleep apnea on CPAP continue the same.  Non-insulin-dependent diabetes mellitus Continue with sliding scale insulin while inpatient Patient is on metformin Jardiance and glipizide at home     DVT prophylaxis: Lovenox Code Status: DNR Family Communication: None at bedside Disposition:   Status is: Inpatient  Remains inpatient appropriate because:Unsafe d/c plan  Dispo: The patient is from: Home              Anticipated d/c is to: SNF              Patient currently is medically stable to d/c.   Difficult to place patient Yes       Consultants:  None.   Procedures: none.   Antimicrobials:  Antibiotics Given (last  72 hours)     Date/Time Action Medication Dose Rate   03/09/21 1837 New Bag/Given   ceFEPIme (MAXIPIME) 2 g in sodium chloride 0.9 % 100 mL IVPB 2 g 200 mL/hr   03/09/21 1852 New Bag/Given   vancomycin (VANCOCIN) IVPB 1000 mg/200 mL premix 1,000 mg 200 mL/hr   03/09/21 2031 New Bag/Given   vancomycin (VANCOREADY) IVPB 750 mg/150 mL 750 mg 150 mL/hr   03/10/21 0243 New Bag/Given   piperacillin-tazobactam (ZOSYN) IVPB 3.375  g 3.375 g 12.5 mL/hr   03/10/21 1045 New Bag/Given   piperacillin-tazobactam (ZOSYN) IVPB 3.375 g 3.375 g 12.5 mL/hr   03/10/21 1708 New Bag/Given   piperacillin-tazobactam (ZOSYN) IVPB 3.375 g 3.375 g 12.5 mL/hr   03/11/21 0141 New Bag/Given   piperacillin-tazobactam (ZOSYN) IVPB 3.375 g 3.375 g 12.5 mL/hr   03/11/21 1018 Given   amoxicillin-clavulanate (AUGMENTIN) 875-125 MG per tablet 1 tablet 1 tablet          Subjective: No new complaints, on RA.   Objective: Vitals:   03/10/21 1354 03/10/21 1934 03/10/21 2012 03/11/21 0501  BP: (!) 166/99  (!) 177/79 (!) 158/76  Pulse: 79  83 86  Resp: 18  16 16   Temp: 98.1 F (36.7 C)  98 F (36.7 C) 98 F (36.7 C)  TempSrc: Oral  Oral Oral  SpO2: 97% 96% 99% 98%  Weight:      Height:        Intake/Output Summary (Last 24 hours) at 03/11/2021 1228 Last data filed at 03/11/2021 1100 Gross per 24 hour  Intake 620.43 ml  Output 1150 ml  Net -529.57 ml   Filed Weights   03/09/21 2009 03/09/21 2221  Weight: 88 kg 89.9 kg    Examination:  General exam: Appears calm and comfortable on RA Respiratory system: DIMINISHED at bases, no wheezing or rhonchi.  Cardiovascular system: S1 & S2 heard, RRR. Marland Kitchen No pedal edema. Gastrointestinal system: Abdomen is nondistended, soft and nontender.  Normal bowel sounds heard. Central nervous system: Alert and oriented. No focal neurological deficits. Extremities: Symmetric 5 x 5 power. Skin: No rashes, lesions or ulcers Psychiatry: Mood & affect appropriate.     Data Reviewed: I have personally reviewed following labs and imaging studies  CBC: Recent Labs  Lab 03/08/21 2200 03/09/21 1836 03/10/21 0544 03/11/21 0511  WBC 21.2* 17.9* 16.5* 17.7*  NEUTROABS 18.1* 14.9* 13.5*  --   HGB 9.0* 8.3* 8.3* 8.9*  HCT 28.9* 26.7* 25.6* 28.1*  MCV 75.3* 74.0* 72.3* 73.4*  PLT 399 429* 391 540    Basic Metabolic Panel: Recent Labs  Lab 03/08/21 2200 03/09/21 2015 03/10/21 0544  03/11/21 0511  NA 132* 135 133* 133*  K 4.3 3.4* 3.4* 3.3*  CL 100 102 103 102  CO2 22 24 23 24   GLUCOSE 77 105* 99 100*  BUN 10 10 9 9   CREATININE 0.82 0.70 0.57 0.73  CALCIUM 11.2* 10.9* 10.6* 10.4*  MG  --  1.7 1.8  --   PHOS  --  2.6 2.4*  --     GFR: Estimated Creatinine Clearance: 67.3 mL/min (by C-G formula based on SCr of 0.73 mg/dL).  Liver Function Tests: Recent Labs  Lab 03/08/21 2200 03/09/21 2015 03/10/21 0544 03/11/21 0511  AST 24 10* 10* 12*  ALT 16 12 10 11   ALKPHOS 82 73 67 84  BILITOT 1.5* 0.6 0.6 0.7  PROT 7.6 6.9 6.3* 6.7  ALBUMIN 2.7* 2.5* 2.2* 2.3*    CBG: Recent Labs  Lab 03/10/21 2011 03/10/21 2352 03/11/21 0503 03/11/21 0730 03/11/21 1147  GLUCAP 94 112* 99 89 153*     Recent Results (from the past 240 hour(s))  Urine Culture     Status: None   Collection Time: 03/09/21 10:19 AM   Specimen: Urine, Clean Catch  Result Value Ref Range Status   Specimen Description   Final    URINE, CLEAN CATCH Performed at [facility], [place] [address], [place], [place] [address]    Special Requests   Final    NONE Performed at [facility], [place] [address], [place], [place] [address]    Culture   Final    NO GROWTH Performed at [facility], [place] [address], [place], [place] [address]    Report Status 03/10/2021 FINAL  Final  Blood culture (routine x 2)     Status: None (Preliminary result)   Collection Time: 03/09/21  5:31 PM   Specimen: BLOOD  Result Value Ref Range Status   Specimen Description   Final    BLOOD LEFT ARM Performed at [facility] [address] [place], [place] [address]    Special Requests   Final    BOTTLES DRAWN AEROBIC AND ANAEROBIC Blood Culture adequate volume Performed at [facility] [address], [place], [place] [address]    Culture   Final    NO GROWTH 2 DAYS Performed at [place] [address]  [place],  [address]    Report Status PENDING  Incomplete  Resp Panel by RT-PCR (Flu A&B, Covid) Nasopharyngeal Swab     Status: None   Collection Time: 03/09/21  6:23 PM   Specimen: Nasopharyngeal Swab; Nasopharyngeal(NP) swabs in vial transport medium  Result Value Ref Range Status   SARS Coronavirus 2 by RT PCR NEGATIVE NEGATIVE Final    Comment: (NOTE) SARS-CoV-2 target nucleic acids are NOT DETECTED.  The SARS-CoV-2 RNA is generally detectable in upper respiratory specimens during the acute phase of infection. The lowest concentration of SARS-CoV-2 viral copies this assay can detect is 138 copies/mL. A negative result does not preclude SARS-Cov-2 infection and should not be used as the sole basis for treatment or other patient management decisions. A negative result may occur with  improper specimen collection/handling, submission of specimen other than nasopharyngeal swab, presence of viral mutation(s) within the areas targeted by this assay, and inadequate number of viral copies(<138 copies/mL). A negative result must be combined with clinical observations, patient history, and epidemiological information. The expected result is Negative.  Fact Sheet for Patients:  EntrepreneurPulse.com.au  Fact Sheet for Healthcare Providers:  IncredibleEmployment.be  This test is no t yet approved or cleared by the [place] FDA and  has been authorized for detection and/or diagnosis of SARS-CoV-2 by FDA under an Emergency Use Authorization (EUA). This EUA will remain  in effect (meaning this test can be used) for the duration of the COVID-19 declaration under Section 564(b)(1) of the Act, 21 U.S.C.section 360bbb-3(b)(1), unless the authorization is terminated  or revoked sooner.       Influenza A by PCR NEGATIVE NEGATIVE Final   Influenza B by PCR NEGATIVE NEGATIVE Final    Comment: (NOTE) The Xpert Xpress SARS-CoV-2/FLU/RSV plus assay is  intended as an aid in the diagnosis of influenza from Nasopharyngeal swab specimens and should not be used as a sole basis for treatment. Nasal washings and aspirates are unacceptable for Xpert Xpress SARS-CoV-2/FLU/RSV testing.  Fact Sheet for Patients: EntrepreneurPulse.com.au  Fact Sheet for Healthcare Providers: IncredibleEmployment.be  This test is not yet approved or cleared by the [place] FDA and has been authorized for detection and/or diagnosis of SARS-CoV-2 by FDA under an Emergency Use Authorization (EUA). This EUA will remain in effect (meaning this test can be used) for the duration of the COVID-19 declaration under Section 564(b)(1) of the Act, 21 U.S.C. section 360bbb-3(b)(1), unless the authorization is terminated or revoked.  Performed at [facility], [place] [address] [place], [place] [address]   Blood culture (routine x 2)     Status: None (Preliminary result)   Collection Time: 03/09/21  6:38 PM   Specimen: BLOOD  Result Value Ref Range Status   Specimen Description   Final    BLOOD LEFT ARM Performed at [facility] [address] [place], [place] [address]    Special Requests   Final    BOTTLES DRAWN AEROBIC AND ANAEROBIC Blood Culture results may not be optimal due to an inadequate volume of blood received in culture bottles Performed at [facility] [address] [place], [place] [address]    Culture   Final    NO GROWTH 2 DAYS Performed at [facility] [address], [place], [place] [address]    Report Status PENDING  Incomplete  MRSA Next Gen by PCR, Nasal     Status: None   Collection Time: 03/09/21 11:02 PM   Specimen: Nasal Mucosa; Nasal Swab  Result Value Ref Range Status   MRSA by PCR Next Gen NOT DETECTED NOT DETECTED Final    Comment: (NOTE) The GeneXpert MRSA Assay (FDA approved for NASAL specimens only), is one component of a  comprehensive MRSA colonization surveillance program. It is not intended to diagnose MRSA infection nor to guide or monitor treatment for MRSA infections. Test performance is not FDA approved in patients less than 25 years old. Performed at [facility], [place] [address] [place], [place] [address]          Radiology Studies: No results found.      Scheduled Meds:  amoxicillin-clavulanate  1 tablet Oral Q12H   aspirin EC  81 mg Oral Daily   atorvastatin  40 mg Oral Daily   budesonide  0.5 mg Nebulization BID   carvedilol  10 mg Oral Daily   enoxaparin (LOVENOX) injection  40 mg Subcutaneous Q24H   feeding supplement (GLUCERNA SHAKE)  237 mL Oral TID BM   insulin aspart  0-9 Units Subcutaneous Q4H   montelukast  10 mg Oral QHS   pantoprazole  40 mg Oral Daily   polyethylene glycol  17 g Oral Daily   sacubitril-valsartan  1 tablet Oral BID   Continuous Infusions:  promethazine (PHENERGAN) injection (IM or IVPB) 6.25 mg (03/10/21 1038)     LOS: 2 days       Hosie Poisson, MD [facility]   To contact the attending provider between 7A-7P or the covering provider during after hours 7P-7A, please log into the web site [email or web address] and access using universal [place] password for that web site. If you do not have the password, please call the hospital operator.  03/11/2021, 12:28 PM

## 2021-03-11 NOTE — Progress Notes (Addendum)
Physical Therapy Treatment Patient Details Name: Jamie Ayers MRN: [id number] DOB: 1946/04/11 Today's Date: 03/11/2021   History of Present Illness Patient is 75 y.o. female brought to ED 9/18 for abdominal pain that started a few days ago in the left lower quadrant without radiation but with associated nausea and increased confusion. Family reports pt has been unable to walk for the last week which is not her baseline and that she lives at home with her elderly husband who is unable to assist her. She recently underwent a thoracentesis at [facility] and recently diagnosed with non-small cell lung cancer. Dx of CAP.  PMH significant for anemia, anxiety, CHF, DM, GERD, HLD.    PT Comments    Pt teary throughout PT session, repeatedly stated, "they wouldn't give me any medicine last night". Pt c/o 8/10 back pain, RN notified of pt request for pain medicine. Pt is slow to respond to commands, at times doesn't seem to process commands as when attempting to instruct pt in seated LE strengthening exercises there was no response. Min A for sit to stand, min A to ambulate 4' with RW, distance limited by BLE fatigue (she began to sit without there being a chair behind her).     Recommendations for follow up therapy are one component of a multi-disciplinary discharge planning process, led by the attending physician.  Recommendations may be updated based on patient status, additional functional criteria and insurance authorization.  Follow Up Recommendations  SNF     Equipment Recommendations  None recommended by PT    Recommendations for Other Services       Precautions / Restrictions Precautions Precautions: Fall Precaution Comments: buckling in B UE/LEs Restrictions Weight Bearing Restrictions: No     Mobility  Bed Mobility               General bed mobility comments: up in recliner    Transfers Overall transfer level: Needs assistance Equipment used: Rolling walker (2  wheeled) Transfers: Sit to/from Stand Sit to Stand: Min assist         General transfer comment: sit to stand from recliner with min A to power up, increased time plus repeated verbal/manual cuesfor pt to follow commands for safe hand placement, noted buckling of arms as she pushed up from arm rests  Ambulation/Gait Ambulation/Gait assistance: Min assist Gait Distance (Feet): 4 Feet Assistive device: Rolling walker (2 wheeled) Gait Pattern/deviations: Decreased stride length;Decreased step length - right;Decreased step length - left;Step-through pattern;Shuffle;Trunk flexed     General Gait Details: LEs began to fatigue and pt began to flex knees/hips so brought recliner up to her   Stairs             Wheelchair Mobility    Modified Rankin (Stroke Patients Only)       Balance Overall balance assessment: Needs assistance Sitting-balance support: Feet supported;Bilateral upper extremity supported Sitting balance-Leahy Scale: Fair     Standing balance support: During functional activity;Bilateral upper extremity supported Standing balance-Leahy Scale: Poor                              Cognition Arousal/Alertness: Awake/alert Behavior During Therapy: Anxious Overall Cognitive Status: No family/caregiver present to determine baseline cognitive functioning                                 General Comments: pt crying during  much of the session, she repeatedly stated, "they wouldn't give me any medicine last night"      Exercises      General Comments        Pertinent Vitals/Pain Pain Assessment: 0-10 Pain Score: 8  Pain Location: back Pain Descriptors / Indicators: Crying Pain Intervention(s): Limited activity within patient's tolerance;Monitored during session;Patient requesting pain meds-RN notified;Repositioned    Home Living                      Prior Function            PT Goals (current goals can now be found  in the care plan section) Acute Rehab PT Goals PT Goal Formulation: Patient unable to participate in goal setting Time For Goal Achievement: 03/23/21 Potential to Achieve Goals: Good Progress towards PT goals: Not progressing toward goals - comment    Frequency    Min 2X/week      PT Plan Current plan remains appropriate    Co-evaluation              AM-PAC PT "6 Clicks" Mobility   Outcome Measure  Help needed turning from your back to your side while in a flat bed without using bedrails?: A Little Help needed moving from lying on your back to sitting on the side of a flat bed without using bedrails?: A Little Help needed moving to and from a bed to a chair (including a wheelchair)?: A Little Help needed standing up from a chair using your arms (e.g., wheelchair or bedside chair)?: A Little Help needed to walk in hospital room?: A Lot Help needed climbing 3-5 steps with a railing? : A Lot 6 Click Score: 16    End of Session Equipment Utilized During Treatment: Gait belt Activity Tolerance: Patient limited by fatigue Patient left: in chair;with call bell/phone within reach;with chair alarm set Nurse Communication: Mobility status PT Visit Diagnosis: Muscle weakness (generalized) (M62.81);Difficulty in walking, not elsewhere classified (R26.2)     Time: 3382-5053 PT Time Calculation (min) (ACUTE ONLY): 15 min  Charges:  $Therapeutic Activity: 8-22 mins                     Blondell Reveal Kistler PT 03/11/2021  Acute Rehabilitation Services Pager [phone] Office [phone]

## 2021-03-12 ENCOUNTER — Encounter (HOSPITAL_COMMUNITY): Payer: Self-pay

## 2021-03-12 ENCOUNTER — Emergency Department (HOSPITAL_COMMUNITY)
Admission: EM | Admit: 2021-03-12 | Discharge: 2021-03-13 | Disposition: A | Discharge status: Home / Self Care | Payer: Medicare Other | Attending: Emergency Medicine | Admitting: Emergency Medicine

## 2021-03-12 ENCOUNTER — Other Ambulatory Visit: Payer: Self-pay

## 2021-03-12 DIAGNOSIS — G893 Neoplasm related pain (acute) (chronic): Secondary | ICD-10-CM | POA: Diagnosis not present

## 2021-03-12 DIAGNOSIS — J449 Chronic obstructive pulmonary disease, unspecified: Secondary | ICD-10-CM | POA: Diagnosis not present

## 2021-03-12 DIAGNOSIS — I110 Hypertensive heart disease with heart failure: Secondary | ICD-10-CM | POA: Diagnosis not present

## 2021-03-12 DIAGNOSIS — J189 Pneumonia, unspecified organism: Secondary | ICD-10-CM | POA: Diagnosis not present

## 2021-03-12 DIAGNOSIS — T7840XA Allergy, unspecified, initial encounter: Secondary | ICD-10-CM | POA: Diagnosis present

## 2021-03-12 DIAGNOSIS — R918 Other nonspecific abnormal finding of lung field: Secondary | ICD-10-CM | POA: Insufficient documentation

## 2021-03-12 DIAGNOSIS — E119 Type 2 diabetes mellitus without complications: Secondary | ICD-10-CM | POA: Insufficient documentation

## 2021-03-12 DIAGNOSIS — Z79899 Other long term (current) drug therapy: Secondary | ICD-10-CM | POA: Diagnosis not present

## 2021-03-12 DIAGNOSIS — I5032 Chronic diastolic (congestive) heart failure: Secondary | ICD-10-CM | POA: Insufficient documentation

## 2021-03-12 DIAGNOSIS — Z8616 Personal history of COVID-19: Secondary | ICD-10-CM | POA: Diagnosis not present

## 2021-03-12 DIAGNOSIS — Z7982 Long term (current) use of aspirin: Secondary | ICD-10-CM | POA: Insufficient documentation

## 2021-03-12 DIAGNOSIS — I10 Essential (primary) hypertension: Secondary | ICD-10-CM | POA: Diagnosis not present

## 2021-03-12 DIAGNOSIS — Z515 Encounter for palliative care: Secondary | ICD-10-CM | POA: Diagnosis not present

## 2021-03-12 LAB — PHOSPHORUS: Phosphorus: 1.9 mg/dL — ABNORMAL LOW (ref 2.5–4.6)

## 2021-03-12 LAB — BASIC METABOLIC PANEL
Anion gap: 7 (ref 5–15)
BUN: 8 mg/dL (ref 8–23)
CO2: 25 mmol/L (ref 22–32)
Calcium: 10.7 mg/dL — ABNORMAL HIGH (ref 8.9–10.3)
Chloride: 103 mmol/L (ref 98–111)
Creatinine, Ser: 0.69 mg/dL (ref 0.44–1.00)
GFR, Estimated: >60 mL/min (ref >60)
Glucose, Bld: 99 mg/dL (ref 70–99)
Potassium: 3.7 mmol/L (ref 3.5–5.1)
Sodium: 135 mmol/L (ref 135–145)

## 2021-03-12 LAB — RESP PANEL BY RT-PCR (FLU A&B, COVID) ARPGX2
Influenza A by PCR: NEGATIVE
Influenza B by PCR: NEGATIVE
SARS Coronavirus 2 by RT PCR: NEGATIVE

## 2021-03-12 LAB — GLUCOSE, CAPILLARY
Glucose-Capillary: 93 mg/dL (ref 70–99)
Glucose-Capillary: 84 mg/dL (ref 70–99)
Glucose-Capillary: 126 mg/dL — ABNORMAL HIGH (ref 70–99)

## 2021-03-12 MED ORDER — NITROFURANTOIN MACROCRYSTAL 50 MG PO CAPS: 50.0000 mg | ORAL_CAPSULE | Freq: Every day | ORAL | Status: AC

## 2021-03-12 MED ORDER — POTASSIUM & SODIUM PHOSPHATES 280-160-250 MG PO PACK: 1.0000 | PACK | Freq: Three times a day (TID) | ORAL | 0 refills | Status: AC

## 2021-03-12 MED ORDER — GLUCERNA SHAKE PO LIQD: 237.0000 mL | Freq: Three times a day (TID) | ORAL | 0 refills | Status: AC

## 2021-03-12 MED ORDER — FOLIC ACID 1 MG PO TABS: 1.0000 mg | ORAL_TABLET | Freq: Every day | ORAL | 2 refills | Status: AC

## 2021-03-12 MED ORDER — AMOXICILLIN-POT CLAVULANATE 875-125 MG PO TABS: 1.0000 | ORAL_TABLET | Freq: Two times a day (BID) | ORAL | 0 refills | Status: AC

## 2021-03-12 MED ORDER — POTASSIUM & SODIUM PHOSPHATES 280-160-250 MG PO PACK
1.0000 | PACK | Freq: Three times a day (TID) | ORAL | Status: DC
  Administered 2021-03-12: 1 via ORAL
  Filled 2021-03-12 (×2): qty 1

## 2021-03-12 MED ORDER — EMPAGLIFLOZIN 10 MG PO TABS: 10.0000 mg | ORAL_TABLET | Freq: Every day | ORAL | Status: AC

## 2021-03-12 MED ORDER — CARVEDILOL PHOSPHATE ER 40 MG PO CP24: 40.0000 mg | ORAL_CAPSULE | Freq: Every day | ORAL | 3 refills | Status: AC

## 2021-03-12 MED ORDER — OXYCODONE-ACETAMINOPHEN 5-325 MG PO TABS: 1.0000 | ORAL_TABLET | Freq: Two times a day (BID) | ORAL | 0 refills | Status: AC | PRN

## 2021-03-12 MED ORDER — FOLIC ACID 1 MG PO TABS
1.0000 mg | ORAL_TABLET | Freq: Every day | ORAL | Status: DC
  Administered 2021-03-12: 1 mg via ORAL
  Filled 2021-03-12: qty 1

## 2021-03-12 MED ORDER — BUSPIRONE HCL 15 MG PO TABS: 7.5000 mg | ORAL_TABLET | Freq: Two times a day (BID) | ORAL | 0 refills | Status: AC

## 2021-03-12 NOTE — Consult Note (Signed)
Consultation Note Date: 03/12/2021   Patient Name: Jamie Ayers  DOB: 11-06-1945  MRN: [id number]  Age / Sex: 75 y.o., female  PCP: [name], MD Referring Physician: Hosie Poisson, MD  Reason for Consultation: Establishing goals of care  HPI/Patient Profile: 75 y.o. female  with past medical history of recently diagnosed lung cancer followed at [facility], chronic systolic CHF, DM 2, hyperlipidemia admitted on 03/08/2021 with postobstructive pneumonia.  She was initially brought to the ED on 9/17 due to confusion, nausea, and poor p.o. intake.  Her husband lives with her but is elderly and not able to care for her.  There was concern expressed by family about her pain medication usage.  PDMP reveals that she does have 2 different prescriptions for fentanyl patches within a 2-week period, however, the initial patch was written for 37.5 mcg and filled on 01/28/2021 and the second prescription was from 50 mcg and filled on 02/09/2021.  As the second filled prescription was for a higher dose, this may have just been an up titration of her needed dose versus being an aberration in medication usage.  She was recently admitted to [facility], however, and it was noted that she got too sleepy with fentanyl patch and it was discontinued.  Following evaluation in ED, it was determined that she needs SNF placement at time of discharge and she was admitted for treatment of postobstructive pneumonia and working towards placement.  Palliative consulted for goals of care.  Clinical Assessment and Goals of Care: Palliative care consult received.  Chart reviewed including personal review of pertinent labs and imaging.  I met today with Ms. Jamie Ayers.  Her pastor was at the bedside at time of my arrival but he left shortly after I came in to see her.  She expressed being very happy that he came to see her as her faith is very  important to her.  She also reports that her family, feeling as well as she can, and faith that the most important things to her.  She appears to be somewhat confused and is not really able to give me a reliable history about what has been going on in regards to recent cancer diagnosis, plan for her care, or her recent pain management regimen.  When I asked her about pain, she all of a sudden becomes very labile and start screaming out that she is hurting so bad and needs something for pain.  She tells me the pain is located predominantly on the left side of her abdomen and there are no exacerbating or remitting factors that she is aware of.  It does not radiate.  It appears to be very sharp in nature per her report this afternoon.  She is not able to tell me anything about her regimen at home which per PDMP includes fentanyl patch, oxycodone, and tramadol.  I also called and spoke with her husband.  He reports that she was recently diagnosed with cancer at [facility], but then they were also told  that this may not be a cancer diagnosis.  It is not clear to me exactly what is going on regarding this after discussing with him and he tells me he is not sure either.  He feels that she needs to be discharged to skilled facility and have the opportunity to follow-up with her providers at [facility] to further discuss options for her care there as there seems to be discordance among providers about the care plan moving forward.  I do see reports of a biopsy with specific cancer diagnosis, so I am not sure where the confusion comes from with family (?maybe the confusion is in regard to the staging of cancer as her husband seems to reports she had a PET scan that did not show any active areas?)  Following discussion, her husband feels that she would best served to transition to skilled facility for trial of rehab and is agreeable to potential for outpatient follow-up with palliative care after she sees her  oncologist at [facility] if it can be arranged.  SUMMARY OF RECOMMENDATIONS   - DNR/DNI - Continue current care.  Plan is for discharge to skilled facility and follow-up with patient's outpatient oncology providers through [facility].  In talking with her husband, family does not really have a good understanding of exactly what all is going on in regard to her cancer diagnosis.  He states they have been told different information from different providers at [facility] regarding what plan for treatment and likely prognosis would be.  He feels they need further clarification from her outpatient oncology team prior to making any other decisions regarding long-term goals of care. -Pain, cancer related: Family has expressed concern about her misusing medications.  It is unclear to me exactly what she has been using at home.  There is talk of her using "more than 1 patch" but in talking with her husband today it appears that the multiple patch usage was from utilizing multiple lidocaine patches at the same time rather than utilizing multiple fentanyl patches at the same time.  However, on examination, I did find a well-worn fentanyl patch on her back that appears to have been on for greater than 3 days (the word fentanyl was still partially visible but not the dose).  Her husband is not sure when the last time this patch was changed.  She is very labile reporting her pain is difficult to determine her needs as she gets sleepy after pain medications.  We will plan to continue with Percocet 5/325 and monitor closely.  In reviewing medical record, her discharge regimen from [facility] a couple of weeks ago was oxycodone 10 mg every 6 hours as needed for pain with her being able to get an additional 5 mg if this did not relieve her pain sufficiently.  She reports being in pain now and I have asked her RN to give her a dose of medication and let me know in an hour if she is still complaining of pain.  I also asked her  to let me know if she is too sleepy following dose of medication.  Will likely recommend discharge on this regimen and medications to be further titrated as needed once she is more active with rehab.  Code Status/Advance Care Planning: DNR  Prognosis:  Unable to determine  Discharge Planning: [place] for rehab with Palliative care service follow-up      Primary Diagnoses: Present on Admission:  CAP (community acquired pneumonia)  Anemia  Hyperlipemia  GERD (gastroesophageal reflux disease)  CONSTIPATION, CHRONIC  Benign essential HTN  Opioid abuse (HCC)  Lung mass  COPD (chronic obstructive pulmonary disease) (HCC)  Chronic diastolic CHF (congestive heart failure) ([place])   I have reviewed the medical record, interviewed the patient and family, and examined the patient. The following aspects are pertinent.  Past Medical History:  Diagnosis Date   Anemia    Anxiety disorder    Asthmatic bronchitis    Benign essential HTN 08/06/11   ECHO-EF>55%   CHF (congestive heart failure) (HCC)    HeFPEF   COVID-19    Diabetes mellitus without complication (HCC)    Disorder of vocal cord    GERD (gastroesophageal reflux disease)    Hernia    Hyperlipidemia 03/14/12   Lexiscan myoview-WNL; unchanged from previous study.   OSA on CPAP 03/28/07   sleep study-[facility]-AHI 9.42/HR and during REM sleep $RemoveB'@37'SlFuSGue$ .50/hr. The average 02 sat duringREM AND NREM was 97.0%   Thyroid disease    Upper respiratory infection    Social History   Socioeconomic History   Marital status: Married    Spouse name: Not on file   Number of children: Not on file   Years of education: Not on file   Highest education level: Not on file  Occupational History   Not on file  Tobacco Use   Smoking status: Never   Smokeless tobacco: Never  Vaping Use   Vaping Use: Never used  Substance and Sexual Activity   Alcohol use: No    Alcohol/week: 0.0 standard drinks    Drug use: No   Sexual activity: Yes    Birth control/protection: Post-menopausal, Surgical    Comment: HYST-1st intercourse 75 yo-Fewer than 5 partners,DES NEG  Other Topics Concern   Not on file  Social History Narrative   Not on file   Social Determinants of Health   Financial Resource Strain: Not on file  Food Insecurity: Not on file  Transportation Needs: Not on file  Physical Activity: Not on file  Stress: Not on file  Social Connections: Not on file   Family History  Problem Relation Age of Onset   Diabetes Father    Hypertension Father    Cancer Father        Prostate cancer   Arthritis Father    Gout Father    Hypertension Mother    Stroke Mother    Breast cancer Maternal Grandmother        Age 49's   Ovarian cancer Paternal Grandmother    Breast cancer Sister        Age 66   Kidney disease Sister    Scheduled Meds:  amoxicillin-clavulanate  1 tablet Oral Q12H   aspirin EC  81 mg Oral Daily   atorvastatin  40 mg Oral Daily   budesonide  0.5 mg Nebulization BID   carvedilol  10 mg Oral Daily   enoxaparin (LOVENOX) injection  40 mg Subcutaneous Q24H   feeding supplement (GLUCERNA SHAKE)  237 mL Oral TID BM   insulin aspart  0-9 Units Subcutaneous Q4H   montelukast  10 mg Oral QHS   pantoprazole  40 mg Oral Daily   polyethylene glycol  17 g Oral Daily   sacubitril-valsartan  1 tablet Oral BID   Continuous Infusions:  promethazine (PHENERGAN) injection (IM or IVPB) 6.25 mg (03/10/21 1038)   PRN Meds:.acetaminophen **OR** acetaminophen, albuterol, lip balm, ondansetron (ZOFRAN) IV, oxyCODONE-acetaminophen, promethazine (PHENERGAN) injection (IM or IVPB)  Medications Prior to Admission:  Prior to Admission medications   Medication Sig Start Date End Date Taking? Authorizing Provider  albuterol (PROVENTIL HFA;VENTOLIN HFA) 108 (90 BASE) MCG/ACT inhaler Inhale 2 puffs into the lungs every 4 (four) hours as needed for wheezing. 02/18/12 12/19/20  Corlis Leak, MD   ascorbic acid (VITAMIN C) 500 MG tablet Take by mouth.    [provider]  aspirin 81 MG EC tablet aspirin 81 mg tablet,delayed release  Take 1 tablet every day by oral route.    [provider]  atorvastatin (LIPITOR) 40 MG tablet Take 1 tablet (40 mg total) by mouth daily. 12/19/20   Troy Sine, MD  budesonide (PULMICORT) 0.5 MG/2ML nebulizer solution Inhale into the lungs. 04/17/19   [provider]  busPIRone (BUSPAR) 15 MG tablet Take 7.5 mg by mouth 2 (two) times daily. 01/08/21   [provider]  Calcium Carbonate-Vitamin D (CALCIUM + D PO) Take 1 tablet by mouth daily.     [provider]  calcium-vitamin D (OSCAL WITH D) 500-200 MG-UNIT TABS tablet Take by mouth. 03/13/10   [provider]  carvedilol (COREG CR) 40 MG 24 hr capsule TAKE 1 CAPSULE BY MOUTH EVERY DAY IN THE EVENING 09/30/20   Troy Sine, MD  cycloSPORINE (RESTASIS) 0.05 % ophthalmic emulsion Place 1 drop into both eyes daily. 05/17/16   [provider]  Empagliflozin (JARDIANCE PO) Take by mouth.    [provider]  fentaNYL (DURAGESIC) 50 MCG/HR Place 1 patch onto the skin every 3 (three) days. 02/09/21   [provider]  ferrous gluconate (FERGON) 324 MG tablet Take 324 mg by mouth daily. 12/26/20   [provider]  fluconazole (DIFLUCAN) 150 MG tablet Take 1 tablet (150 mg total) by mouth every 3 (three) days as needed for up to 6 doses. Patient taking differently: Take 150 mg by mouth as needed. 08/06/20   Joseph Pierini, MD  furosemide (LASIX) 20 MG tablet Take 1 tablet (20 mg total) by mouth daily. 09/03/20   Troy Sine, MD  glipiZIDE (GLUCOTROL XL) 5 MG 24 hr tablet glipizide ER 5 mg tablet, extended release 24 hr  TAKE 1 TABLET BY MOUTH ONCE DAILY    [provider]  metFORMIN (GLUCOPHAGE) 500 MG tablet Take by mouth 2 (two) times daily with a meal.    [provider]  montelukast (SINGULAIR) 10 MG  tablet Take 10 mg by mouth at bedtime.    [provider]  Multiple Vitamin (MULTIVITAMIN) tablet Take 1 tablet by mouth daily.      [provider]  Multiple Vitamins-Minerals (CENTRUM SILVER) tablet Take by mouth.    [provider]  NEXIUM 40 MG capsule TAKE ONE CAPSULE BY MOUTH TWICE A DAY BEFORE MEALS 05/08/20   Troy Sine, MD  nitrofurantoin (MACRODANTIN) 50 MG capsule Take by mouth. 01/19/19   [provider]  NON FORMULARY CPAP THERAPY    [provider]  NONFORMULARY OR COMPOUNDED ITEM Boric acid vaginal suppositories 600 mg one per vagina as needed for yeast 08/06/20   Joseph Pierini, MD  nystatin-triamcinolone (MYCOLOG II) cream Apply 1 application topically 2 (two) times daily. 05/09/19   Fontaine, Belinda Block, MD  omega-3 acid ethyl esters (LOVAZA) 1 g capsule Take 2 capsules (2 g total) by mouth 2 (two) times daily. 12/19/20   Troy Sine, MD  oxyCODONE-acetaminophen (PERCOCET/ROXICET) 5-325 MG tablet TAKE 1 TABLET BY MOUTH TWICE DAILY AS NEEDED FOR  PAIN 06/12/19   [provider]  phenazopyridine (PYRIDIUM) 200 MG tablet Take 1 tablet (200 mg total) by mouth 3 (three) times daily as needed for pain. Bladder pain 11/06/19   Joseph Pierini, MD  polyethylene glycol [facility] / Floria Raveling) packet Take 17 g by mouth daily. 06/23/16   [provider]  sacubitril-valsartan (ENTRESTO) 97-103 MG Take 1 tablet by mouth 2 (two) times daily. 09/30/20   Troy Sine, MD  spironolactone (ALDACTONE) 25 MG tablet Take 1/2 tablet ( 12.5 mg ) daily 09/30/20   Troy Sine, MD  zolpidem (AMBIEN) 10 MG tablet Take 1 tablet (10 mg total) by mouth at bedtime as needed. For sleep 08/29/17   Troy Sine, MD   Allergies  Allergen Reactions   Midazolam Other (See Comments)    REACTION: cardiac arrest   Midazolam Hcl     Cardiac arrest    VERSED   Codeine Hives and Itching   Morphine And Related Hives   Paba Derivatives Nausea Only     PT REPORTS SEVERE NAUSEA AFTER SURGERY, DENIES VOMITING   Sulfonamide Derivatives Hives and Itching   Sulfamethoxazole Hives   Review of Systems Reports severe abdominal pain.  Focuses on this and denies all other complaints.  Physical Exam General: Alert, awake, in no acute distress at beginning of encounter but when asked about pain she suddenly became agitated and reported she is hurting tremendously. HEENT: No bruits, no goiter, no JVD Heart: Regular rate and rhythm. No murmur appreciated. Lungs: Diminished air movement, clear Ext: No significant edema Skin: Warm and dry Neuro: Grossly intact, nonfocal.   Vital Signs: BP (!) 159/80 (BP Location: Right Arm)   Pulse 92   Temp 98.2 F (36.8 C) (Oral)   Resp 20   Ht 5' 5" (1.651 m)   Wt 89.9 kg   SpO2 98%   BMI 32.98 kg/m  Pain Scale: 0-10   Pain Score: 0-No pain   SpO2: SpO2: 98 % O2 Device:SpO2: 98 % O2 Flow Rate: .O2 Flow Rate (L/min): 0 L/min  IO: Intake/output summary:  Intake/Output Summary (Last 24 hours) at 03/12/2021 7741 Last data filed at 03/11/2021 1843 Gross per 24 hour  Intake 240 ml  Output 1100 ml  Net -860 ml    LBM: Last BM Date: 03/09/21 Baseline Weight: Weight: 88 kg Most recent weight: Weight: 89.9 kg     Palliative Assessment/Data:   Flowsheet Rows    Flowsheet Row Most Recent Value  Intake Tab   Referral Department Hospitalist  Unit at Time of Referral Med/Surg Unit  Palliative Care Primary Diagnosis Cancer  Date Notified 03/09/21  Palliative Care Type New Palliative care  Reason for referral Clarify Goals of Care  Date of Admission 03/08/21  Date first seen by Palliative Care 03/11/21  # of days Palliative referral response time 2 Day(s)  # of days IP prior to Palliative referral 1  Clinical Assessment   Palliative Performance Scale Score 60%  Psychosocial & Spiritual Assessment   Palliative Care Outcomes   Patient/Family meeting held? Yes  Who was at the meeting? Patient,  husband via phone      Time Total: 85 minutes Greater than 50%  of this time was spent counseling and coordinating care related to the above assessment and plan.  Signed by: Micheline Rough, MD   Please contact Palliative Medicine Team phone at [phone] for questions and concerns.  For individual provider: See Shea Evans

## 2021-03-12 NOTE — Discharge Summary (Addendum)
Physician Discharge Summary  Jamie Ayers:[id number] DOB: Sep 14, 1945 DOA: 03/08/2021  PCP: Jamie Blocker, MD  Admit date: 03/08/2021 Discharge date: 03/12/2021  Admitted From: Home Disposition:  SNF  Recommendations for Outpatient Follow-up:  Follow up with PCP in 1-2 weeks Please obtain BMP/CBC in one week Please follow up with oncology as recommended in 1 to 2 weeks.  Recommend outpatient follow up with palliative care    Discharge Condition:GUARDED.  CODE STATUS:DNR Diet recommendation: Heart Healthy   Brief/Interim Summary:  75 year old female with history of lung cancer followed at [facility], chronic systolic CHF, DM 2, hyperlipidemia who presents to the hospital with concern for postobstructive pneumonia.  She was brought to the ED on 9/17 due to mild confusion as well as nausea, poor p.o. intake.  Apparently she lives alone and her husband is unable to help her. As per ER provider  It appears that since July patient has received multiple prescriptions for tramadol and oxycodone, additionally has 2 active prescriptions from 2 separate providers for fentanyl patches.  According to her sons, patient has been found at home to be placing more than 1 fentanyl patch on her body at a time, and they voiced concerns that she is "self-medicating".  She was given the ED for a few days and initial plan was patient to be placed in an SNF however she ended up being admitted due to leukocytosis and concern for postobstructive pneumonia on the CT scan, EDP felt she is unsafe for SNF discharge from the ED without further treatment.   Discharge Diagnoses:  Active Problems:   CONSTIPATION, CHRONIC   Benign essential HTN   GERD (gastroesophageal reflux disease)   Hyperlipemia   OSA on CPAP   CAP (community acquired pneumonia)   Anemia   Opioid abuse (HCC)   Lung mass   COPD (chronic obstructive pulmonary disease) (HCC)   Chronic diastolic CHF (congestive heart failure) (HCC)   DM2  (diabetes mellitus, type 2) (HCC)  Postobstructive pneumonia CT of the abdomen pelvis done in ED showed left lung base consolidation and small pleural effusion there was a concern for pneumonia given elevated WBC. She was initially started on Zosyn, transition to oral Augmentin to complete the course She appears to have persistent leukocytosis, possibly from her non-small cell lung cancer     Non-small cell lung cancer s/p IR guided biopsy on 02/20/2021 which showed non-small cell lung cancer.  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     Hypokalemia and hypomagnesemia Replaced     Hyponatremia:  Mild.      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  In December 2005 her ejection fraction was 20%. With aggressive medical therapy LV function normalized to approximately 55%. Outpatient follow-up with cardiology as needed. Continue with Entresto, aspirin, Coreg, Lipitor.   COPD No wheezing heard on exam today.  Bronchodilators as needed, continue with Pulmicort     Obstructive sleep apnea on CPAP continue the same.   Non-insulin-dependent diabetes mellitus Continue with sliding scale insulin while inpatient Patient is on metformin Jardiance and glipizide at home, of which only jardiance is being continued We will go ahead and stop the metformin and glipizide as her hemoglobin A1c is around 5.7%.      Folic acid deficiency Replacements ordered  Hypophosphatemia  Replaced and replacements ordered.   OSA ON CPAP.     Discharge Instructions  Discharge Instructions     Diet - low sodium heart healthy   Complete by: As directed    Discharge instructions   Complete by: As directed    Please follow up with oncology as recommended in 1 to 2 weeks.    Face-to-face encounter (required for Medicare/Medicaid patients)   Complete by: As directed    I Jamie Ayers certify that this patient is under my care and that I, or a nurse practitioner or physician's assistant working with me, had a face-to-face encounter that meets the physician face-to-face encounter requirements with this patient on 03/09/2021. The encounter with the patient was in whole, or in part for the following medical condition(s) which is the primary reason for home health care (List medical condition): deconditioning, new diagnosis of nonsmall cell lung cancer at [facility]   The encounter with the patient was in whole, or in part, for the following medical condition, which is the primary reason for home health care: deconditioning, lung cancer   I certify that, based on my findings, the following services are medically necessary home health services:  Physical therapy Nursing     Reason for Medically Necessary Home Health Services: Skilled Nursing- Change/Decline in Patient Status   My clinical findings support the need for the above services: Pain interferes with ambulation/mobility   Further, I certify that my clinical findings support that this patient is homebound due to: Unsafe ambulation due to balance issues   Home Health   Complete by: As directed    To provide the following care/treatments:  PT [place]        Allergies as of 03/12/2021       Reactions   Midazolam Other (See Comments)   REACTION: cardiac arrest   Midazolam Hcl    Cardiac arrest    VERSED   Codeine Hives, Itching   Morphine And Related Hives   Paba Derivatives Nausea Only   PT REPORTS SEVERE NAUSEA AFTER SURGERY, DENIES VOMITING   Sulfonamide Derivatives Hives, Itching   Sulfamethoxazole Hives        Medication List     STOP taking these medications    CALCIUM + D PO   calcium-vitamin D 500-200 MG-UNIT Tabs tablet Commonly known as: OSCAL WITH D   Centrum Silver  tablet   fentaNYL 50 MCG/HR Commonly known as: DURAGESIC   fluconazole 150 MG tablet Commonly known as: DIFLUCAN   glipiZIDE 5 MG 24 hr tablet Commonly known as: GLUCOTROL XL   metFORMIN 500 MG tablet Commonly known as: GLUCOPHAGE   NONFORMULARY OR COMPOUNDED ITEM       TAKE these medications    albuterol 108 (90 Base) MCG/ACT inhaler Commonly known as: VENTOLIN HFA Inhale 2 puffs into the lungs every 4 (four) hours as needed for wheezing.   amoxicillin-clavulanate 875-125 MG tablet Commonly known as: AUGMENTIN Take 1 tablet by mouth every 12 (twelve) hours for 5 days.   ascorbic acid 500 MG tablet Commonly known as: VITAMIN C Take by mouth.   aspirin 81 MG EC tablet aspirin 81 mg tablet,delayed release  Take 1 tablet every day by oral route.   atorvastatin 40 MG tablet Commonly known as: LIPITOR Take 1 tablet (40 mg total) by mouth daily.   budesonide 0.5 MG/2ML nebulizer solution Commonly known as: PULMICORT Inhale into the lungs.   busPIRone 15 MG tablet Commonly known as: BUSPAR Take  0.5 tablets (7.5 mg total) by mouth 2 (two) times daily.   carvedilol 40 MG 24 hr capsule Commonly known as: COREG CR Take 1 capsule (40 mg total) by mouth daily. TAKE 1 CAPSULE BY MOUTH EVERY DAY IN THE EVENING What changed:  how much to take how to take this when to take this   cycloSPORINE 0.05 % ophthalmic emulsion Commonly known as: RESTASIS Place 1 drop into both eyes daily.   empagliflozin 10 MG Tabs tablet Commonly known as: Jardiance Take 1 tablet (10 mg total) by mouth daily. What changed:  medication strength how much to take when to take this   Entresto 97-103 MG Generic drug: sacubitril-valsartan Take 1 tablet by mouth 2 (two) times daily.   feeding supplement (GLUCERNA SHAKE) Liqd Take 237 mLs by mouth 3 (three) times daily between meals.   ferrous gluconate 324 MG tablet Commonly known as: FERGON Take 324 mg by mouth daily.   folic acid 1  MG tablet Commonly known as: FOLVITE Take 1 tablet (1 mg total) by mouth daily.   furosemide 20 MG tablet Commonly known as: LASIX Take 1 tablet (20 mg total) by mouth daily.   montelukast 10 MG tablet Commonly known as: SINGULAIR Take 10 mg by mouth at bedtime.   multivitamin tablet Take 1 tablet by mouth daily.   NexIUM 40 MG capsule Generic drug: esomeprazole TAKE ONE CAPSULE BY MOUTH TWICE A DAY BEFORE MEALS   nitrofurantoin 50 MG capsule Commonly known as: MACRODANTIN Take 1 capsule (50 mg total) by mouth daily. What changed:  how much to take when to take this   NON FORMULARY CPAP THERAPY   nystatin-triamcinolone cream Commonly known as: MYCOLOG II Apply 1 application topically 2 (two) times daily.   omega-3 acid ethyl esters 1 g capsule Commonly known as: LOVAZA Take 2 capsules (2 g total) by mouth 2 (two) times daily.   oxyCODONE-acetaminophen 5-325 MG tablet Commonly known as: PERCOCET/ROXICET Take 1 tablet by mouth 2 (two) times daily as needed. for pain   phenazopyridine 200 MG tablet Commonly known as: Pyridium Take 1 tablet (200 mg total) by mouth 3 (three) times daily as needed for pain. Bladder pain   polyethylene glycol 17 g packet Commonly known as: MIRALAX / GLYCOLAX Take 17 g by mouth daily.   potassium & sodium phosphates 280-160-250 MG Pack Commonly known as: PHOS-NAK Take 1 packet by mouth 4 (four) times daily -  with meals and at bedtime.   spironolactone 25 MG tablet Commonly known as: Aldactone Take 1/2 tablet ( 12.5 mg ) daily   zolpidem 10 MG tablet Commonly known as: AMBIEN Take 1 tablet (10 mg total) by mouth at bedtime as needed. For sleep        Contact information for follow-up providers     Jamie Blocker, MD .   Specialty: Internal Medicine Contact information: [place] [id number] [phone]         Troy Sine, MD .   Specialty: Cardiology Contact information: [address] [place]  [address] [phone]              Contact information for after-discharge care     Destination     [facility] [place] [facility] .   Service: Skilled Nursing Contact information: [address] [place] [phone]  Allergies  Allergen Reactions   Midazolam Other (See Comments)    REACTION: cardiac arrest   Midazolam Hcl     Cardiac arrest    VERSED   Codeine Hives and Itching   Morphine And Related Hives   Paba Derivatives Nausea Only    PT REPORTS SEVERE NAUSEA AFTER SURGERY, DENIES VOMITING   Sulfonamide Derivatives Hives and Itching   Sulfamethoxazole Hives    Consultations: Palliative care   Procedures/Studies: CT ABDOMEN PELVIS W CONTRAST  Result Date: 03/09/2021 CLINICAL DATA:  Abdominal abscess or infection suspected. Abdominal pain beginning last week. Vomiting. EXAM: CT ABDOMEN AND PELVIS WITH CONTRAST TECHNIQUE: Multidetector CT imaging of the abdomen and pelvis was performed using the standard protocol following bolus administration of intravenous contrast. CONTRAST:  10mL OMNIPAQUE IOHEXOL 350 MG/ML SOLN COMPARISON:  02/27/2021, 02/20/2021 FINDINGS: Lower chest: Small left pleural effusion. Consolidation in the left lung base. This is likely pneumonia, possibly mass. Similar appearance to previous study. Hepatobiliary: Focal lesion with centripetal enhancement demonstrated in the dome of the liver, likely a cavernous hemangioma. No other focal lesions identified. Gallbladder and bile ducts are unremarkable. Pancreas: Calcifications in the uncinate process likely representing evidence of chronic pancreatitis. No acute changes identified. Spleen: Normal in size without focal abnormality. Adrenals/Urinary Tract: Adrenal glands are unremarkable. Kidneys are normal, without renal calculi, focal lesion, or hydronephrosis. Bladder is unremarkable. Stomach/Bowel:  Stomach, small bowel, and colon are not abnormally distended. No wall thickening or inflammatory changes are appreciated. Appendix is normal. Vascular/Lymphatic: Aortic atherosclerosis. No enlarged abdominal or pelvic lymph nodes. Reproductive: Status post hysterectomy. No adnexal masses. Other: No free air or free fluid in the abdomen. Small periumbilical hernia containing fat. Musculoskeletal: No acute or significant osseous findings. IMPRESSION: 1. Consolidation in the left lung base with small left pleural effusion, likely pneumonia. Obstructing mass lesion would be also possible. This was present on previous studies. Correlate with prior CT chest. 2. 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 3. Chronic pancreatitis.  No acute inflammatory changes. 4. No evidence of bowel obstruction or inflammation. Electronically Signed   By: Lucienne Capers M.D.   On: 03/09/2021 01:10   DG Chest Portable 1 View  Result Date: 03/09/2021 CLINICAL DATA:  Left lower lobe abnormal breath sounds on exam, vomiting EXAM: PORTABLE CHEST 1 VIEW COMPARISON:  11/03/2020 FINDINGS: Left lower lobe masslike consolidation noted in the retrocardiac region obscuring the left hemidiaphragm compatible with underlying lung mass. This is better demonstrated on the abdomen CT. By CT, there is chest wall involvement and osseous involvement of the left tenth rib. Small left effusion noted laterally. Heart is enlarged. Right lung remains clear. Degenerative changes of the spine. IMPRESSION: Large left lower lobe mass with chest wall and rib involvement better demonstrated by CT. Small lateral left effusion Cardiomegaly Electronically Signed   By: Jerilynn Mages.  Shick M.D.   On: 03/09/2021 11:28      Subjective: No new complaints  Discharge Exam: Vitals:   03/12/21 0500 03/12/21 0723  BP: (!) 159/80   Pulse: 92   Resp: 20   Temp: 98.2 F (36.8 C)   SpO2: 98% 98%   Vitals:   03/11/21 1928 03/11/21 1958 03/12/21 0500 03/12/21 0723  BP:  (!) 167/83  (!) 159/80   Pulse:  87 92   Resp:  16 20   Temp:  98.4 F (36.9 C) 98.2 F (36.8 C)   TempSrc:  Oral Oral   SpO2: 96% 97% 98% 98%  Weight:  Height:        General: Pt is alert, awake, not in acute distress Cardiovascular: RRR, S1/S2 +, no rubs, no gallops Respiratory: CTA bilaterally, no wheezing, no rhonchi Abdominal: Soft, NT, ND, bowel sounds + Extremities: no edema, no cyanosis    The results of significant diagnostics from this hospitalization (including imaging, microbiology, ancillary and laboratory) are listed below for reference.     Microbiology: Recent Results (from the past 240 hour(s))  Urine Culture     Status: None   Collection Time: 03/09/21 10:19 AM   Specimen: Urine, Clean Catch  Result Value Ref Range Status   Specimen Description   Final    URINE, CLEAN CATCH Performed at [facility], [place] [address], [place], [place] [address]    Special Requests   Final    NONE Performed at [facility], [place] [address], [place], [place] [address]    Culture   Final    NO GROWTH Performed at [facility], [place] [address] [place], [place] [address]    Report Status 03/10/2021 FINAL  Final  Blood culture (routine x 2)     Status: None (Preliminary result)   Collection Time: 03/09/21  5:31 PM   Specimen: BLOOD  Result Value Ref Range Status   Specimen Description   Final    BLOOD LEFT ARM Performed at [facility] [address], [place], [place] [address]    Special Requests   Final    BOTTLES DRAWN AEROBIC AND ANAEROBIC Blood Culture adequate volume Performed at [place] [address], [place], [place] [address]    Culture   Final    NO GROWTH 3 DAYS Performed at [facility], [place] [address], [place], [place] [address]    Report Status PENDING  Incomplete  Resp Panel by RT-PCR (Flu A&B, Covid) Nasopharyngeal Swab     Status: None   Collection Time:  03/09/21  6:23 PM   Specimen: Nasopharyngeal Swab; Nasopharyngeal(NP) swabs in vial transport medium  Result Value Ref Range Status   SARS Coronavirus 2 by RT PCR NEGATIVE NEGATIVE Final    Comment: (NOTE) SARS-CoV-2 target nucleic acids are NOT DETECTED.  The SARS-CoV-2 RNA is generally detectable in upper respiratory specimens during the acute phase of infection. The lowest concentration of SARS-CoV-2 viral copies this assay can detect is 138 copies/mL. A negative result does not preclude SARS-Cov-2 infection and should not be used as the sole basis for treatment or other patient management decisions. A negative result may occur with  improper specimen collection/handling, submission of specimen other than nasopharyngeal swab, presence of viral mutation(s) within the areas targeted by this assay, and inadequate number of viral copies(<138 copies/mL). A negative result must be combined with clinical observations, patient history, and epidemiological information. The expected result is Negative.  Fact Sheet for Patients:  EntrepreneurPulse.com.au  Fact Sheet for Healthcare Providers:  IncredibleEmployment.be  This test is no t yet approved or cleared by the [place] FDA and  has been authorized for detection and/or diagnosis of SARS-CoV-2 by FDA under an Emergency Use Authorization (EUA). This EUA will remain  in effect (meaning this test can be used) for the duration of the COVID-19 declaration under Section 564(b)(1) of the Act, 21 U.S.C.section 360bbb-3(b)(1), unless the authorization is terminated  or revoked sooner.       Influenza A by PCR NEGATIVE NEGATIVE Final   Influenza B by PCR NEGATIVE NEGATIVE Final  Comment: (NOTE) The Xpert Xpress SARS-CoV-2/FLU/RSV plus assay is intended as an aid in the diagnosis of influenza from Nasopharyngeal swab specimens and should not be used as a sole basis for treatment. Nasal washings  and aspirates are unacceptable for Xpert Xpress SARS-CoV-2/FLU/RSV testing.  Fact Sheet for Patients: EntrepreneurPulse.com.au  Fact Sheet for Healthcare Providers: IncredibleEmployment.be  This test is not yet approved or cleared by the [place] FDA and has been authorized for detection and/or diagnosis of SARS-CoV-2 by FDA under an Emergency Use Authorization (EUA). This EUA will remain in effect (meaning this test can be used) for the duration of the COVID-19 declaration under Section 564(b)(1) of the Act, 21 U.S.C. section 360bbb-3(b)(1), unless the authorization is terminated or revoked.  Performed at [facility], [place] [address], [place], [place] [address]   Blood culture (routine x 2)     Status: None (Preliminary result)   Collection Time: 03/09/21  6:38 PM   Specimen: BLOOD  Result Value Ref Range Status   Specimen Description   Final    BLOOD LEFT ARM Performed at [facility] [address], [place], [place] [address]    Special Requests   Final    BOTTLES DRAWN AEROBIC AND ANAEROBIC Blood Culture results may not be optimal due to an inadequate volume of blood received in culture bottles Performed at [place] [address], [place], [place] [address]    Culture   Final    NO GROWTH 3 DAYS Performed at [facility], [place] [address] [place], [place] [address]    Report Status PENDING  Incomplete  MRSA Next Gen by PCR, Nasal     Status: None   Collection Time: 03/09/21 11:02 PM   Specimen: Nasal Mucosa; Nasal Swab  Result Value Ref Range Status   MRSA by PCR Next Gen NOT DETECTED NOT DETECTED Final    Comment: (NOTE) The GeneXpert MRSA Assay (FDA approved for NASAL specimens only), is one component of a comprehensive MRSA colonization surveillance program. It is not intended to diagnose MRSA infection nor to guide or monitor treatment for MRSA  infections. Test performance is not FDA approved in patients less than 37 years old. Performed at [facility], [place] [address] [place], [place] [address]   Resp Panel by RT-PCR (Flu A&B, Covid) Nasopharyngeal Swab     Status: None   Collection Time: 03/12/21  8:53 AM   Specimen: Nasopharyngeal Swab; Nasopharyngeal(NP) swabs in vial transport medium  Result Value Ref Range Status   SARS Coronavirus 2 by RT PCR NEGATIVE NEGATIVE Final    Comment: (NOTE) SARS-CoV-2 target nucleic acids are NOT DETECTED.  The SARS-CoV-2 RNA is generally detectable in upper respiratory specimens during the acute phase of infection. The lowest concentration of SARS-CoV-2 viral copies this assay can detect is 138 copies/mL. A negative result does not preclude SARS-Cov-2 infection and should not be used as the sole basis for treatment or other patient management decisions. A negative result may occur with  improper specimen collection/handling, submission of specimen other than nasopharyngeal swab, presence of viral mutation(s) within the areas targeted by this assay, and inadequate number of viral copies(<138 copies/mL). A negative result must be combined with clinical observations, patient history, and epidemiological information. The expected result is Negative.  Fact Sheet for Patients:  EntrepreneurPulse.com.au  Fact Sheet for Healthcare Providers:  IncredibleEmployment.be  This test is no t yet approved or cleared by the [place] and  has been authorized for detection and/or diagnosis of SARS-CoV-2 by FDA under an Emergency Use Authorization (EUA). This EUA will remain  in effect (meaning this test can be used) for the duration of the COVID-19 declaration under Section 564(b)(1) of the Act, 21 U.S.C.section 360bbb-3(b)(1), unless the authorization is terminated  or revoked sooner.       Influenza A by PCR NEGATIVE NEGATIVE Final    Influenza B by PCR NEGATIVE NEGATIVE Final    Comment: (NOTE) The Xpert Xpress SARS-CoV-2/FLU/RSV plus assay is intended as an aid in the diagnosis of influenza from Nasopharyngeal swab specimens and should not be used as a sole basis for treatment. Nasal washings and aspirates are unacceptable for Xpert Xpress SARS-CoV-2/FLU/RSV testing.  Fact Sheet for Patients: EntrepreneurPulse.com.au  Fact Sheet for Healthcare Providers: IncredibleEmployment.be  This test is not yet approved or cleared by the [place] FDA and has been authorized for detection and/or diagnosis of SARS-CoV-2 by FDA under an Emergency Use Authorization (EUA). This EUA will remain in effect (meaning this test can be used) for the duration of the COVID-19 declaration under Section 564(b)(1) of the Act, 21 U.S.C. section 360bbb-3(b)(1), unless the authorization is terminated or revoked.  Performed at [facility], [place] [address], [place], [place] [address]      Labs: BNP (last 3 results) No results for input(s): BNP in the last 8760 hours. Basic Metabolic Panel: Recent Labs  Lab 03/08/21 2200 03/09/21 2015 03/10/21 0544 03/11/21 0511 03/12/21 0852  NA 132* 135 133* 133* 135  K 4.3 3.4* 3.4* 3.3* 3.7  CL 100 102 103 102 103  CO2 22 24 23 24 25   GLUCOSE 77 105* 99 100* 99  BUN 10 10 9 9 8   CREATININE 0.82 0.70 0.57 0.73 0.69  CALCIUM 11.2* 10.9* 10.6* 10.4* 10.7*  MG  --  1.7 1.8  --   --   PHOS  --  2.6 2.4*  --  1.9*   Liver Function Tests: Recent Labs  Lab 03/08/21 2200 03/09/21 2015 03/10/21 0544 03/11/21 0511  AST 24 10* 10* 12*  ALT 16 12 10 11   ALKPHOS 82 73 67 84  BILITOT 1.5* 0.6 0.6 0.7  PROT 7.6 6.9 6.3* 6.7  ALBUMIN 2.7* 2.5* 2.2* 2.3*   Recent Labs  Lab 03/08/21 2200 03/09/21 2015  LIPASE 29 32   No results for input(s): AMMONIA in the last 168 hours. CBC: Recent Labs  Lab 03/08/21 2200 03/09/21 1836  03/10/21 0544 03/11/21 0511  WBC 21.2* 17.9* 16.5* 17.7*  NEUTROABS 18.1* 14.9* 13.5*  --   HGB 9.0* 8.3* 8.3* 8.9*  HCT 28.9* 26.7* 25.6* 28.1*  MCV 75.3* 74.0* 72.3* 73.4*  PLT 399 429* 391 396   Cardiac Enzymes: No results for input(s): CKTOTAL, CKMB, CKMBINDEX, TROPONINI in the last 168 hours. BNP: Invalid input(s): POCBNP CBG: Recent Labs  Lab 03/11/21 2000 03/11/21 2354 03/12/21 0437 03/12/21 0746 03/12/21 1152  GLUCAP 93 90 93 84 126*   D-Dimer No results for input(s): DDIMER in the last 72 hours. Hgb A1c Recent Labs    03/09/21 2249  HGBA1C 5.7*   Lipid Profile No results for input(s): CHOL, HDL, LDLCALC, TRIG, CHOLHDL, LDLDIRECT in the last 72 hours. Thyroid function studies Recent Labs    03/10/21 0544  TSH 2.306   Anemia work up Recent Labs    03/09/21 2015  VITAMINB12 3,909*  FOLATE 5.3*  FERRITIN 443*  TIBC 153*  IRON 29  RETICCTPCT 0.9  Urinalysis    Component Value Date/Time   COLORURINE YELLOW 03/09/2021 1019   APPEARANCEUR CLEAR 03/09/2021 1019   APPEARANCEUR Clear 12/12/2019 1455   LABSPEC 1.010 03/09/2021 1019   PHURINE 6.0 03/09/2021 1019   GLUCOSEU >=500 (A) 03/09/2021 1019   HGBUR NEGATIVE 03/09/2021 1019   BILIRUBINUR SMALL (A) 03/09/2021 1019   BILIRUBINUR Negative 12/12/2019 1455   KETONESUR 5 (A) 03/09/2021 1019   PROTEINUR 100 (A) 03/09/2021 1019   UROBILINOGEN 0.2 08/20/2014 1521   NITRITE NEGATIVE 03/09/2021 1019   LEUKOCYTESUR NEGATIVE 03/09/2021 1019   [facility] Invalid input(s): PROCALCITONIN,  WBC,  LACTICIDVEN Microbiology Recent Results (from the past 240 hour(s))  Urine Culture     Status: None   Collection Time: 03/09/21 10:19 AM   Specimen: Urine, Clean Catch  Result Value Ref Range Status   Specimen Description   Final    URINE, CLEAN CATCH Performed at [facility], [place] [address] [place], [place] [address]    Special Requests   Final    NONE Performed at [facility], [place] [address] [place], [place] [address]    Culture   Final    NO GROWTH Performed at [facility], [place] [address] [place], [place] [address]    Report Status 03/10/2021 FINAL  Final  Blood culture (routine x 2)     Status: None (Preliminary result)   Collection Time: 03/09/21  5:31 PM   Specimen: BLOOD  Result Value Ref Range Status   Specimen Description   Final    BLOOD LEFT ARM Performed at [place] [address] [place], [place] [address]    Special Requests   Final    BOTTLES DRAWN AEROBIC AND ANAEROBIC Blood Culture adequate volume Performed at [place] [address] [place], [place] [address]    Culture   Final    NO GROWTH 3 DAYS Performed at [facility], [place] [address] [place], [place] [address]    Report Status PENDING  Incomplete  Resp Panel by RT-PCR (Flu A&B, Covid) Nasopharyngeal Swab     Status: None   Collection Time: 03/09/21  6:23 PM   Specimen: Nasopharyngeal Swab; Nasopharyngeal(NP) swabs in vial transport medium  Result Value Ref Range Status   SARS Coronavirus 2 by RT PCR NEGATIVE NEGATIVE Final    Comment: (NOTE) SARS-CoV-2 target nucleic acids are NOT DETECTED.  The SARS-CoV-2 RNA is generally detectable in upper respiratory specimens during the acute phase of infection. The lowest concentration of SARS-CoV-2 viral copies this assay can detect is 138 copies/mL. A negative result does not preclude SARS-Cov-2 infection and should not be used as the sole basis for treatment or other patient management decisions. A negative result may occur with  improper specimen collection/handling, submission of specimen other than nasopharyngeal swab, presence of viral mutation(s) within the areas targeted by this assay, and inadequate number of viral copies(<138 copies/mL). A negative result must be combined with clinical observations, patient history, and  epidemiological information. The expected result is Negative.  Fact Sheet for Patients:  EntrepreneurPulse.com.au  Fact Sheet for Healthcare Providers:  IncredibleEmployment.be  This test is no t yet approved or cleared by the [place] FDA and  has been authorized for detection and/or diagnosis of SARS-CoV-2 by FDA under an Emergency Use Authorization (EUA). This EUA will remain  in effect (meaning this test can be used) for the duration of the COVID-19 declaration under Section 564(b)(1)  of the Act, 21 U.S.C.section 360bbb-3(b)(1), unless the authorization is terminated  or revoked sooner.       Influenza A by PCR NEGATIVE NEGATIVE Final   Influenza B by PCR NEGATIVE NEGATIVE Final    Comment: (NOTE) The Xpert Xpress SARS-CoV-2/FLU/RSV plus assay is intended as an aid in the diagnosis of influenza from Nasopharyngeal swab specimens and should not be used as a sole basis for treatment. Nasal washings and aspirates are unacceptable for Xpert Xpress SARS-CoV-2/FLU/RSV testing.  Fact Sheet for Patients: EntrepreneurPulse.com.au  Fact Sheet for Healthcare Providers: IncredibleEmployment.be  This test is not yet approved or cleared by the [place] FDA and has been authorized for detection and/or diagnosis of SARS-CoV-2 by FDA under an Emergency Use Authorization (EUA). This EUA will remain in effect (meaning this test can be used) for the duration of the COVID-19 declaration under Section 564(b)(1) of the Act, 21 U.S.C. section 360bbb-3(b)(1), unless the authorization is terminated or revoked.  Performed at [facility], [place] [address] [place], [place] [address]   Blood culture (routine x 2)     Status: None (Preliminary result)   Collection Time: 03/09/21  6:38 PM   Specimen: BLOOD  Result Value Ref Range Status   Specimen Description   Final    BLOOD LEFT ARM Performed  at [place] [address] [place], [place] [address]    Special Requests   Final    BOTTLES DRAWN AEROBIC AND ANAEROBIC Blood Culture results may not be optimal due to an inadequate volume of blood received in culture bottles Performed at [place] [address], [place], [place] [address]    Culture   Final    NO GROWTH 3 DAYS Performed at [facility], [place] [address], [place], [place] [address]    Report Status PENDING  Incomplete  MRSA Next Gen by PCR, Nasal     Status: None   Collection Time: 03/09/21 11:02 PM   Specimen: Nasal Mucosa; Nasal Swab  Result Value Ref Range Status   MRSA by PCR Next Gen NOT DETECTED NOT DETECTED Final    Comment: (NOTE) The GeneXpert MRSA Assay (FDA approved for NASAL specimens only), is one component of a comprehensive MRSA colonization surveillance program. It is not intended to diagnose MRSA infection nor to guide or monitor treatment for MRSA infections. Test performance is not FDA approved in patients less than 37 years old. Performed at [facility], [place] [address] [place], [place] [address]   Resp Panel by RT-PCR (Flu A&B, Covid) Nasopharyngeal Swab     Status: None   Collection Time: 03/12/21  8:53 AM   Specimen: Nasopharyngeal Swab; Nasopharyngeal(NP) swabs in vial transport medium  Result Value Ref Range Status   SARS Coronavirus 2 by RT PCR NEGATIVE NEGATIVE Final    Comment: (NOTE) SARS-CoV-2 target nucleic acids are NOT DETECTED.  The SARS-CoV-2 RNA is generally detectable in upper respiratory specimens during the acute phase of infection. The lowest concentration of SARS-CoV-2 viral copies this assay can detect is 138 copies/mL. A negative result does not preclude SARS-Cov-2 infection and should not be used as the sole basis for treatment or other patient management decisions. A negative result may occur with  improper specimen collection/handling,  submission of specimen other than nasopharyngeal swab, presence of viral mutation(s) within the areas targeted by this assay, and inadequate number of viral copies(<138 copies/mL). A negative result must be combined with clinical observations, patient  history, and epidemiological information. The expected result is Negative.  Fact Sheet for Patients:  EntrepreneurPulse.com.au  Fact Sheet for Healthcare Providers:  IncredibleEmployment.be  This test is no t yet approved or cleared by the [place] FDA and  has been authorized for detection and/or diagnosis of SARS-CoV-2 by FDA under an Emergency Use Authorization (EUA). This EUA will remain  in effect (meaning this test can be used) for the duration of the COVID-19 declaration under Section 564(b)(1) of the Act, 21 U.S.C.section 360bbb-3(b)(1), unless the authorization is terminated  or revoked sooner.       Influenza A by PCR NEGATIVE NEGATIVE Final   Influenza B by PCR NEGATIVE NEGATIVE Final    Comment: (NOTE) The Xpert Xpress SARS-CoV-2/FLU/RSV plus assay is intended as an aid in the diagnosis of influenza from Nasopharyngeal swab specimens and should not be used as a sole basis for treatment. Nasal washings and aspirates are unacceptable for Xpert Xpress SARS-CoV-2/FLU/RSV testing.  Fact Sheet for Patients: EntrepreneurPulse.com.au  Fact Sheet for Healthcare Providers: IncredibleEmployment.be  This test is not yet approved or cleared by the [place] FDA and has been authorized for detection and/or diagnosis of SARS-CoV-2 by FDA under an Emergency Use Authorization (EUA). This EUA will remain in effect (meaning this test can be used) for the duration of the COVID-19 declaration under Section 564(b)(1) of the Act, 21 U.S.C. section 360bbb-3(b)(1), unless the authorization is terminated or revoked.  Performed at [facility], [place] [address], [place], [place] [address]      Time coordinating discharge: 36 minutes  SIGNED:   Hosie Poisson, MD  [facility] 03/12/2021, 1:26 PM

## 2021-03-12 NOTE — ED Notes (Signed)
PTAR notified for pt transfer to [facility].

## 2021-03-12 NOTE — Care Management Important Message (Signed)
Important Message  Patient Details IM Letter given to the Patient. Name: Jamie Ayers MRN: [id number] Date of Birth: 08/24/1945   Medicare Important Message Given:  Yes     Kerin Salen 03/12/2021, 9:39 AM

## 2021-03-12 NOTE — Progress Notes (Signed)
Daily Progress Note   Patient Name: Jamie Ayers       Date: 03/12/2021 DOB: [birth year]  Age: 75 y.o. MRN#: [id number] Attending Physician: Hosie Poisson, MD Primary Care Physician: [name], MD Admit Date: 03/08/2021  Reason for Consultation/Follow-up: Establishing goals of care  Subjective: I saw and examined Jamie Ayers this morning.  She was sitting in bedside chair  in no distress. She denied complaints when I initially asked, but when I specifically asked if her pain was controlled she became very animated and stated that she is not getting pain medications when she wants it and she is being, "treated like a child."  We discussed plan to transition to rehab to see how much functional status she can regain.  Discussed her eventual goal is to return home.  Length of Stay: 3  Current Medications: Scheduled Meds:   amoxicillin-clavulanate  1 tablet Oral Q12H   aspirin EC  81 mg Oral Daily   atorvastatin  40 mg Oral Daily   budesonide  0.5 mg Nebulization BID   carvedilol  10 mg Oral Daily   enoxaparin (LOVENOX) injection  40 mg Subcutaneous Q24H   feeding supplement (GLUCERNA SHAKE)  237 mL Oral TID BM   folic acid  1 mg Oral Daily   insulin aspart  0-9 Units Subcutaneous Q4H   montelukast  10 mg Oral QHS   pantoprazole  40 mg Oral Daily   polyethylene glycol  17 g Oral Daily   potassium & sodium phosphates  1 packet Oral TID WC & HS   sacubitril-valsartan  1 tablet Oral BID    Continuous Infusions:  promethazine (PHENERGAN) injection (IM or IVPB) 6.25 mg (03/10/21 1038)    PRN Meds: acetaminophen **OR** acetaminophen, albuterol, lip balm, ondansetron (ZOFRAN) IV, oxyCODONE-acetaminophen, promethazine (PHENERGAN) injection (IM or IVPB)  Physical Exam         General:  Alert, awake, in no acute distress at beginning of encounter but when asked about pain she suddenly became agitated and reported she is hurting tremendously. HEENT: No bruits, no goiter, no JVD Heart: Regular rate and rhythm. No murmur appreciated. Lungs: Diminished air movement, clear Ext: No significant edema Skin: Warm and dry Neuro: Grossly intact, nonfocal.   Vital Signs: BP (!) 159/80 (BP Location: Right Arm)   Pulse 92  Temp 98.2 F (36.8 C) (Oral)   Resp 20   Ht 5\' 5"  (1.651 m)   Wt 89.9 kg   SpO2 98%   BMI 32.98 kg/m  SpO2: SpO2: 98 % O2 Device: O2 Device: Room Air O2 Flow Rate: O2 Flow Rate (L/min): 0 L/min  Intake/output summary:  Intake/Output Summary (Last 24 hours) at 03/12/2021 1251 Last data filed at 03/12/2021 1224 Gross per 24 hour  Intake 592 ml  Output 500 ml  Net 92 ml   LBM: Last BM Date: 03/09/21 Baseline Weight: Weight: 88 kg Most recent weight: Weight: 89.9 kg       Palliative Assessment/Data:    Flowsheet Rows    Flowsheet Row Most Recent Value  Intake Tab   Referral Department Hospitalist  Unit at Time of Referral Med/Surg Unit  Palliative Care Primary Diagnosis Cancer  Date Notified 03/09/21  Palliative Care Type New Palliative care  Reason for referral Clarify Goals of Care  Date of Admission 03/08/21  Date first seen by Palliative Care 03/11/21  # of days Palliative referral response time 2 Day(s)  # of days IP prior to Palliative referral 1  Clinical Assessment   Palliative Performance Scale Score 60%  Psychosocial & Spiritual Assessment   Palliative Care Outcomes   Patient/Family meeting held? Yes  Who was at the meeting? Patient, husband via phone       Patient Active Problem List   Diagnosis Date Noted   CAP (community acquired pneumonia) 03/09/2021   Anemia 03/09/2021   Opioid abuse ([place]) 03/09/2021   Lung mass 03/09/2021   COPD (chronic obstructive pulmonary disease) ([place]) 03/09/2021   Chronic diastolic CHF  (congestive heart failure) ([place]) 03/09/2021   DM2 (diabetes mellitus, type 2) ([place]) 03/09/2021   Exertional shortness of breath 01/03/2015   Chest pain 01/03/2015   Morbid obesity ([place]) 10/08/2013   Microcytic anemia 10/08/2013   OSA on CPAP 10/08/2013   NICM (nonischemic cardiomyopathy) ([place]) 03/11/2013   Hyperlipemia 07/10/2012   Allergic rhinitis 07/10/2012   Sleep apnea 07/10/2012   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 female  with past medical history of recently diagnosed lung cancer followed at [facility], chronic systolic CHF, DM 2, hyperlipidemia admitted on 03/08/2021 with postobstructive pneumonia.  She was initially brought to the ED on 9/17 due to confusion, nausea, and poor p.o. intake.  Her husband lives with her but is elderly and not able to care for her.  There was concern expressed by family about her pain medication usage.  PDMP reveals that she does have 2 different prescriptions for fentanyl patches within a 2-week period, however, the initial patch was written for 37.5 mcg and filled on 01/28/2021 and the second prescription was from 50 mcg and filled on 02/09/2021.  As the second filled prescription was for a higher dose, this may have just been an up titration of her needed dose versus being an aberration in medication usage.  She was recently admitted  to [facility], however, and it was noted that she got too sleepy with fentanyl patch and it was discontinued.   Following evaluation in ED, it was determined that she needs SNF placement at time of discharge and she was admitted for treatment of postobstructive pneumonia and working towards placement.  Palliative consulted for goals of care.   Recommendations/Plan: DNR/DNI Plan for transition to skilled facility and f/u with outpatient oncology providers. Pain, cancer related:  Would continue same regimen for now, but as her needs and actual usage become clear, there is a good chance she will need to have regimen adjusted.  Goals of Care and Additional Recommendations: Limitations on Scope of Treatment: Full Scope Treatment  Code Status:    Code Status Orders  (From admission, onward)           Start     Ordered   03/09/21 2233  Do not attempt resuscitation (DNR)  Continuous       Question Answer Comment  In the event of cardiac or respiratory ARREST Do not call a "code blue"   In the event of cardiac or respiratory ARREST Do not perform Intubation, CPR, defibrillation or ACLS   In the event of cardiac or respiratory ARREST Use medication by any route, position, wound care, and other measures to relive pain and suffering. May use oxygen, suction and manual treatment of airway obstruction as needed for comfort.      03/09/21 2232           Code Status History     Date Active Date Inactive Code Status Order ID Comments User Context   03/09/2021 2015 03/09/2021 2232 DNR [id number]  Toy Baker, MD ED       Prognosis:  Unable to determine  Discharge Planning: [place] for rehab with Palliative care service follow-up  Care plan was discussed with patient  Thank you for allowing the Palliative Medicine Team to assist in the care of this patient.   Total Time 30 Prolonged Time Billed No      Greater than 50%  of this time was spent counseling and coordinating care related to the above assessment and plan.  Micheline Rough, MD  Please contact Palliative  Medicine Team phone at [phone] for questions and concerns.

## 2021-03-12 NOTE — ED Provider Notes (Signed)
[facility] DEPT Provider Note   CSN: [id number] Arrival date & time: 03/12/21  1821     History Chief Complaint  Patient presents with   Angioedema    Jamie Ayers is a 75 y.o. female.  Jamie Ayers is discharged from this hospital approximately 6 hours ago.  When she presented to her skilled nursing facility, they were concerned about angioedema of the tongue.  This patient has lung cancer, CHF, diabetes, hypertension, hyperlipidemia.   She is not endorsing any symptoms at the moment.  The history is provided by the EMS personnel and the nursing home.  Allergic Reaction Presenting symptoms: no difficulty swallowing and no rash   Presenting symptoms comment:  Tongue swelling Severity:  Moderate Duration: unknown. Prior allergic episodes:  No prior episodes Context comment:  No known cause Relieved by:  Nothing Worsened by:  Nothing Ineffective treatments:  None tried     Past Medical History:  Diagnosis Date   Anemia    Anxiety disorder    Asthmatic bronchitis    Benign essential HTN 08/06/11   ECHO-EF>55%   CHF (congestive heart failure) (HCC)    HeFPEF   COVID-19    Diabetes mellitus without complication (HCC)    Disorder of vocal cord    GERD (gastroesophageal reflux disease)    Hernia    Hyperlipidemia 03/14/12   Lexiscan myoview-WNL; unchanged from previous study.   OSA on CPAP 03/28/07   sleep study-[facility]-AHI 9.42/HR and during REM sleep @37 .50/hr. The average 02 sat duringREM AND NREM was 97.0%   Thyroid disease    Upper respiratory infection     Patient Active Problem List   Diagnosis Date Noted   CAP (community acquired pneumonia) 03/09/2021   Anemia 03/09/2021   Opioid abuse ([place]) 03/09/2021   Lung mass 03/09/2021   COPD (chronic obstructive pulmonary disease) ([place]) 03/09/2021   Chronic diastolic CHF (congestive heart failure) ([place]) 03/09/2021   DM2 (diabetes mellitus, type 2)  ([place]) 03/09/2021   Exertional shortness of breath 01/03/2015   Chest pain 01/03/2015   Morbid obesity ([place]) 10/08/2013   Microcytic anemia 10/08/2013   OSA on CPAP 10/08/2013   NICM (nonischemic cardiomyopathy) ([place]) 03/11/2013   Hyperlipemia 07/10/2012   Allergic rhinitis 07/10/2012   Sleep apnea 07/10/2012   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   Upper respiratory infection    Asthmatic bronchitis    CHF (congestive heart failure) (HCC)    Benign essential HTN    GERD (gastroesophageal reflux disease)    Intraductal papilloma of breast 08/31/2011   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/2011   Fibroid    Endocervical polyp    CONSTIPATION, CHRONIC 08/26/2010   INTESTINAL GAS 08/26/2010   FULL INCONTINENCE OF FECES 08/26/2010   COLONIC POLYPS, ADENOMATOUS, HX OF 08/24/2010    Past Surgical History:  Procedure Laterality Date   ABDOMINAL HYSTERECTOMY  2013   TAH BSO   BREAST EXCISIONAL BIOPSY Left 2017   papilloma   BREAST RECONSTRUCTION Bilateral    Patient had fat pads removed from both sides    BREAST SURGERY     Papilloma   DILATION AND CURETTAGE OF UTERUS     ENDOMETRIAL BIOPSY     HERNIA REPAIR     HYSTEROSCOPY     KNEE SURGERY     arthroscopic   NISSEN FUNDOPLICATION     OOPHORECTOMY     BSO   Parathyroid surg     Vocal Cord Surg  OB History     Gravida  4   Para  4   Term  4   Preterm      AB      Living  4      SAB      IAB      Ectopic      Multiple      Live Births              Family History  Problem Relation Age of Onset   Diabetes Father    Hypertension Father    Cancer Father        Prostate cancer   Arthritis Father    Gout Father    Hypertension Mother    Stroke Mother    Breast cancer Maternal Grandmother        Age 59's   Ovarian cancer Paternal Grandmother    Breast cancer Sister        Age 69   Kidney disease Sister     Social History   Tobacco Use   Smoking status: Never   Smokeless  tobacco: Never  Vaping Use   Vaping Use: Never used  Substance Use Topics   Alcohol use: No    Alcohol/week: 0.0 standard drinks   Drug use: No    Home Medications Prior to Admission medications   Medication Sig Start Date End Date Taking? Authorizing Provider  albuterol (PROVENTIL HFA;VENTOLIN HFA) 108 (90 BASE) MCG/ACT inhaler Inhale 2 puffs into the lungs every 4 (four) hours as needed for wheezing. 02/18/12 12/19/20  Corlis Leak, MD  amoxicillin-clavulanate (AUGMENTIN) 875-125 MG tablet Take 1 tablet by mouth every 12 (twelve) hours for 5 days. 03/12/21 03/17/21  Hosie Poisson, MD  ascorbic acid (VITAMIN C) 500 MG tablet Take by mouth.    [provider]  aspirin 81 MG EC tablet aspirin 81 mg tablet,delayed release  Take 1 tablet every day by oral route.    [provider]  atorvastatin (LIPITOR) 40 MG tablet Take 1 tablet (40 mg total) by mouth daily. 12/19/20   Troy Sine, MD  budesonide (PULMICORT) 0.5 MG/2ML nebulizer solution Inhale into the lungs. 04/17/19   [provider]  busPIRone (BUSPAR) 15 MG tablet Take 0.5 tablets (7.5 mg total) by mouth 2 (two) times daily. 03/12/21   Hosie Poisson, MD  carvedilol (COREG CR) 40 MG 24 hr capsule Take 1 capsule (40 mg total) by mouth daily. TAKE 1 CAPSULE BY MOUTH EVERY DAY IN THE EVENING 03/12/21   Hosie Poisson, MD  cycloSPORINE (RESTASIS) 0.05 % ophthalmic emulsion Place 1 drop into both eyes daily. 05/17/16   [provider]  empagliflozin (JARDIANCE) 10 MG TABS tablet Take 1 tablet (10 mg total) by mouth daily. 03/12/21   Hosie Poisson, MD  feeding supplement, GLUCERNA SHAKE, (GLUCERNA SHAKE) LIQD Take 237 mLs by mouth 3 (three) times daily between meals. 03/12/21   Hosie Poisson, MD  ferrous gluconate (FERGON) 324 MG tablet Take 324 mg by mouth daily. 12/26/20   [provider]  folic acid (FOLVITE) 1 MG tablet Take 1 tablet (1 mg total) by mouth daily. 03/12/21   Hosie Poisson, MD  furosemide  (LASIX) 20 MG tablet Take 1 tablet (20 mg total) by mouth daily. 09/03/20   Troy Sine, MD  montelukast (SINGULAIR) 10 MG tablet Take 10 mg by mouth at bedtime.    [provider]  Multiple Vitamin (MULTIVITAMIN) tablet Take 1 tablet by mouth daily.  [provider]  NEXIUM 40 MG capsule TAKE ONE CAPSULE BY MOUTH TWICE A DAY BEFORE MEALS 05/08/20   Troy Sine, MD  nitrofurantoin (MACRODANTIN) 50 MG capsule Take 1 capsule (50 mg total) by mouth daily. 03/12/21   Hosie Poisson, MD  NON FORMULARY CPAP THERAPY    [provider]  nystatin-triamcinolone (MYCOLOG II) cream Apply 1 application topically 2 (two) times daily. 05/09/19   Fontaine, Belinda Block, MD  omega-3 acid ethyl esters (LOVAZA) 1 g capsule Take 2 capsules (2 g total) by mouth 2 (two) times daily. 12/19/20   Troy Sine, MD  oxyCODONE-acetaminophen (PERCOCET/ROXICET) 5-325 MG tablet Take 1 tablet by mouth 2 (two) times daily as needed. for pain 03/12/21   Hosie Poisson, MD  phenazopyridine (PYRIDIUM) 200 MG tablet Take 1 tablet (200 mg total) by mouth 3 (three) times daily as needed for pain. Bladder pain 11/06/19   Joseph Pierini, MD  polyethylene glycol [facility] / Floria Raveling) packet Take 17 g by mouth daily. 06/23/16   [provider]  potassium & sodium phosphates (PHOS-NAK) 280-160-250 MG PACK Take 1 packet by mouth 4 (four) times daily -  with meals and at bedtime. 03/12/21   Hosie Poisson, MD  sacubitril-valsartan (ENTRESTO) 97-103 MG Take 1 tablet by mouth 2 (two) times daily. 09/30/20   Troy Sine, MD  spironolactone (ALDACTONE) 25 MG tablet Take 1/2 tablet ( 12.5 mg ) daily 09/30/20   Troy Sine, MD  zolpidem (AMBIEN) 10 MG tablet Take 1 tablet (10 mg total) by mouth at bedtime as needed. For sleep 08/29/17   Troy Sine, MD    Allergies    Midazolam, Midazolam hcl, Codeine, Morphine and related, Paba derivatives, Sulfonamide derivatives, and Sulfamethoxazole  Review of  Systems   Review of Systems  Constitutional:  Negative for chills and fever.  HENT:  Negative for ear pain, sore throat, trouble swallowing and voice change.   Eyes:  Negative for pain and visual disturbance.  Respiratory:  Negative for cough and shortness of breath.   Cardiovascular:  Negative for chest pain and palpitations.  Gastrointestinal:  Negative for abdominal pain and vomiting.  Genitourinary:  Negative for dysuria and hematuria.  Musculoskeletal:  Negative for arthralgias and back pain.  Skin:  Negative for color change and rash.  Neurological:  Negative for seizures and syncope.  All other systems reviewed and are negative.  Physical Exam Updated Vital Signs BP (!) 173/87 (BP Location: Right Arm)   Pulse 97   Temp 99.7 F (37.6 C) (Oral)   Resp 17   Ht 5\' 5"  (1.651 m)   Wt 89.9 kg   SpO2 98%   BMI 32.98 kg/m   Physical Exam Vitals and nursing note reviewed.  Constitutional:      Appearance: She is well-developed.  HENT:     Head: Normocephalic and atraumatic.     Jaw: There is normal jaw occlusion.     Mouth/Throat:     Mouth: Mucous membranes are dry.     Comments: The tongue is dry and fissured.  It appears to be normal in size.  No airway obstruction.  Handling secretions.  No other oropharyngeal lesions noted. Cardiovascular:     Rate and Rhythm: Normal rate and regular rhythm.     Heart sounds: Normal heart sounds.  Pulmonary:     Effort: Pulmonary effort is normal. No tachypnea.     Breath sounds: Normal breath sounds.  Musculoskeletal:     Right lower  leg: No edema.     Left lower leg: No edema.  Skin:    General: Skin is warm and dry.  Neurological:     General: No focal deficit present.     Mental Status: She is alert and oriented to person, place, and time.  Psychiatric:        Mood and Affect: Mood normal.        Behavior: Behavior normal.    ED Results / Procedures / Treatments   Labs (all labs ordered are listed, but only abnormal  results are displayed) Labs Reviewed - No data to display  EKG None  Radiology No results found.  Procedures Procedures   Medications Ordered in ED Medications - No data to display  ED Course  I have reviewed the triage vital signs and the nursing notes.  Pertinent labs & imaging results that were available during my care of the patient were reviewed by me and considered in my medical decision making (see chart for details).    MDM Rules/Calculators/A&P                           PICCOLA ARICO was discharged today from the hospital and was sent over for possible angioedema of her tongue.  Exam is not consistent with angioedema.  Vital signs are close to her discharge vital signs.  She is chronically ill, but she does not appear to be in any acute distress.  She did pass a swallow screen.  I think she is suitable for discharge back to her skilled nursing facility.  She should follow-up with oncology as scheduled. Final Clinical Impression(s) / ED Diagnoses Final diagnoses:  Lung mass  Type 2 diabetes mellitus without complication, without long-term current use of insulin (HCC)  Chronic obstructive pulmonary disease, unspecified COPD type (HCC)  Chronic diastolic CHF (congestive heart failure) ([place])    Rx / DC Orders ED Discharge Orders     None        Arnaldo Natal, MD 03/12/21 [phone]

## 2021-03-12 NOTE — TOC Transition Note (Signed)
Transition of Care [facility]) - CM/SW Discharge Note   Patient Details  Name: ADDILEE NEU MRN: [id number] Date of Birth: 24-Oct-1945  [facility] CM/SW Contact:  Lynnell Catalan, RN Phone Number: 03/12/2021, 1:39 PM   Clinical Narrative:     Pt to dc to [facility] today. [facility] contacted for transport. Yellow DNR on the chart. RN to call report to [phone].    Barriers to Discharge: Continued Medical Work up   Patient Goals and CMS Choice   CMS Medicare.gov Compare Post Acute Care list provided to:: Patient Represenative (must comment) (Husband) Choice offered to / list presented to : Spouse  Discharge Plan and Services   Discharge Planning Services: CM Consult Post Acute Care Choice: Skilled Nursing Facility                               Social Determinants of Health (SDOH) Interventions     Readmission Risk Interventions No flowsheet data found.

## 2021-03-12 NOTE — ED Triage Notes (Addendum)
Pt BIB EMS. Pt was d/c from upstairs today and taken to her new nursing facility [facility]. When getting assessment nurse noticed patient tongue was swollen and assumed the pt was having a allergic reaction to amoxicillin that was given to her for PNA. 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 Pt in no distress at this time.   BP- 180/100

## 2021-03-14 LAB — CULTURE, BLOOD (ROUTINE X 2)
Culture: NO GROWTH
Culture: NO GROWTH
Special Requests: ADEQUATE

## 2021-04-21 DEATH — deceased

## 2021-06-26 ENCOUNTER — Ambulatory Visit: Payer: Medicare Other | Admitting: Cardiovascular Disease
